# Patient Record
Sex: Female | Born: 1935 | Race: White | Hispanic: No | Smoking: Never smoker
Health system: Southern US, Community
[De-identification: ages and names within clinical notes are randomized; demographics above are authoritative.]

## PROBLEM LIST (undated history)

## (undated) DIAGNOSIS — R42 Dizziness and giddiness: Secondary | ICD-10-CM

## (undated) DIAGNOSIS — G629 Polyneuropathy, unspecified: Secondary | ICD-10-CM

## (undated) DIAGNOSIS — K2970 Gastritis, unspecified, without bleeding: Secondary | ICD-10-CM

## (undated) DIAGNOSIS — M810 Age-related osteoporosis without current pathological fracture: Secondary | ICD-10-CM

## (undated) DIAGNOSIS — R1310 Dysphagia, unspecified: Secondary | ICD-10-CM

## (undated) DIAGNOSIS — B9681 Helicobacter pylori [H. pylori] as the cause of diseases classified elsewhere: Secondary | ICD-10-CM

## (undated) DIAGNOSIS — B3781 Candidal esophagitis: Secondary | ICD-10-CM

## (undated) HISTORY — DX: Helicobacter pylori (H. pylori) as the cause of diseases classified elsewhere: B96.81

## (undated) HISTORY — PX: PARTIAL HYSTERECTOMY: SHX80

## (undated) HISTORY — DX: Candidal esophagitis: B37.81

## (undated) HISTORY — DX: Polyneuropathy, unspecified: G62.9

## (undated) HISTORY — DX: Dizziness and giddiness: R42

## (undated) HISTORY — DX: Age-related osteoporosis without current pathological fracture: M81.0

## (undated) HISTORY — DX: Gastritis, unspecified, without bleeding: K29.70

---

## 2000-01-02 ENCOUNTER — Ambulatory Visit (HOSPITAL_COMMUNITY): Admission: RE | Admit: 2000-01-02 | Discharge: 2000-01-02 | Payer: Self-pay | Admitting: Pulmonary Disease

## 2000-08-13 ENCOUNTER — Encounter: Admission: RE | Admit: 2000-08-13 | Discharge: 2000-08-13 | Payer: Self-pay | Admitting: Pulmonary Disease

## 2001-04-03 ENCOUNTER — Ambulatory Visit (HOSPITAL_COMMUNITY): Admission: RE | Admit: 2001-04-03 | Discharge: 2001-04-03 | Payer: Self-pay | Admitting: Pulmonary Disease

## 2001-08-05 ENCOUNTER — Ambulatory Visit (HOSPITAL_COMMUNITY): Admission: RE | Admit: 2001-08-05 | Discharge: 2001-08-05 | Payer: Self-pay | Admitting: Pulmonary Disease

## 2002-09-09 ENCOUNTER — Ambulatory Visit (HOSPITAL_COMMUNITY): Admission: RE | Admit: 2002-09-09 | Discharge: 2002-09-09 | Payer: Self-pay | Admitting: Pulmonary Disease

## 2004-08-31 ENCOUNTER — Ambulatory Visit (HOSPITAL_COMMUNITY): Admission: RE | Admit: 2004-08-31 | Discharge: 2004-08-31 | Payer: Self-pay | Admitting: Pulmonary Disease

## 2005-09-08 ENCOUNTER — Ambulatory Visit (HOSPITAL_COMMUNITY): Admission: RE | Admit: 2005-09-08 | Discharge: 2005-09-08 | Payer: Self-pay | Admitting: Pulmonary Disease

## 2006-09-26 ENCOUNTER — Ambulatory Visit (HOSPITAL_COMMUNITY): Admission: RE | Admit: 2006-09-26 | Discharge: 2006-09-26 | Payer: Self-pay | Admitting: Pulmonary Disease

## 2007-10-16 ENCOUNTER — Ambulatory Visit (HOSPITAL_COMMUNITY): Admission: RE | Admit: 2007-10-16 | Discharge: 2007-10-16 | Payer: Self-pay | Admitting: Pulmonary Disease

## 2008-07-15 ENCOUNTER — Ambulatory Visit (HOSPITAL_COMMUNITY): Admission: RE | Admit: 2008-07-15 | Discharge: 2008-07-15 | Payer: Self-pay | Admitting: Pulmonary Disease

## 2008-12-28 ENCOUNTER — Ambulatory Visit (HOSPITAL_COMMUNITY): Admission: RE | Admit: 2008-12-28 | Discharge: 2008-12-28 | Payer: Self-pay | Admitting: Pulmonary Disease

## 2010-06-02 ENCOUNTER — Ambulatory Visit (HOSPITAL_COMMUNITY): Admission: RE | Admit: 2010-06-02 | Discharge: 2010-06-02 | Payer: Self-pay | Admitting: Ophthalmology

## 2010-10-11 ENCOUNTER — Ambulatory Visit (HOSPITAL_COMMUNITY)
Admission: RE | Admit: 2010-10-11 | Discharge: 2010-10-11 | Payer: Self-pay | Source: Home / Self Care | Admitting: Pulmonary Disease

## 2010-12-03 ENCOUNTER — Encounter: Payer: Self-pay | Admitting: General Surgery

## 2011-01-29 LAB — HEMOGLOBIN AND HEMATOCRIT, BLOOD: Hemoglobin: 13.2 g/dL (ref 12.0–15.0)

## 2011-01-29 LAB — BASIC METABOLIC PANEL
BUN: 20 mg/dL (ref 6–23)
Creatinine, Ser: 0.92 mg/dL (ref 0.4–1.2)
GFR calc non Af Amer: 58 mL/min — ABNORMAL LOW (ref >60)

## 2011-10-23 ENCOUNTER — Telehealth: Payer: Self-pay

## 2011-10-23 NOTE — Telephone Encounter (Signed)
LMOM for pt to call. She called back and told Soledad Gerlach she did not want to have colonoscopy at this time, she will call when she is ready.

## 2014-10-09 ENCOUNTER — Other Ambulatory Visit (HOSPITAL_COMMUNITY): Payer: Self-pay | Admitting: Pulmonary Disease

## 2014-10-09 DIAGNOSIS — Z0000 Encounter for general adult medical examination without abnormal findings: Secondary | ICD-10-CM

## 2014-10-12 ENCOUNTER — Ambulatory Visit (HOSPITAL_COMMUNITY)
Admission: RE | Admit: 2014-10-12 | Discharge: 2014-10-12 | Disposition: A | Discharge status: Home / Self Care | Payer: Medicare Other | Source: Ambulatory Visit | Attending: Pulmonary Disease | Admitting: Pulmonary Disease

## 2014-10-12 DIAGNOSIS — Z0000 Encounter for general adult medical examination without abnormal findings: Secondary | ICD-10-CM | POA: Insufficient documentation

## 2015-01-11 ENCOUNTER — Encounter: Payer: Self-pay | Admitting: Gastroenterology

## 2015-01-27 ENCOUNTER — Encounter: Payer: Self-pay | Admitting: Gastroenterology

## 2015-01-27 ENCOUNTER — Other Ambulatory Visit: Payer: Self-pay

## 2015-01-27 ENCOUNTER — Ambulatory Visit (INDEPENDENT_AMBULATORY_CARE_PROVIDER_SITE_OTHER): Payer: Medicare Other | Admitting: Gastroenterology

## 2015-01-27 VITALS — BP 103/59 | HR 81 | Temp 97.0°F | Ht 65.0 in | Wt 124.0 lb

## 2015-01-27 DIAGNOSIS — R1314 Dysphagia, pharyngoesophageal phase: Secondary | ICD-10-CM

## 2015-01-27 NOTE — Patient Instructions (Signed)
We have scheduled you for an upper endoscopy and possible dilation with Dr. Darrick PennaFields in the near future!

## 2015-01-27 NOTE — Progress Notes (Signed)
Primary Care Physician:  Fredirick MaudlinHAWKINS,EDWARD L, MD Primary Gastroenterologist:  Dr. Darrick PennaFields   Chief Complaint  Patient presents with  . Dysphagia    x several months    HPI:   [name] is a 81 y.o. female presenting today at the request of Dr. Juanetta GoslingHawkins secondary to dysphagia.   Sympton onset for about a month. States 4 weeks ago noted worsening solid food dysphagia. Thinks she may have had some symptoms chronically but just ignored it. Feels like in the afternoon, she feels more hoarse if she keeps talking. Feels like her throat is growling in the afternoon. No reflux symptoms. Feels like if she rubs her neck it may go down better. Pill dysphagia noted as well. No odynophagia. Good appetite.    No prior colonoscopy. Declining any further lower GI evaluation. History of chronic constipation, taking prune juice as needed. No rectal bleeding.   States any time she has sedation, her vertigo acts up. Has severe nausea with sedation.    Past Medical History  Diagnosis Date  . Osteoporosis   . Vertigo   . Neuropathy     Past Surgical History  Procedure Laterality Date  . Partial hysterectomy      Current Outpatient Prescriptions  Medication Sig Dispense Refill  . alendronate (FOSAMAX) 70 MG tablet Take 70 mg by mouth once a week.  2  . gabapentin (NEURONTIN) 100 MG capsule 200 mg at bedtime.  11  . meclizine (ANTIVERT) 25 MG tablet Take 25 mg by mouth at bedtime.     No current facility-administered medications for this visit.    Allergies as of 01/27/2015  . (No Known Allergies)    Family History  Problem Relation Age of Onset  . Colon cancer Neg Hx     History   Social History  . Marital Status: Widowed    Spouse Name: N/A  . Number of Children: N/A  . Years of Education: N/A   Occupational History  . Not on file.   Social History Main Topics  . Smoking status: Never Smoker   . Smokeless tobacco: Not on file  . Alcohol Use: No  . Drug Use: Not  on file  . Sexual Activity: Not on file   Other Topics Concern  . Not on file   Social History Narrative  . No narrative on file    Review of Systems: Gen: see HPI CV: Denies chest pain, heart palpitations, peripheral edema, syncope.  Resp: Denies shortness of breath at rest or with exertion. Denies wheezing or cough.  GI: see HPI GU : Denies urinary burning, urinary frequency, urinary hesitancy MS: neuropathy Derm: Denies rash, itching, dry skin Psych: Denies depression, anxiety, memory loss, and confusion Heme: Denies bruising, bleeding, and enlarged lymph nodes.  Physical Exam: BP 103/59 mmHg  Pulse 81  Temp(Src) 97 F (36.1 C) (Oral)  Ht 5\' 5"  (1.651 m)  Wt 124 lb (56.246 kg)  BMI 20.63 kg/m2 General:   Alert and oriented. Pleasant and cooperative. Well-nourished and well-developed.  Head:  Normocephalic and atraumatic. Eyes:  Without icterus, sclera clear and conjunctiva pink.  Ears:  Normal auditory acuity. Nose:  No deformity, discharge,  or lesions. Mouth:  No deformity or lesions, oral mucosa pink.  Lungs:  Clear to auscultation bilaterally. No wheezes, rales, or rhonchi. No distress.  Heart:  S1, S2 present without murmurs appreciated.  Abdomen:  +BS, soft, non-tender and non-distended. No HSM noted. No guarding or rebound. No masses  appreciated.  Rectal:  Deferred  Msk:  Symmetrical without gross deformities. Normal posture. Extremities:  Without clubbing or edema. Neurologic:  Alert and  oriented x4;  grossly normal neurologically. Skin:  Intact without significant lesions or rashes. Psych:  Alert and cooperative. Normal mood and affect.

## 2015-01-29 ENCOUNTER — Telehealth: Payer: Self-pay

## 2015-01-29 NOTE — Telephone Encounter (Signed)
Pt called and had some questions about her procedure being with Dr. Darrick PennaFields.  States that when she made the appointment she was told that Dr. Jena Gaussourk would be doing procedures if she had to have anything done.  Pt is requesting for Dr. Jena Gaussourk to be primary GI. Pt is already scheduled with Dr. Darrick PennaFields on 02/27/15 for EGD. Please Advise

## 2015-01-29 NOTE — Telephone Encounter (Signed)
That is fine. She has not established with either one. If she desires Dr. Jena Gaussourk, put with him. Just let me know so I can change my documentation in her office note.

## 2015-01-29 NOTE — Telephone Encounter (Signed)
She is requesting Rourk so please fix the note.

## 2015-02-01 ENCOUNTER — Telehealth: Payer: Self-pay

## 2015-02-01 ENCOUNTER — Emergency Department (HOSPITAL_COMMUNITY)
Admission: EM | Admit: 2015-02-01 | Discharge: 2015-02-01 | Disposition: A | Discharge status: Home / Self Care | Payer: Medicare Other | Attending: Emergency Medicine | Admitting: Emergency Medicine

## 2015-02-01 ENCOUNTER — Encounter (HOSPITAL_COMMUNITY): Payer: Self-pay | Admitting: Emergency Medicine

## 2015-02-01 DIAGNOSIS — G629 Polyneuropathy, unspecified: Secondary | ICD-10-CM | POA: Diagnosis not present

## 2015-02-01 DIAGNOSIS — R1310 Dysphagia, unspecified: Secondary | ICD-10-CM

## 2015-02-01 DIAGNOSIS — Z8739 Personal history of other diseases of the musculoskeletal system and connective tissue: Secondary | ICD-10-CM | POA: Insufficient documentation

## 2015-02-01 DIAGNOSIS — Z79899 Other long term (current) drug therapy: Secondary | ICD-10-CM | POA: Insufficient documentation

## 2015-02-01 MED ORDER — MECLIZINE HCL 12.5 MG PO TABS
12.5000 mg | ORAL_TABLET | Freq: Once | ORAL | Status: AC
  Administered 2015-02-01: 12.5 mg via ORAL
  Filled 2015-02-01: qty 1

## 2015-02-01 MED ORDER — DIAZEPAM 5 MG PO TABS
5.0000 mg | ORAL_TABLET | Freq: Once | ORAL | Status: AC
  Administered 2015-02-01: 5 mg via ORAL
  Filled 2015-02-01: qty 1

## 2015-02-01 NOTE — ED Notes (Signed)
Upon rounding, pt remains anxious. bp has increased. Airway remains patent.

## 2015-02-01 NOTE — ED Notes (Signed)
Pt reports is scheduled to have esophagus stretched in April. Pt reports was trying to eat lunch today and pt reports "it wont go down." airway patent. Moderate anxiety noted in triage. Pt family also reports chronic inner ear.

## 2015-02-01 NOTE — ED Notes (Signed)
Pt very anxious at this time.  States that she was eating soup for lunch and feels like it is stuck in her throat.  Hyperverbal and hyperventilating.  Pt calmed down when coached through deep breathing.  Denies any trouble breathing and states that she is able to swallow saliva at this time.

## 2015-02-01 NOTE — Telephone Encounter (Signed)
Patients son called(Jerry). States that the pt wants to have procedure moved up.  States that pt is having labored breathing and is complaining about food not going down. Spoke with Gerrit HallsAnna Sams via phone and she states that pt needs to go to ER if problem is creating a breathing issue.  Son states that he also thought that she needed to go the ER before he called the office.   Procedure has been rescheduled to 02/19/2015.

## 2015-02-01 NOTE — Telephone Encounter (Signed)
Yes she is. Thanks and sorry for any confusion

## 2015-02-01 NOTE — Telephone Encounter (Signed)
Just verifying: she is staying with Dr. Darrick PennaFields, correct?

## 2015-02-01 NOTE — Discharge Instructions (Signed)

## 2015-02-02 NOTE — Progress Notes (Signed)
cc'ed to pcp °

## 2015-02-02 NOTE — Assessment & Plan Note (Signed)
81 year old female with worsening food and pill dysphagia without typical reflux symptoms. Query web, ring, stricture. May have underlying motility disorder as well. No prior EGD.   Proceed with upper endoscopy and dilation in the near future with Dr. Darrick PennaFields. The risks, benefits, and alternatives have been discussed in detail with patient. They have stated understanding and desire to proceed.  Patient notes significant nausea with history of sedation. Zofran 4 mg IV on call for procedure.

## 2015-02-03 ENCOUNTER — Telehealth: Payer: Self-pay | Admitting: Gastroenterology

## 2015-02-03 NOTE — Telephone Encounter (Signed)
PATIENT CALLED TO SEE IF SHE COULD MOVE HER ENDO EARLIER  PLEASE CALL [phone]

## 2015-02-03 NOTE — Telephone Encounter (Signed)
Called pt and offered and earlier date on 02/15/2015 @ 1:30pm but pt refused.

## 2015-02-06 NOTE — ED Provider Notes (Signed)
CSN: [id number]     Arrival date & time 02/01/15  1641 History   First MD Initiated Contact with Patient 02/01/15 1919     Chief Complaint  Patient presents with  . Dysphagia     (Consider location/radiation/quality/duration/timing/severity/associated sxs/prior Treatment) HPI   81 year old female with dysphasia. Ongoing symptoms for the past several weeks. Today she is trying eat lunch and reports that "nothing would go down." Persistent symptoms since then. Denies any pain. No shortness of breath. Reports upcoming GI appointment for further evaluation. NO coughing. No nausea.   Past Medical History  Diagnosis Date  . Osteoporosis   . Vertigo   . Neuropathy    Past Surgical History  Procedure Laterality Date  . Partial hysterectomy     Family History  Problem Relation Age of Onset  . Colon cancer Neg Hx    History  Substance Use Topics  . Smoking status: Never Smoker   . Smokeless tobacco: Not on file  . Alcohol Use: No   OB History    No data available     Review of Systems  All systems reviewed and negative, other than as noted in HPI.   Allergies  Review of patient's allergies indicates no known allergies.  Home Medications   Prior to Admission medications   Medication Sig Start Date End Date Taking? Authorizing Provider  alendronate (FOSAMAX) 70 MG tablet Take 70 mg by mouth once a week. 12/11/14   Historical Provider, MD  gabapentin (NEURONTIN) 100 MG capsule 200 mg at bedtime. 01/01/15   Historical Provider, MD  meclizine (ANTIVERT) 25 MG tablet Take 25 mg by mouth at bedtime.    Historical Provider, MD   BP 104/81 mmHg  Pulse 83  Temp(Src) 98.2 F (36.8 C) (Oral)  Resp 24  Ht 5\' 5"  (1.651 m)  Wt 124 lb (56.246 kg)  BMI 20.63 kg/m2  SpO2 95% Physical Exam  Constitutional: She appears well-developed and well-nourished. No distress.  HENT:  Head: Normocephalic and atraumatic.  Mouth/Throat: Oropharynx is clear and moist. No oropharyngeal exudate.   Eyes: Conjunctivae are normal. Pupils are equal, round, and reactive to light. Right eye exhibits no discharge. Left eye exhibits no discharge.  Neck: Neck supple.  Cardiovascular: Normal rate, regular rhythm and normal heart sounds.  Exam reveals no gallop and no friction rub.   No murmur heard. Pulmonary/Chest: Effort normal and breath sounds normal. No respiratory distress.  Abdominal: Soft. She exhibits no distension. There is no tenderness.  Musculoskeletal: She exhibits no edema or tenderness.  Lymphadenopathy:    She has no cervical adenopathy.  Neurological: She is alert.  Skin: Skin is warm and dry.  Psychiatric: She has a normal mood and affect. Her behavior is normal. Thought content normal.  Nursing note and vitals reviewed.   ED Course  Procedures (including critical care time) Labs Review Labs Reviewed - No data to display  Imaging Review No results found.   EKG Interpretation None      MDM   Final diagnoses:  Dysphagia   81 year old female with dysphasia. Suspect surrounding sciatic component. She is tolerating crackers and ginger ale emergency room. She denies any pain. No respiratory distress.    Raeford RazorStephen Shihab States, MD 02/06/15 2320

## 2015-02-12 DIAGNOSIS — B3781 Candidal esophagitis: Secondary | ICD-10-CM

## 2015-02-12 DIAGNOSIS — B9681 Helicobacter pylori [H. pylori] as the cause of diseases classified elsewhere: Secondary | ICD-10-CM

## 2015-02-12 HISTORY — DX: Candidal esophagitis: B37.81

## 2015-02-12 HISTORY — DX: Helicobacter pylori (H. pylori) as the cause of diseases classified elsewhere: B96.81

## 2015-02-19 ENCOUNTER — Ambulatory Visit (HOSPITAL_COMMUNITY)
Admission: RE | Admit: 2015-02-19 | Discharge: 2015-02-19 | Disposition: A | Discharge status: Home / Self Care | Payer: Medicare Other | Source: Ambulatory Visit | Attending: Gastroenterology | Admitting: Gastroenterology

## 2015-02-19 ENCOUNTER — Encounter (HOSPITAL_COMMUNITY)
Admission: RE | Disposition: A | Discharge status: Home / Self Care | Payer: Self-pay | Source: Ambulatory Visit | Attending: Gastroenterology

## 2015-02-19 ENCOUNTER — Encounter (HOSPITAL_COMMUNITY): Payer: Self-pay | Admitting: *Deleted

## 2015-02-19 DIAGNOSIS — M810 Age-related osteoporosis without current pathological fracture: Secondary | ICD-10-CM | POA: Insufficient documentation

## 2015-02-19 DIAGNOSIS — K228 Other specified diseases of esophagus: Secondary | ICD-10-CM | POA: Insufficient documentation

## 2015-02-19 DIAGNOSIS — B9681 Helicobacter pylori [H. pylori] as the cause of diseases classified elsewhere: Secondary | ICD-10-CM | POA: Insufficient documentation

## 2015-02-19 DIAGNOSIS — K209 Esophagitis, unspecified: Secondary | ICD-10-CM | POA: Diagnosis not present

## 2015-02-19 DIAGNOSIS — K2970 Gastritis, unspecified, without bleeding: Secondary | ICD-10-CM | POA: Diagnosis not present

## 2015-02-19 DIAGNOSIS — R1310 Dysphagia, unspecified: Secondary | ICD-10-CM | POA: Diagnosis present

## 2015-02-19 DIAGNOSIS — K2950 Unspecified chronic gastritis without bleeding: Secondary | ICD-10-CM | POA: Diagnosis not present

## 2015-02-19 DIAGNOSIS — R1314 Dysphagia, pharyngoesophageal phase: Secondary | ICD-10-CM | POA: Diagnosis not present

## 2015-02-19 DIAGNOSIS — R634 Abnormal weight loss: Secondary | ICD-10-CM | POA: Diagnosis not present

## 2015-02-19 HISTORY — PX: ESOPHAGOGASTRODUODENOSCOPY: SHX5428

## 2015-02-19 HISTORY — PX: ESOPHAGEAL DILATION: SHX303

## 2015-02-19 SURGERY — EGD (ESOPHAGOGASTRODUODENOSCOPY)
Anesthesia: Moderate Sedation

## 2015-02-19 MED ORDER — STERILE WATER FOR IRRIGATION IR SOLN
Status: DC | PRN
  Administered 2015-02-19: 11:00:00

## 2015-02-19 MED ORDER — MEPERIDINE HCL 100 MG/ML IJ SOLN
INTRAMUSCULAR | Status: DC | PRN
  Administered 2015-02-19 (×2): 25 mg via INTRAVENOUS

## 2015-02-19 MED ORDER — MIDAZOLAM HCL 5 MG/5ML IJ SOLN
INTRAMUSCULAR | Status: DC | PRN
  Administered 2015-02-19 (×3): 1 mg via INTRAVENOUS

## 2015-02-19 MED ORDER — MIDAZOLAM HCL 5 MG/5ML IJ SOLN
INTRAMUSCULAR | Status: AC
  Filled 2015-02-19: qty 10

## 2015-02-19 MED ORDER — LIDOCAINE VISCOUS 2 % MT SOLN
OROMUCOSAL | Status: DC | PRN
  Administered 2015-02-19: 4 mL via OROMUCOSAL

## 2015-02-19 MED ORDER — SODIUM CHLORIDE 0.9 % IV SOLN
INTRAVENOUS | Status: DC
  Administered 2015-02-19: 10:00:00 via INTRAVENOUS

## 2015-02-19 MED ORDER — LIDOCAINE VISCOUS 2 % MT SOLN
OROMUCOSAL | Status: AC
  Filled 2015-02-19: qty 15

## 2015-02-19 MED ORDER — MEPERIDINE HCL 100 MG/ML IJ SOLN
INTRAMUSCULAR | Status: AC
  Filled 2015-02-19: qty 2

## 2015-02-19 MED ORDER — ONDANSETRON HCL 4 MG/2ML IJ SOLN
4.0000 mg | Freq: Once | INTRAMUSCULAR | Status: AC
  Administered 2015-02-19: 4 mg via INTRAVENOUS
  Filled 2015-02-19: qty 2

## 2015-02-19 MED ORDER — MINERAL OIL PO OIL
TOPICAL_OIL | ORAL | Status: AC
  Filled 2015-02-19: qty 30

## 2015-02-19 NOTE — Discharge Instructions (Signed)
I dilated your esophagus. You have a stricture near the TOP of your esophagus.  You have mild gastritis. I biopsied your ESOPHAGUS AND stomach.   YOUR BIOPSY RESULTS WILL BE AVAILABLE IN MY CHART AFTER APR 12 AND MY OFFICE WILL CONTACT YOU IN 10-14 DAYS WITH YOUR RESULTS.   FOLLOW UP IN 3 MOS.  APPOINTMENT  May 21, 2015 AT 10:00 AM, WITH Gerrit HallsANNA SAMS, NP  UPPER ENDOSCOPY AFTER CARE Read the instructions outlined below and refer to this sheet in the next week. These discharge instructions provide you with general information on caring for yourself after you leave the hospital. While your treatment has been planned according to the most current medical practices available, unavoidable complications occasionally occur. If you have any problems or questions after discharge, call DR. FIELDS, [phone].  ACTIVITY  You may resume your regular activity, but move at a slower pace for the next 24 hours.   Take frequent rest periods for the next 24 hours.   Walking will help get rid of the air and reduce the bloated feeling in your belly (abdomen).   No driving for 24 hours (because of the medicine (anesthesia) used during the test).   You may shower.   Do not sign any important legal documents or operate any machinery for 24 hours (because of the anesthesia used during the test).    NUTRITION  Drink plenty of fluids.   You may resume your normal diet as instructed by your doctor.   Begin with a light meal and progress to your normal diet. Heavy or fried foods are harder to digest and may make you feel sick to your stomach (nauseated).   Avoid alcoholic beverages for 24 hours or as instructed.    MEDICATIONS  You may resume your normal medications.   WHAT YOU CAN EXPECT TODAY  Some feelings of bloating in the abdomen.   Passage of more gas than usual.    IF YOU HAD A BIOPSY TAKEN DURING THE UPPER ENDOSCOPY:  Eat a soft diet IF YOU HAVE NAUSEA, BLOATING, ABDOMINAL PAIN, OR  VOMITING.    FINDING OUT THE RESULTS OF YOUR TEST Not all test results are available during your visit. DR. Darrick PennaFIELDS WILL CALL YOU WITHIN 14 DAYS OF YOUR PROCEDUE WITH YOUR RESULTS. Do not assume everything is normal if you have not heard from DR. FIELDS, CALL HER OFFICE AT [phone].  SEEK IMMEDIATE MEDICAL ATTENTION AND CALL THE OFFICE: [phone] IF:  You have more than a spotting of blood in your stool.   Your belly is swollen (abdominal distention).   You are nauseated or vomiting.   You have a temperature over 101F.   You have abdominal pain or discomfort that is severe or gets worse throughout the day.  Gastritis  Gastritis is an inflammation (the body's way of reacting to injury and/or infection) of the stomach. It is often caused by viral or bacterial (germ) infections. It can also be caused BY ASPIRIN, BC/GOODY POWDER'S, (IBUPROFEN) MOTRIN, OR ALEVE (NAPROXEN), chemicals (including alcohol), SPICY FOODS, and medications. This illness may be associated with generalized malaise (feeling tired, not well), UPPER ABDOMINAL STOMACH cramps, and fever. One common bacterial cause of gastritis is an organism known as H. Pylori. This can be treated with antibiotics.   ESOPHAGEAL STRICTURE  Esophageal strictures can be caused by stomach acid backing up into the tube that carries food from the mouth down to the stomach (lower esophagus).  TREATMENT There are a number of non-prescription medicines  used to treat reflux/stricture, including: Antacids.  ZANTAC OR PEPCID  HOME CARE INSTRUCTIONS Eat 2-3 hours before going to bed.  Try to reach and maintain a healthy weight.  Do not eat just a few very large meals. Instead, eat 4 TO 6 smaller meals throughout the day.  Try to identify foods and beverages that make your symptoms worse, and avoid these.  Avoid tight clothing.  Do not exercise right after eating.

## 2015-02-19 NOTE — Op Note (Signed)
[facility] [address] [place], [id number]   ENDOSCOPY PROCEDURE REPORT  PATIENT: Nancy Horn, Nancy Horn  MR#: [id number] BIRTHDATE: 13-Jan-1925 , 89  yrs. old GENDER: female  ENDOSCOPIST: West BaliSandi L Dolorez Jeffrey, MD REFFERED [id number]:Edward Juanetta GoslingHawkins, M.D.  PROCEDURE DATE:  02/19/2015 PROCEDURE:   EGD with biopsy and EGD with dilatation over guidewire   INDICATIONS:1.  dysphagia.   2.  weight loss. MEDICATIONS: Demerol 50 mg IV and Versed 3 mg IV TOPICAL ANESTHETIC: Viscous Xylocaine  DESCRIPTION OF PROCEDURE:   After the risks benefits and alternatives of the procedure were thoroughly explained, informed consent was obtained.  The [id number] ([id number]  endoscope was introduced through the mouth and advanced to the second portion of the duodenum. The instrument was slowly withdrawn as the mucosa was carefully examined.  Prior to withdrawal of the scope, the guidwire was placed.  The esophagus was dilated successfully.  The patient was recovered in endoscopy and discharged home in satisfactory condition.   ESOPHAGUS: DILATED PROXIMAL ESOPHAGUS.  SEGMENT IS 5 CM LONG.  COLD BIOPSIES OBTAINED.   POSSIBLE MID-ESOPHAGEAL WEB.  NO MASS DETECTED.  NORMAL GE JUNCTION.   STOMACH: Mild non-erosive gastritis (inflammation) was found in the gastric antrum.  Multiple biopsies were performed.   DUODENUM: The duodenal mucosa showed no abnormalities in the bulb and second portion of the duodenum. Dilation was then performed at the gastroesphageal junction Dilator: Savary over guidewire Size(s): 12-16 MM Resistance: minimal Heme: yes  COMPLICATIONS: There were no immediate complications.  ENDOSCOPIC IMPRESSION: 1.   DILATED PROXIMAL ESOPHAGUS. 2.   POSSIBLE MID-ESOPHAGEAL WEB. 3.   MILD Non-erosive gastritis  RECOMMENDATIONS: AWAIT BIOPSY. FOLLOW UP IN 3 MOS.   eSigned:  West BaliSandi L Stefanos Haynesworth, MD 02/19/2015 3:15 PM   CPT CODES: ICD CODES:  The ICD and CPT codes recommended by this  software are interpretations from the data that the clinical staff has captured with the software.  The verification of the translation of this report to the ICD and CPT codes and modifiers is the sole responsibility of the health care institution and practicing physician where this report was generated.  [employer] will not be held responsible for the validity of the ICD and CPT codes included on this report.  AMA assumes no liability for data contained or not contained herein. CPT is a Publishing rights managerregistered trademark of the [employer].

## 2015-02-19 NOTE — H&P (Signed)
  Primary Care Physician:  Fredirick MaudlinHAWKINS,EDWARD L, MD Primary Gastroenterologist:  Dr. Darrick PennaFields  Pre-Procedure History & Physical: HPI:  Nancy RakeDorothy P Horn is a 81 y.o. female here for DYSPHAGIA.  Past Medical History  Diagnosis Date  . Osteoporosis   . Vertigo   . Neuropathy     Past Surgical History  Procedure Laterality Date  . Partial hysterectomy      Prior to Admission medications   Medication Sig Start Date End Date Taking? Authorizing Provider  alendronate (FOSAMAX) 70 MG tablet Take 70 mg by mouth once a week. 12/11/14  Yes Historical Provider, MD  gabapentin (NEURONTIN) 100 MG capsule 200 mg at bedtime. 01/01/15  Yes Historical Provider, MD  meclizine (ANTIVERT) 25 MG tablet Take 25 mg by mouth at bedtime.   Yes Historical Provider, MD    Allergies as of 01/27/2015  . (No Known Allergies)    Family History  Problem Relation Age of Onset  . Colon cancer Neg Hx     History   Social History  . Marital Status: Widowed    Spouse Name: N/A  . Number of Children: N/A  . Years of Education: N/A   Occupational History  . Not on file.   Social History Main Topics  . Smoking status: Never Smoker   . Smokeless tobacco: Not on file  . Alcohol Use: No  . Drug Use: No  . Sexual Activity: Not on file   Other Topics Concern  . Not on file   Social History Narrative    Review of Systems: See HPI, otherwise negative ROS   Physical Exam: BP 137/65 mmHg  Pulse 80  Temp(Src) 97.3 F (36.3 C) (Oral)  Resp 24  Ht 5\' 5"  (1.651 m)  Wt 124 lb (56.246 kg)  BMI 20.63 kg/m2  SpO2 98% General:   Alert,  pleasant and cooperative in NAD Head:  Normocephalic and atraumatic. Neck:  Supple; Lungs:  Clear throughout to auscultation.    Heart:  Regular rate and rhythm. Abdomen:  Soft, nontender and nondistended. Normal bowel sounds, without guarding, and without rebound.   Neurologic:  Alert and  oriented x4;  grossly normal neurologically.  Impression/Plan:      DYSPHAGIA  PLAN:  EGD/DIL TODAY

## 2015-02-19 NOTE — Progress Notes (Signed)
REVIEWED-NO ADDITIONAL RECOMMENDATIONS. 

## 2015-02-23 ENCOUNTER — Encounter (HOSPITAL_COMMUNITY): Payer: Self-pay | Admitting: Gastroenterology

## 2015-02-25 ENCOUNTER — Telehealth: Payer: Self-pay | Admitting: General Practice

## 2015-02-25 ENCOUNTER — Encounter: Payer: Self-pay | Admitting: General Practice

## 2015-02-25 MED ORDER — FLUCONAZOLE 100 MG PO TABS: ORAL_TABLET | ORAL | Status: DC

## 2015-02-25 MED ORDER — PANTOPRAZOLE SODIUM 40 MG PO TBEC
DELAYED_RELEASE_TABLET | ORAL | Status: DC
Start: 2015-02-25 — End: 2015-03-31

## 2015-02-25 NOTE — Telephone Encounter (Signed)
I spoke to pt. She had EGD on 02/19/2015 by Dr. Darrick PennaFields.  She is aware we call with results in 10-14 days, but said "I do not want to wait 10-14 days for results/recommendations.  She is having problems with saliva in her mouth when she talks a lot, her mouth gets full.( said this was a fairly new problem).  She was speaking to a relative who mentioned the flap and she wants to know if there is anything wrong with her flap.  She would like to know if she will ALWAYS need to eat small portions. Said she has no appetite and food just does not appeal to her. She is concerned that she will be losing weight if she cannot eat much.    She ask if she should take an antacid. Said she has a lot of gas. She is also constipated now and just does a little bit everyday. Tries drinking prune juice some, but feels she needs an enema.   She told Nancy Horn that she wanted to speak to Dr. Darrick PennaFields now, and she told her Dr. Darrick PennaFields is with patients.  Pt is aware I will call her when I get the message from Dr. Darrick PennaFields.

## 2015-02-25 NOTE — Telephone Encounter (Signed)
Routing to Nancy Horn, patient feels like she is having complications from her procedure

## 2015-02-25 NOTE — Telephone Encounter (Signed)
I LMOM to call. ( Rx for the [employer] printed and I faxed it to [facility]).

## 2015-02-25 NOTE — Telephone Encounter (Addendum)
PLEASE CALL PT. HER ESOPHAGUS BIOPSIES SHOWS CANDIDA ESOPHAGITIS. SHE HAS A YEATS INFECTION IN HER ESOPHAGUS THAT IS CAUSING HER TO HAVE TROUBLE SWALLOWING. SHE NEEDS DIFLUCAN 2 FOR THE FIRST DOSE THEN ONE DAILY FOR 21 DAYS. SHE NEEDS TO FOLLOW A SOFT MECHANICAL DIET UNTIL HER FOLLOW UP APPT. SHE NEEDS TO START PROTONIX BID.   SHE SHOULD TAKE MILK OF MAGNESIA LIQUID OR PILLS TWICE DAILY TO TREAT HER CONSTIPATION.  Her stomach Bx showed H. 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 AFTER SHE FINISHES THE DIFLUCAN, SHE NEEDS TO CALL US AND WE WILL SEND HER RX FOR ABX FOR THE BACTERIAL INFECTION. THE MEDICINES TO TREAT H PYLORI AND CANDIDA ESOPHAGITIS CAN'T BE TAKEN TOGETHER. HER APPETITE AND GAS WILL GET BETTER AFTER BOTH INFECTIONS ARE TREATED.

## 2015-02-25 NOTE — Addendum Note (Signed)
Addended by: West BaliFIELDS, Kaiyon Hynes L on: 02/25/2015 03:24 PM   Modules accepted: Orders

## 2015-02-26 NOTE — Telephone Encounter (Signed)
I called and informed the pt. She is aware she also has the H Pylori and will call back after she completes the Diflucan. I also have a reminder to call her about that time also.

## 2015-03-09 ENCOUNTER — Telehealth: Payer: Self-pay | Admitting: Gastroenterology

## 2015-03-09 NOTE — Telephone Encounter (Signed)
Patient called and asked if the nurse would call her back because she is still having problems since having her procedure on 02/19/15 and has questions she wouldn't to ask her. Please call [phone]

## 2015-03-09 NOTE — Telephone Encounter (Signed)
I called pt and she is still complaining with her throat filling up quickly and it sounds like she is hoarse.  I went over all of her instructions from 02/25/2015 with her .She is taking the Diflucan for the candida esophagitis. I asked her if she is following a soft mechanical diet and she said she is trying to although she did have a chicken tender yesterday with mashed potatoes.   She will come by the office for a soft mechanical diet which I am placing at front for her.   She will call when she completes Diflucan so I can give her the instructions for her H Pylori treatment. I also have a note for me to call her.   Fyi to Gerrit HallsAnna sams, NP in Dr. Darrick PennaFields absence.

## 2015-03-10 NOTE — Telephone Encounter (Signed)
Noted. Follow the strict mechanical diet. Continue Diflucan as you instructed. Agree.

## 2015-03-16 NOTE — Telephone Encounter (Signed)
I spoke to the pt today. She has 3 more Diflucan tablets to take 9 one for today, Wed and Thursday. She will be ready to start the H Pylori treatment on Friday. OK to send the med to her pharmacy.   She is still very discouraged. Said she is taking the Protonix bid and staying on a soft mechanical diet.  She feels like she is so full all of the time and has her mouth filling up a lot when she talks. She would like to know what Dr.Fields advise is for that.

## 2015-03-17 MED ORDER — AMOXICILLIN 500 MG PO TABS: ORAL_TABLET | ORAL | Status: DC

## 2015-03-17 MED ORDER — CLARITHROMYCIN 500 MG PO TABS: ORAL_TABLET | ORAL | Status: DC

## 2015-03-17 NOTE — Addendum Note (Signed)
Addended by: West BaliFIELDS, Joelyn Lover L on: 03/17/2015 12:17 PM   Modules accepted: Orders

## 2015-03-17 NOTE — Telephone Encounter (Addendum)
PLEASE CALL PT. SHE MAY FEEL FULL ALL THE TIME BECAUSE HER H PYLORI IS NOT TREATED SHE SHOULD COMPLETE HER ABX AND CALL 2 WEEKS AFTER THEY ARE COMPLETE.   I AM NOT SURE WHY SHE FEELS LIKE MOUTH FILLS UP. SHE SHOULD TALK TO HER DENTIST ABOUT THAT.

## 2015-03-17 NOTE — Telephone Encounter (Signed)
PLEASE CALL PT. IF SHE IS NOT HAVING TROUBLE SWALLOWING THEN IT IS OK TO TAKE FOSAMAX. SHE SHOULD FOLLOW ALL PRECAUTIONS WHEN TAKING FOSAMAX.

## 2015-03-17 NOTE — Telephone Encounter (Addendum)
PLEASE CALL PT. RX SENT. START MAY 6. She needs AMOXICILLIN 500 mg 2 po BID for 10 days and Biaxin 500 mg po bid for 10 days. She SHOULD TAKE PROTONIX BID UNTIL JUL 2016 THEN then 1 po DAILY. Med side effects include NVD, abd pain, and metallic taste.

## 2015-03-17 NOTE — Telephone Encounter (Signed)
Pt called and states that she received her fosamax in the mail and began reading the warnings and side effects.  States that the fosamax has a warning about not taking it if you have esophageal problems. Pt states she is confused and wants to make sure Tobi Bastosnna is aware.

## 2015-03-17 NOTE — Telephone Encounter (Signed)
Please send RX to the pharmacy for the H Pylori.

## 2015-03-17 NOTE — Telephone Encounter (Signed)
Pt is aware. She also said she had her Fosamax refilled and she noticed on the pamphlet that if you have problems with your esophagus, you should not take it. She said that Dr. Juanetta GoslingHawkins prescribed that for her and I told her she should discuss her concerns about the Fosamax with Dr. Juanetta GoslingHawkins.

## 2015-03-17 NOTE — Telephone Encounter (Signed)
Pt is aware.  

## 2015-03-25 ENCOUNTER — Telehealth: Payer: Self-pay

## 2015-03-25 NOTE — Telephone Encounter (Signed)
Pt called and said she is scheduled for follow up here in July with Gerrit HallsAnna Sams, NP. She would like to cancel that appt and make one to follow up with Dr. Darrick PennaFields. Please schedule and let pt know. Thanks!

## 2015-03-25 NOTE — Telephone Encounter (Signed)
PUT PATIENT ON RECALL FOR July WITH [facility] ONLY, PATIENT AWARE

## 2015-03-30 ENCOUNTER — Telehealth: Payer: Self-pay | Admitting: Gastroenterology

## 2015-03-30 NOTE — Telephone Encounter (Signed)
Patient called today to say that she has taken her protonix 40 mg and doesn't feel like it has helped her any. She said that she feels like her throat is "filling up" the more she talks. She would like for the nurse to call her back. [phone]

## 2015-03-30 NOTE — Telephone Encounter (Signed)
I called pt and she has taken the antibiotics. She said she does not feel the Protonix is helping her. She feels like the saliva is in excess in her throat. She also feels that the Protonix gets stuck sometimes. She is eating soft foods.  Please advise!

## 2015-03-31 MED ORDER — LANSOPRAZOLE 30 MG PO TBDP: ORAL_TABLET | ORAL | Status: DC

## 2015-03-31 NOTE — Telephone Encounter (Signed)
Pt is aware. OK to schedule the swallowing test.

## 2015-03-31 NOTE — Addendum Note (Signed)
Addended by: West BaliFIELDS, Nancie Bocanegra L on: 03/31/2015 01:48 PM   Modules accepted: Orders, Medications

## 2015-03-31 NOTE — Telephone Encounter (Signed)
PLEASE CALL PT. IF SHE FEELS LIKE HER THROAT IS FILLING UP WHEN SHE TALKS, SHE SHOULD HAVE A MODIFIED BARIUM SWALLOW TO SEE IF SHE HAS TROUBLE GETTING LIQUIDS INTO HER ESOPHAGUS. SHE CAN CHANGE FROM PROTONIX TO PREVACID SOLUTABS BID 30 MINS PRIOR TO MEALS.

## 2015-03-31 NOTE — Telephone Encounter (Signed)
LMOM to call.

## 2015-04-01 ENCOUNTER — Other Ambulatory Visit: Payer: Self-pay

## 2015-04-01 DIAGNOSIS — R1314 Dysphagia, pharyngoesophageal phase: Secondary | ICD-10-CM

## 2015-04-01 NOTE — Telephone Encounter (Signed)
Referral to speech for Modified Barium swallow has been made

## 2015-04-05 ENCOUNTER — Other Ambulatory Visit: Payer: Self-pay | Admitting: Gastroenterology

## 2015-04-05 ENCOUNTER — Telehealth: Payer: Self-pay

## 2015-04-05 DIAGNOSIS — R1310 Dysphagia, unspecified: Secondary | ICD-10-CM

## 2015-04-05 NOTE — Telephone Encounter (Signed)
Tried to do a PA for prevacid solutab. It was denied by her insurance. They will only pay for omeprazole qd and pantoprazole qd. They will not pay for it bid. Pt currently has pantoprazole at home and is going to take it qd unless she hears back from us. Do you want to change her to omeprazole?

## 2015-04-06 ENCOUNTER — Ambulatory Visit (HOSPITAL_COMMUNITY): Payer: Medicare Other | Attending: Gastroenterology | Admitting: Speech Pathology

## 2015-04-06 ENCOUNTER — Telehealth: Payer: Self-pay

## 2015-04-06 ENCOUNTER — Ambulatory Visit (HOSPITAL_COMMUNITY)
Admission: RE | Admit: 2015-04-06 | Discharge: 2015-04-06 | Disposition: A | Discharge status: Home / Self Care | Payer: Medicare Other | Source: Ambulatory Visit | Attending: Gastroenterology | Admitting: Gastroenterology

## 2015-04-06 DIAGNOSIS — R1314 Dysphagia, pharyngoesophageal phase: Secondary | ICD-10-CM | POA: Insufficient documentation

## 2015-04-06 DIAGNOSIS — K224 Dyskinesia of esophagus: Secondary | ICD-10-CM | POA: Diagnosis not present

## 2015-04-06 DIAGNOSIS — R1310 Dysphagia, unspecified: Secondary | ICD-10-CM

## 2015-04-06 MED ORDER — OMEPRAZOLE 20 MG PO CPDR: 20.0000 mg | DELAYED_RELEASE_CAPSULE | Freq: Every day | ORAL | Status: DC

## 2015-04-06 NOTE — Telephone Encounter (Signed)
Appears there is two issues.   She needed BID for the H.pylori treatment, has she even completed this? We can switch her to omeprazole 20mg  (she can empty contents of capsule into applesauce and swallow if she is having trouble with swallow pills). We can try BID but if not covered by insurance then she may have to supplement with over the counter omeprazole to get total of BID.

## 2015-04-06 NOTE — Telephone Encounter (Signed)
03/30/15 telephone note. [facility] changed pt to prevacid solutab.

## 2015-04-06 NOTE — Telephone Encounter (Signed)
Raynelle FanningJulie, this lady has a lot of correspondence in the note which I briefly reviewed. I have never seen her before. Help me figure out where the prevacid solutab came into play.

## 2015-04-06 NOTE — Telephone Encounter (Signed)
Pt has finished her hpylori medication. She is aware of new rx but doesn't want to start it yet until she gets her results from [facility] about the swallowing test.  Routing to Dr.Fields for test results.

## 2015-04-06 NOTE — Telephone Encounter (Signed)
Daphey with the speech department call wanting to talk with [facility] about this patients barium swallow test. I inform her that [facility] was out of town but I would pay it on to [facility]. She would like for someone to call her on her cell phone [phone])

## 2015-04-07 NOTE — Telephone Encounter (Signed)
Attempted to call but had to leave message.

## 2015-04-08 ENCOUNTER — Other Ambulatory Visit: Payer: Self-pay

## 2015-04-08 DIAGNOSIS — R1314 Dysphagia, pharyngoesophageal phase: Secondary | ICD-10-CM

## 2015-04-08 NOTE — Telephone Encounter (Signed)
Pt is set up for BPE on 04/15/15 @ 915. LMOM for her to call us back

## 2015-04-08 NOTE — Telephone Encounter (Signed)
Discussed with Nancy Horn regarding results. Patient with impressively dilated esophagus, distended and full of air prior even prior to beginning MBSS. I was not able to speak to Dr. Kearney Hardover, who was present for this study, but I did speak with Dr. Rosalene Billingsegister. The best way to further evaluate her anatomy would be with a BPE.   Please schedule BPE. Thanks!

## 2015-04-09 NOTE — Telephone Encounter (Signed)
Pt could no do the 04/15/15 so her new appointment is 04/13/15 @ 9:00. She is aware

## 2015-04-09 NOTE — Telephone Encounter (Signed)
Open in error

## 2015-04-13 ENCOUNTER — Ambulatory Visit (HOSPITAL_COMMUNITY)
Admission: RE | Admit: 2015-04-13 | Discharge: 2015-04-13 | Disposition: A | Discharge status: Home / Self Care | Payer: Medicare Other | Source: Ambulatory Visit | Attending: Gastroenterology | Admitting: Gastroenterology

## 2015-04-13 DIAGNOSIS — R05 Cough: Secondary | ICD-10-CM | POA: Diagnosis not present

## 2015-04-13 DIAGNOSIS — R1310 Dysphagia, unspecified: Secondary | ICD-10-CM | POA: Diagnosis not present

## 2015-04-13 DIAGNOSIS — R6881 Early satiety: Secondary | ICD-10-CM | POA: Insufficient documentation

## 2015-04-13 DIAGNOSIS — R1314 Dysphagia, pharyngoesophageal phase: Secondary | ICD-10-CM

## 2015-04-14 NOTE — Therapy (Signed)
[facility] [address] [place], [place], [address] Phone: [phone]   Fax:  [phone]  Modified Barium Swallow  Patient Details  Name: Nancy Horn MRN: [id number] Date of Birth: Feb 26, 1925 Referring Provider:  West Bali, MD  Encounter Date: 04/06/2015      End of Session - 04/14/15 1133    Visit Number 1   Number of Visits 1   Authorization Type BCBS Medicare   SLP Start Time 1300   SLP Stop Time  1345   SLP Time Calculation (min) 45 min   Activity Tolerance Patient tolerated treatment well      Past Medical History  Diagnosis Date  . Osteoporosis   . Vertigo   . Neuropathy   . Candida esophagitis APR 2016  . Helicobacter pylori gastritis APR 2016    Past Surgical History  Procedure Laterality Date  . Partial hysterectomy    . Esophagogastroduodenoscopy N/A 02/19/2015    Procedure: ESOPHAGOGASTRODUODENOSCOPY (EGD);  Surgeon: West Bali, MD;  [facility];  Service: Endoscopy;  Laterality: N/A;  830 - moved to 4/8 @ 10:30   . Esophageal dilation N/A 02/19/2015    Procedure: ESOPHAGEAL DILATION;  Surgeon: West Bali, MD;  Location: [facility];  Service: Endoscopy;  Laterality: N/A;    There were no vitals filed for this visit.  Visit Diagnosis: Dysphagia, pharyngoesophageal phase      Subjective Assessment - 04/06/15 1359    Subjective Pt c/o "throat filling up with liquid"   Special Tests MBSS   Currently in Pain? No/denies   Multiple Pain Sites No             General - 04/06/15 1411    General Information   Date of Onset 01/12/15   Other Pertinent Information Ms. Nancy Horn is an 81 yo female patient for Dr. Juanetta Gosling who was referred to Dr. Gladstone Lighter for increasing dysphagia. Pt states that she "occasionally felt like food didn't go down", but that in February 2016 she noticed great difficulty swallowing soup. Symptoms worsened and she complained of solid food  and pill dysphagia and hoarse vocal quality. She underwent EGD 02/19/2015 with dilation with Dr. Darrick Penna. (ENDOSCOPIC IMPRESSION: DILATED PROXIMAL ESOPHAGUS.POSSIBLE MID-ESOPHAGEAL WEB. MILD Non-erosive gastritis).Shortly after she complained of "filling up with liquids" and increased hoarseness. Her stomach Bx showed CANDIDA ESOPHAGITIS and H. Pylori infection which were treated with diflucan for 21 days followed by ABX. She was also prescribed a PPI. Pt was referred for MBSS due to continued complaints of dysphagia.    Type of Study --  MBSS   Reason for Referral Objectively evaluate swallowing function   Previous Swallow Assessment EGD 02/19/2015   Diet Prior to this Study Dysphagia 3 (soft);Thin liquids   Temperature Spikes Noted No   Respiratory Status Room air   History of Recent Intubation No   Behavior/Cognition Alert;Cooperative;Pleasant mood   Oral Cavity - Dentition Adequate natural dentition/normal for age   Oral Motor / Sensory Function Within functional limits   Self-Feeding Abilities Able to feed self   Patient Positioning Upright in chair/Tumbleform   Baseline Vocal Quality Normal   Volitional Cough Strong   Volitional Swallow Able to elicit   Anatomy Other (Comment)  pharyngeal area swelling?   Pharyngeal Secretions Not observed secondary MBS            Oral Preparation/Oral Phase - 04/06/15 1413    Oral Preparation/Oral Phase   Oral Phase Within  functional limits   Electrical stimulation - Oral Phase   Was Electrical Stimulation Used No          Pharyngeal Phase - 04/06/15 1413    Pharyngeal Phase   Pharyngeal Phase Impaired   Pharyngeal - Thin   Pharyngeal- Thin Cup Swallow initiation at vallecula;Swallow initiation at pyriform sinus;Lateral channel residue   Pharyngeal - Solids   Pharyngeal- Puree Within functional limits   Pharyngeal- Mechanical Soft Within functional limits   Pharyngeal- Pill Swallow initiation at vallecula;Delayed swallow  initiation;Reduced epiglottic inversion;Reduced tongue base retraction   Electrical Stimulation - Pharyngeal Phase   Was Electrical Stimulation Used No          Cricopharyngeal Phase - 04/06/15 1427    Cervical Esophageal Phase   Cervical Esophageal Phase Impaired   Cervical Esophageal Phase - Thin   Thin Cup Other (Comment)   Cervical Esophageal Phase - Solids   Pill Reduced cricopharyngeal relaxation   Cervical Esophageal Phase - Comment   Cervical Esophageal Comment Prior to po administration, imaging revealed air-filled/distended esophagus. Esophagus was dilated from cervical esophagus to distal esophagus                  Plan - 04/06/15 1434    Clinical Impression Statement Pt's oropharyngeal swallow was essentially [facility] for textures presented with some difficulty clearing barium tablet throught UES. Esophageal phase was markedly impaired characterized by air-filled, completely distended cervical esophagus. Radiologist confirms:The esophagus is dilated with gas into the cervical region. Imaging lower demonstrates areas of spasm and corkscrew appearance of the distal esophagus with dilated gas filled esophagus proximally. The pill sticks in the distal esophagus for several minutes.   No penetration or aspiration observed today, mild residuals in lateral channels with thin liquids which eventually clear. Pt's symptoms likely related to esophageal findings. Pt's pharyngeal area appears slightly swollen and moves visibly outward with inhalations. Pt was dilated in early April so it is possible that pt has underlying achalasia. Strongly recommend further assessment to assess for any anatomic abnormalities. In the meantime, pt educated on reflux precautions and advised to eat several small meals throughout the day, elevate HOB,etc.     Treatment/Interventions Aspiration precaution training;SLP instruction and feedback   Consulted and Agree with Plan of Care Patient             Recommendations/Treatment - 04/06/15 1431    Swallow Evaluation Recommendations   Recommended Consults Consider esophageal assessment;Consider GI evaluation;Consider ENT evaluation   SLP Diet Recommendations Dysphagia 3 (Mech soft);Thin   Liquid Administration via Cup   Medication Administration Whole meds with puree   Supervision Patient able to self feed          Prognosis - 04/06/15 1432    Prognosis   Prognosis for Safe Diet Advancement Good   Individuals Consulted   Consulted and Agree with Results and Recommendations Patient;PA   Report Sent to  Referring physician      Problem List Patient Active Problem List   Diagnosis Date Noted  . Dysphagia, pharyngoesophageal phase 01/27/2015   Thank you,  Havery MorosDabney Porter, CCC-SLP [phone]  [facility],DABNEY 04/06/2015, 15:38 PM  [facility] [address] [place], [place], [address] Phone: [phone]   Fax:  [phone]

## 2015-04-15 ENCOUNTER — Other Ambulatory Visit (HOSPITAL_COMMUNITY): Payer: Medicare Other

## 2015-04-16 NOTE — Progress Notes (Signed)
Quick Note:  BPE reviewed. There are significantly abnormal findings. may be dealing with achalasia. [facility] In the meantime, needs to eat very small meals, soft foods, follow speech recommendations with chewing well, sitting upright at all times during eating and avoid tough textures. ______

## 2015-04-19 ENCOUNTER — Other Ambulatory Visit: Payer: Self-pay

## 2015-04-19 DIAGNOSIS — K220 Achalasia of cardia: Secondary | ICD-10-CM

## 2015-04-20 NOTE — Telephone Encounter (Signed)
PLEASE CALL PT. HER SWALLOWING STUDY SHOWS A VERY WEAK ESOPHAGUS. SHE SHOULD AVOID FOSAMAX AND CRUSH PILLS IF POSSIBLE. IT IS USUALLY DUE TO AGING. CRUSHING HER PILLS AND CHANGING TO A SOFT DIET IS THE ONLY TREATMENT. SHE COULD GO TO [facility] FOR A SECOND OPINION AND ADDITIONAL TESTING. OPV JUL 2016 E30 DYSPHAGIA/H PYLORI GASTRITIS.

## 2015-04-20 NOTE — Telephone Encounter (Signed)
PT is aware. Referral has already been made and she has an appt on 05/19/2015.

## 2015-04-20 NOTE — Telephone Encounter (Signed)
Recall already made in Epic

## 2015-04-28 ENCOUNTER — Encounter: Payer: Self-pay | Admitting: Gastroenterology

## 2015-05-03 ENCOUNTER — Telehealth: Payer: Self-pay

## 2015-05-03 NOTE — Telephone Encounter (Signed)
Pt called to confirm who her doctor appointment is with.  Pt states he had only seen Dr. Darrick Penna briefly before her procedure. States she really wanted to see Dr. Jena Gauss.  I informed the patient that she has every right to choose whom she wanted to be her primary GI doctor. I told her that her upcoming appointment in July is with Dr. Darrick Penna to follow up on the procedure she had done with her. I told pt that she needs to keep this appointment with Dr. Darrick Penna since she did the procedure. Told her that if she wanted to switch she could discuss with Dr. Darrick Penna at and/or after her next OV.

## 2015-05-03 NOTE — Telephone Encounter (Signed)
IF PT WANTS TO SWITCH, SHE MAY SWITCH TO DR. Jena Gauss TODAY. CANCEL APPT WITH DR. Atonya Templer. PLEASE ASK HIM IF IT'S OK.

## 2015-05-03 NOTE — Telephone Encounter (Signed)
Routing to Dr. Jena Gauss for his approval.

## 2015-05-07 NOTE — Telephone Encounter (Signed)
I will take her on as a new patient.  However, she must follow through with the recommended appointment to see Dr. Gwinda Passe next month at [facility]. We can schedule her back in the office about a month to 6 weeks from now. We need to have feedback from [facility] available before she returns to the office.

## 2015-05-10 NOTE — Telephone Encounter (Signed)
Correction her appt is in August with [facility]

## 2015-05-10 NOTE — Telephone Encounter (Signed)
Noted and I spoke with pt this am.  She is aware of her appointment

## 2015-05-10 NOTE — Telephone Encounter (Signed)
Called pt and LMOM for her to call office back 

## 2015-05-10 NOTE — Telephone Encounter (Signed)
Patient is aware and knows about her appt at [facility] and her appt her with [facility] in July

## 2015-05-10 NOTE — Telephone Encounter (Signed)
I called the patient, no answer, lmom  

## 2015-05-19 ENCOUNTER — Ambulatory Visit: Payer: Medicare Other | Admitting: Gastroenterology

## 2015-05-21 ENCOUNTER — Ambulatory Visit: Payer: Medicare Other | Admitting: Gastroenterology

## 2015-06-01 ENCOUNTER — Other Ambulatory Visit (HOSPITAL_COMMUNITY): Payer: Self-pay | Admitting: Pulmonary Disease

## 2015-06-01 ENCOUNTER — Ambulatory Visit (HOSPITAL_COMMUNITY)
Admission: RE | Admit: 2015-06-01 | Discharge: 2015-06-01 | Disposition: A | Discharge status: Home / Self Care | Payer: Medicare Other | Source: Ambulatory Visit | Attending: Pulmonary Disease | Admitting: Pulmonary Disease

## 2015-06-01 ENCOUNTER — Ambulatory Visit: Payer: Medicare Other | Admitting: Internal Medicine

## 2015-06-01 DIAGNOSIS — W19XXXA Unspecified fall, initial encounter: Secondary | ICD-10-CM

## 2015-06-01 DIAGNOSIS — S299XXA Unspecified injury of thorax, initial encounter: Secondary | ICD-10-CM | POA: Insufficient documentation

## 2015-06-02 ENCOUNTER — Ambulatory Visit: Payer: Medicare Other | Admitting: Gastroenterology

## 2015-06-22 ENCOUNTER — Ambulatory Visit: Payer: Medicare Other | Admitting: Internal Medicine

## 2015-06-24 ENCOUNTER — Encounter (HOSPITAL_COMMUNITY): Payer: Self-pay | Admitting: Emergency Medicine

## 2015-06-24 ENCOUNTER — Emergency Department (HOSPITAL_COMMUNITY)
Admission: EM | Admit: 2015-06-24 | Discharge: 2015-06-24 | Disposition: A | Discharge status: Home / Self Care | Payer: Medicare Other | Attending: Emergency Medicine | Admitting: Emergency Medicine

## 2015-06-24 DIAGNOSIS — R630 Anorexia: Secondary | ICD-10-CM | POA: Insufficient documentation

## 2015-06-24 DIAGNOSIS — Z79899 Other long term (current) drug therapy: Secondary | ICD-10-CM | POA: Insufficient documentation

## 2015-06-24 DIAGNOSIS — M810 Age-related osteoporosis without current pathological fracture: Secondary | ICD-10-CM | POA: Diagnosis not present

## 2015-06-24 DIAGNOSIS — E860 Dehydration: Secondary | ICD-10-CM | POA: Insufficient documentation

## 2015-06-24 DIAGNOSIS — R197 Diarrhea, unspecified: Secondary | ICD-10-CM | POA: Diagnosis present

## 2015-06-24 DIAGNOSIS — Z8619 Personal history of other infectious and parasitic diseases: Secondary | ICD-10-CM | POA: Diagnosis not present

## 2015-06-24 DIAGNOSIS — R110 Nausea: Secondary | ICD-10-CM | POA: Diagnosis not present

## 2015-06-24 DIAGNOSIS — G629 Polyneuropathy, unspecified: Secondary | ICD-10-CM | POA: Insufficient documentation

## 2015-06-24 LAB — CBC WITH DIFFERENTIAL/PLATELET
Basophils Absolute: 0 10*3/uL (ref 0.0–0.1)
Basophils Relative: 0 % (ref 0–1)
Eosinophils Absolute: 0 10*3/uL (ref 0.0–0.7)
Eosinophils Relative: 0 % (ref 0–5)
HCT: 42.2 % (ref 36.0–46.0)
HEMOGLOBIN: 13.9 g/dL (ref 12.0–15.0)
Lymphocytes Relative: 7 % — ABNORMAL LOW (ref 12–46)
Lymphs Abs: 0.7 10*3/uL (ref 0.7–4.0)
MCH: 30.8 pg (ref 26.0–34.0)
MCHC: 32.9 g/dL (ref 30.0–36.0)
MCV: 93.6 fL (ref 78.0–100.0)
Monocytes Absolute: 0.2 10*3/uL (ref 0.1–1.0)
Monocytes Relative: 2 % — ABNORMAL LOW (ref 3–12)
NEUTROS PCT: 91 % — AB (ref 43–77)
Neutro Abs: 9.5 10*3/uL — ABNORMAL HIGH (ref 1.7–7.7)
Platelets: 195 10*3/uL (ref 150–400)
RBC: 4.51 MIL/uL (ref 3.87–5.11)
RDW: 14 % (ref 11.5–15.5)
WBC: 10.4 10*3/uL (ref 4.0–10.5)

## 2015-06-24 LAB — COMPREHENSIVE METABOLIC PANEL
ALBUMIN: 4.1 g/dL (ref 3.5–5.0)
ALK PHOS: 77 U/L (ref 38–126)
ALT: 22 U/L (ref 14–54)
ANION GAP: 10 (ref 5–15)
AST: 25 U/L (ref 15–41)
BILIRUBIN TOTAL: 0.9 mg/dL (ref 0.3–1.2)
BUN: 20 mg/dL (ref 6–20)
CO2: 26 mmol/L (ref 22–32)
Calcium: 9.6 mg/dL (ref 8.9–10.3)
Chloride: 101 mmol/L (ref 101–111)
Creatinine, Ser: 0.92 mg/dL (ref 0.44–1.00)
GFR, EST NON AFRICAN AMERICAN: 54 mL/min — AB (ref >60)
GLUCOSE: 198 mg/dL — AB (ref 65–99)
POTASSIUM: 4.2 mmol/L (ref 3.5–5.1)
SODIUM: 137 mmol/L (ref 135–145)
Total Protein: 7.4 g/dL (ref 6.5–8.1)

## 2015-06-24 MED ORDER — ONDANSETRON HCL 4 MG/2ML IJ SOLN
4.0000 mg | Freq: Once | INTRAMUSCULAR | Status: AC
  Administered 2015-06-24: 4 mg via INTRAVENOUS
  Filled 2015-06-24: qty 2

## 2015-06-24 MED ORDER — SODIUM CHLORIDE 0.9 % IV BOLUS (SEPSIS)
1000.0000 mL | Freq: Once | INTRAVENOUS | Status: AC
  Administered 2015-06-24: 1000 mL via INTRAVENOUS

## 2015-06-24 MED ORDER — ONDANSETRON 4 MG PO TBDP: ORAL_TABLET | ORAL | Status: DC

## 2015-06-24 NOTE — ED Notes (Signed)
Daughter-in-law reports patient has enlarged esophagus and is being seen by GI. She reports patient is having trouble eating or drinking and cannot keep anything down due to gagging. Reports patient is also having lots of diarrhea. Patient denies pain, complains of generalized weakness, fatigue, feeling lightheaded when she stands, and complains of nausea. Patient reports when she talks it makes her nauseous.

## 2015-06-24 NOTE — ED Provider Notes (Signed)
CSN: [id number]     Arrival date & time 06/24/15  2115 History  This chart was scribed for Nancy Berkshire, MD by Doreatha Martin, ED Scribe. This patient was seen in room APA07/[facility]07 and the patient's care was started at 9:47 PM.     Chief Complaint  Patient presents with  . Diarrhea  . Fatigue   Patient is a 81 y.o. female presenting with diarrhea. The history is provided by the patient and a relative (pt c/o diarrhea ). No language interpreter was used.  Diarrhea Quality:  Watery Severity:  Moderate Onset quality:  Gradual Duration:  3 days Timing:  Intermittent Relieved by:  None tried Worsened by:  Nothing tried Ineffective treatments:  None tried Associated symptoms: no abdominal pain, no headaches and no vomiting   Risk factors: no recent antibiotic use     HPI Comments: Nancy Horn is a 81 y.o. female who presents to the Emergency Department complaining of moderate, intermittent diarrhea onset 3 days ago. Daughter notes associated nausea, decreased appetite onset a few months ago. She states that they were seen at [facility] yesterday and were told that her esophagus is enlarged. Daughter states no recent antibiotic use. She denies emesis, abdominal pain.   Past Medical History  Diagnosis Date  . Osteoporosis   . Vertigo   . Neuropathy   . Candida esophagitis APR 2016  . Helicobacter pylori gastritis APR 2016    treated with amox and biaxin   Past Surgical History  Procedure Laterality Date  . Partial hysterectomy    . Esophagogastroduodenoscopy N/A 02/19/2015    Dr.Fields- dilated proximal esophagus, possible mid- esophageal web, mild non-erosive gastritis bx= chronic gastritis with focal activity and hpylori  . Esophageal dilation N/A 02/19/2015    Procedure: ESOPHAGEAL DILATION;  Surgeon: West Bali, MD;  [facility];  Service: Endoscopy;  Laterality: N/A;   Family History  Problem Relation Age of Onset  . Colon cancer Neg Hx    Social History   Substance Use Topics  . Smoking status: Never Smoker   . Smokeless tobacco: None  . Alcohol Use: No   OB History    No data available     Review of Systems  Constitutional: Positive for appetite change. Negative for fatigue.  HENT: Negative for congestion, ear discharge and sinus pressure.   Eyes: Negative for discharge.  Respiratory: Negative for cough.   Cardiovascular: Negative for chest pain.  Gastrointestinal: Positive for nausea and diarrhea. Negative for vomiting and abdominal pain.  Genitourinary: Negative for frequency and hematuria.  Musculoskeletal: Negative for back pain.  Skin: Negative for rash.  Neurological: Negative for seizures and headaches.  Psychiatric/Behavioral: Negative for hallucinations.   Allergies  Review of patient's allergies indicates no known allergies.  Home Medications   Prior to Admission medications   Medication Sig Start Date End Date Taking? Authorizing Provider  alendronate (FOSAMAX) 70 MG tablet Take 70 mg by mouth once a week. 12/11/14   Historical Provider, MD  amoxicillin (AMOXIL) 500 MG tablet 2 PO BID FOR 10 DAYS 03/19/15   West Bali, MD  clarithromycin (BIAXIN) 500 MG tablet 1 PO BID FOR 10 DAYS. 03/19/15   West Bali, MD  fluconazole (DIFLUCAN) 100 MG tablet 2 PO TODAY THEN 1 PO DAILY FOR 21 DAYS. 02/25/15   West Bali, MD  gabapentin (NEURONTIN) 100 MG capsule 200 mg at bedtime. 01/01/15   Historical Provider, MD  meclizine (ANTIVERT) 25 MG tablet Take 25 mg  by mouth at bedtime.    Historical Provider, MD  omeprazole (PRILOSEC) 20 MG capsule Take 1 capsule (20 mg total) by mouth daily. 04/06/15   Tiffany Kocher, PA-C   BP 128/73 mmHg  Pulse 90  Resp 18  Ht  (1.651 m)  Wt 109 lb (49.442 kg)  BMI 18.14 kg/m2  SpO2 99% Physical Exam  Constitutional: She is oriented to person, place, and time. She appears well-developed.  HENT:  Head: Normocephalic.  Dry mucous membranes  Eyes: Conjunctivae and EOM are normal. No  scleral icterus.  Neck: Neck supple. No thyromegaly present.  Cardiovascular: Normal rate and regular rhythm.  Exam reveals no gallop and no friction rub.   No murmur heard. Pulmonary/Chest: No stridor. She has no wheezes. She has no rales. She exhibits no tenderness.  Abdominal: She exhibits no distension. There is no tenderness. There is no rebound.  Musculoskeletal: Normal range of motion. She exhibits no edema.  Lymphadenopathy:    She has no cervical adenopathy.  Neurological: She is oriented to person, place, and time. She exhibits normal muscle tone. Coordination normal.  Skin: No rash noted. No erythema.  Psychiatric: She has a normal mood and affect. Her behavior is normal.  Nursing note and vitals reviewed.  ED Course  Procedures (including critical care time) DIAGNOSTIC STUDIES: Oxygen Saturation is 99% on RA, normal by my interpretation.    COORDINATION OF CARE: 9:50 PM Discussed treatment plan with pt at bedside and pt agreed to plan.   Labs Review Labs Reviewed - No data to display  Imaging Review No results found. I, Doreatha Martin, personally reviewed and evaluated these images and lab results as part of my medical decision-making.   EKG Interpretation None      MDM   Final diagnoses:  None   Pt with diarrhea and nausea,  [facility] with [facility].   pts labs show some dehydration.  She felt improved with iv fluids and wanted to try out pt hydration.  Pt given zofran and is to follow up in 1-2 days with her md.  She is instructed to return if she does not take fluids in.  The chart was scribed for me under my direct supervision.  I personally performed the history, physical, and medical decision making and all procedures in the evaluation of this patient.Nancy Berkshire, MD 06/27/15 [phone]

## 2015-06-24 NOTE — Discharge Instructions (Signed)
Drink plenty of fluids and follow up with your md next week °

## 2015-06-24 NOTE — ED Notes (Signed)
Was not able to get patient temp at this time

## 2015-06-25 ENCOUNTER — Encounter (HOSPITAL_COMMUNITY): Payer: Self-pay | Admitting: *Deleted

## 2015-06-25 ENCOUNTER — Observation Stay (HOSPITAL_COMMUNITY)
Admission: EM | Admit: 2015-06-25 | Discharge: 2015-06-29 | Disposition: A | Discharge status: Home / Self Care | Payer: Medicare Other | Attending: Pulmonary Disease | Admitting: Pulmonary Disease

## 2015-06-25 DIAGNOSIS — I679 Cerebrovascular disease, unspecified: Secondary | ICD-10-CM

## 2015-06-25 DIAGNOSIS — Z8619 Personal history of other infectious and parasitic diseases: Secondary | ICD-10-CM | POA: Insufficient documentation

## 2015-06-25 DIAGNOSIS — E440 Moderate protein-calorie malnutrition: Secondary | ICD-10-CM | POA: Diagnosis present

## 2015-06-25 DIAGNOSIS — E860 Dehydration: Secondary | ICD-10-CM | POA: Diagnosis present

## 2015-06-25 DIAGNOSIS — R110 Nausea: Secondary | ICD-10-CM

## 2015-06-25 DIAGNOSIS — M810 Age-related osteoporosis without current pathological fracture: Secondary | ICD-10-CM | POA: Diagnosis not present

## 2015-06-25 DIAGNOSIS — R531 Weakness: Secondary | ICD-10-CM

## 2015-06-25 DIAGNOSIS — G629 Polyneuropathy, unspecified: Secondary | ICD-10-CM | POA: Diagnosis not present

## 2015-06-25 DIAGNOSIS — K228 Other specified diseases of esophagus: Secondary | ICD-10-CM | POA: Insufficient documentation

## 2015-06-25 DIAGNOSIS — R197 Diarrhea, unspecified: Secondary | ICD-10-CM | POA: Diagnosis not present

## 2015-06-25 DIAGNOSIS — Z79899 Other long term (current) drug therapy: Secondary | ICD-10-CM | POA: Diagnosis not present

## 2015-06-25 DIAGNOSIS — K2289 Other specified disease of esophagus: Secondary | ICD-10-CM

## 2015-06-25 DIAGNOSIS — K310 Acute dilatation of stomach: Secondary | ICD-10-CM | POA: Diagnosis not present

## 2015-06-25 DIAGNOSIS — R42 Dizziness and giddiness: Secondary | ICD-10-CM

## 2015-06-25 DIAGNOSIS — K3189 Other diseases of stomach and duodenum: Secondary | ICD-10-CM

## 2015-06-25 LAB — CBC WITH DIFFERENTIAL/PLATELET
Basophils Absolute: 0 10*3/uL (ref 0.0–0.1)
Basophils Relative: 0 % (ref 0–1)
EOS PCT: 0 % (ref 0–5)
Eosinophils Absolute: 0 10*3/uL (ref 0.0–0.7)
HEMATOCRIT: 39.7 % (ref 36.0–46.0)
Hemoglobin: 12.7 g/dL (ref 12.0–15.0)
LYMPHS ABS: 0.6 10*3/uL — AB (ref 0.7–4.0)
LYMPHS PCT: 17 % (ref 12–46)
MCH: 30 pg (ref 26.0–34.0)
MCHC: 32 g/dL (ref 30.0–36.0)
MCV: 93.9 fL (ref 78.0–100.0)
MONOS PCT: 17 % — AB (ref 3–12)
Monocytes Absolute: 0.6 10*3/uL (ref 0.1–1.0)
Neutro Abs: 2.4 10*3/uL (ref 1.7–7.7)
Neutrophils Relative %: 66 % (ref 43–77)
Platelets: 178 10*3/uL (ref 150–400)
RBC: 4.23 MIL/uL (ref 3.87–5.11)
RDW: 14.2 % (ref 11.5–15.5)
WBC: 3.6 10*3/uL — AB (ref 4.0–10.5)

## 2015-06-25 LAB — BASIC METABOLIC PANEL
Anion gap: 11 (ref 5–15)
BUN: 23 mg/dL — AB (ref 6–20)
CALCIUM: 8.2 mg/dL — AB (ref 8.9–10.3)
CHLORIDE: 106 mmol/L (ref 101–111)
CO2: 23 mmol/L (ref 22–32)
CREATININE: 0.85 mg/dL (ref 0.44–1.00)
GFR calc Af Amer: >60 mL/min (ref >60)
GFR, EST NON AFRICAN AMERICAN: 60 mL/min — AB (ref >60)
GLUCOSE: 145 mg/dL — AB (ref 65–99)
POTASSIUM: 4.2 mmol/L (ref 3.5–5.1)
Sodium: 140 mmol/L (ref 135–145)

## 2015-06-25 LAB — URINALYSIS, ROUTINE W REFLEX MICROSCOPIC
GLUCOSE, UA: NEGATIVE mg/dL
Hgb urine dipstick: NEGATIVE
Leukocytes, UA: NEGATIVE
NITRITE: NEGATIVE
PROTEIN: NEGATIVE mg/dL
Specific Gravity, Urine: >1.03 — ABNORMAL HIGH (ref 1.005–1.030)
Urobilinogen, UA: 0.2 mg/dL (ref 0.0–1.0)
pH: 6 (ref 5.0–8.0)

## 2015-06-25 MED ORDER — SODIUM CHLORIDE 0.9 % IV SOLN
INTRAVENOUS | Status: DC
  Administered 2015-06-25: 22:00:00 via INTRAVENOUS

## 2015-06-25 MED ORDER — ONDANSETRON HCL 4 MG/2ML IJ SOLN
4.0000 mg | Freq: Once | INTRAMUSCULAR | Status: AC
  Administered 2015-06-25: 4 mg via INTRAVENOUS
  Filled 2015-06-25: qty 2

## 2015-06-25 MED ORDER — SODIUM CHLORIDE 0.9 % IV BOLUS (SEPSIS)
500.0000 mL | Freq: Once | INTRAVENOUS | Status: AC
  Administered 2015-06-25: 500 mL via INTRAVENOUS

## 2015-06-25 NOTE — ED Notes (Signed)
Pt brought in by [facility] for c/o diarrhea; pt states she has an enlarged esophagus and is having trouble eating with eating; pt states she is having diarrhea; pt was seen here last night for same complaint; pt states she received 2 bags of fluid last night and was better til she got home

## 2015-06-25 NOTE — ED Notes (Signed)
Unable to obtain orthostatic vitals due to patient feeling nauseated and refusing to sit or stand.

## 2015-06-25 NOTE — ED Notes (Addendum)
Pt reporting some improvement in nausea.  States that she does continue to have some diarrhea.  Scant amount noted on chucks when pt stood for BP. Pt denies need to urinate at present.

## 2015-06-25 NOTE — ED Notes (Addendum)
Pt continues to have small amounts of diarrhea.  Reporting continued mild nausea.   Urine obtained via in and out cath.

## 2015-06-25 NOTE — ED Provider Notes (Signed)
CSN: [id number]     Arrival date & time 06/25/15  2123 History  This chart was scribed for Mancel Bale, MD by Phillis Haggis, ED Scribe. This patient was seen in room APA15/APA15 and patient care was started at 9:44 PM.   Chief Complaint  Patient presents with  . Nausea   The history is provided by the patient. No language interpreter was used.  HPI Comments: Nancy Horn is a 81 y.o. female with a hx of Candida Esophagitis and Helicobacter pylori gastritis who presents to the Emergency Department brought in by EMS complaining of diarrhea and nausea onset 3 days ago. Pt states that she cannot eat or drink, and feels nauseous when talking. States that she was seen at [facility] for an enlarged esophagus, diagnosed in March, that she states the doctor attributed to her neuropathy. Pt states that she had diarrhea Tuesday and Wednesday that worsened last night. States that she was seen last night in the ED and given fluids to some relief but worsened today. Reports that the diarrhea is grainy, watery and light brown, but does not know how many total episodes she's had. She states that she has been trying to suck on ice but will pass it. She reports that her daughter talked to Dr. Juanetta Gosling, who believes the pt needs to be admitted. Son states that she has not been talking much today. Pt states that she is now 109 lbs from 120 lbs last December, losing 3 pounds in the past month. States that she has a neurologist for her neuropathy at [facility] but is looking to see someone in [place]. Denies chest pain, abdominal pain, back pain, vomiting, cough, or hematochezia.   Past Medical History  Diagnosis Date  . Osteoporosis   . Vertigo   . Neuropathy   . Candida esophagitis APR 2016  . Helicobacter pylori gastritis APR 2016    treated with amox and biaxin   Past Surgical History  Procedure Laterality Date  . Partial hysterectomy    . Esophagogastroduodenoscopy N/A 02/19/2015    Dr.Fields- dilated proximal  esophagus, possible mid- esophageal web, mild non-erosive gastritis bx= chronic gastritis with focal activity and hpylori  . Esophageal dilation N/A 02/19/2015    Procedure: ESOPHAGEAL DILATION;  Surgeon: West Bali, MD;  Location: [facility];  Service: Endoscopy;  Laterality: N/A;   Family History  Problem Relation Age of Onset  . Colon cancer Neg Hx    Social History  Substance Use Topics  . Smoking status: Never Smoker   . Smokeless tobacco: None  . Alcohol Use: No   OB History    No data available     Review of Systems  Constitutional: Positive for appetite change.  Respiratory: Negative for cough.   Cardiovascular: Negative for chest pain.  Gastrointestinal: Positive for nausea and diarrhea. Negative for vomiting, abdominal pain, blood in stool and anal bleeding.  Musculoskeletal: Negative for back pain.  All other systems reviewed and are negative.  Allergies  Review of patient's allergies indicates no known allergies.  Home Medications   Prior to Admission medications   Medication Sig Start Date End Date Taking? Authorizing Provider  gabapentin (NEURONTIN) 100 MG capsule Take 200 mg by mouth at bedtime.  01/01/15  Yes Historical Provider, MD  meclizine (ANTIVERT) 25 MG tablet Take 25 mg by mouth at bedtime.   Yes Historical Provider, MD  omeprazole (PRILOSEC) 20 MG capsule Take 1 capsule (20 mg total) by mouth daily. 04/06/15  Yes Leanna Battles  Lewis, PA-C  ondansetron (ZOFRAN ODT) 4 MG disintegrating tablet 4mg  ODT q4 hours prn nausea/vomit 06/24/15  Yes Bethann Berkshire, MD   BP 122/57 mmHg  Pulse 80  Temp(Src) 98.9 F (37.2 C) (Axillary)  Resp 26  SpO2 100%   Physical Exam  Constitutional: She is oriented to person, place, and time. She appears well-developed.  Elderly, frail  HENT:  Head: Normocephalic and atraumatic.  Eyes: Conjunctivae and EOM are normal. Pupils are equal, round, and reactive to light.  Neck: Normal range of motion and phonation normal. Neck  supple.  Cardiovascular: Normal rate and regular rhythm.   Pulmonary/Chest: Effort normal and breath sounds normal. No respiratory distress. She exhibits no tenderness.  Abdominal: Soft. She exhibits no distension. There is no tenderness. There is no guarding.  Hyperactive and high pitched bowel sounds  Musculoskeletal: Normal range of motion.  Neurological: She is alert and oriented to person, place, and time. She exhibits normal muscle tone.  Skin: Skin is warm and dry.  Psychiatric: Her behavior is normal. Thought content normal.  She is anxious  Nursing note and vitals reviewed.   ED Course  Procedures (including critical care time) DIAGNOSTIC STUDIES: Oxygen Saturation is 100% on RA, normal by my interpretation.    COORDINATION OF CARE: 9:53 PM-Discussed treatment plan which includes labs and Zofran with pt at bedside and pt agreed to plan.    Medications  0.9 %  sodium chloride infusion ( Intravenous New Bag/Given 06/25/15 2211)  sodium chloride 0.9 % bolus 500 mL (0 mLs Intravenous Stopped 06/25/15 2316)  ondansetron (ZOFRAN) injection 4 mg (4 mg Intravenous Given 06/25/15 2210)    Patient Vitals for the past 24 hrs:  BP Temp Temp src Pulse Resp SpO2  06/26/15 0000 143/72 mmHg - - 93 (!) 28 99 %  06/25/15 2300 133/61 mmHg - - - 19 -  06/25/15 2200 142/80 mmHg - - - - -  06/25/15 2134 122/57 mmHg 98.9 F (37.2 C) Axillary 80 26 100 %     Record review from recent GI consultation at [facility]. She had an extensive evaluation there. The physician does not believe that she has achalasia, and has referred her to neurology for workup of peripheral neuropathy. He believes that this is the source of her esophageal and upper GI tract dysmotility. He recommended symptomatic treatment, with blenderized food, and oral supplementation with liquids.  11:31 PM Reevaluation with update and discussion. After initial assessment and treatment, an updated evaluation  reveals patient states that she is continually having loose diarrhea. At this time she has about 4 ounces of water consistency, brown diarrhea in the back pan  which she is sitting on. Patient's daughter is here now, and the findings were discussed with her. Patient is very anxious and the daughter believes that she is "depressed". Jibreel Fedewa L   12:13 AM-Consult complete with Dr. Onalee Hua. Patient case explained and discussed. She agrees to admit patient for further evaluation and treatment. Call ended at 00:40  Labs Review Labs Reviewed  CBC WITH DIFFERENTIAL/PLATELET - Abnormal; Notable for the following:    WBC 3.6 (*)    Lymphs Abs 0.6 (*)    Monocytes Relative 17 (*)    All other components within normal limits  BASIC METABOLIC PANEL - Abnormal; Notable for the following:    Glucose, Bld 145 (*)    BUN 23 (*)    Calcium 8.2 (*)    GFR calc non Af Amer 60 (*)  All other components within normal limits  URINALYSIS, ROUTINE W REFLEX MICROSCOPIC (NOT AT [facility]) - Abnormal; Notable for the following:    Specific Gravity, Urine >1.030 (*)    Bilirubin Urine MODERATE (*)    Ketones, ur >80 (*)    All other components within normal limits    Imaging Review No results found.    EKG Interpretation None      MDM   Final diagnoses:  Diarrhea  Nausea  Esophageal dilatation  Gastric dilatation    Diarrhea for 3 days, with recent complicated GI history. He is being evaluated for peripheral neuropathy which has lead to esophageal, stomach, and upper intestinal dilatation. Source of the diarrhea, is not clear. There is no overt sign of infection, such as elevated white count or fever. Patient clearly has decreased oral intake, which is leading to mild ketosis, and likely overall volume depletion. She will likely worsen if discharged. I'm not confident that giving medications to slow GI transit, will be overall helpful for her at this time. He would make sense to observe with IV fluids,  and measure input, output, and then determine her overall volume needs. She does not appear to be a good candiPlan: Admitdate for gastric tube placement, secondary to the dilatation of her stomach.  Nursing Notes Reviewed/ Care Coordinated, and agree without changes. Applicable Imaging Reviewed.  Interpretation of Laboratory Data incorporated into ED treatment  Plan: Admit   I personally performed the services described in this documentation, which was scribed in my presence. The recorded information has been reviewed and is accurate.     Mancel Bale, MD 06/26/15 [phone]

## 2015-06-26 ENCOUNTER — Encounter (HOSPITAL_COMMUNITY): Payer: Self-pay

## 2015-06-26 DIAGNOSIS — R197 Diarrhea, unspecified: Secondary | ICD-10-CM | POA: Diagnosis not present

## 2015-06-26 DIAGNOSIS — R42 Dizziness and giddiness: Secondary | ICD-10-CM

## 2015-06-26 DIAGNOSIS — E440 Moderate protein-calorie malnutrition: Secondary | ICD-10-CM | POA: Diagnosis present

## 2015-06-26 DIAGNOSIS — E860 Dehydration: Secondary | ICD-10-CM | POA: Diagnosis present

## 2015-06-26 DIAGNOSIS — R531 Weakness: Secondary | ICD-10-CM

## 2015-06-26 LAB — CBC
HEMATOCRIT: 35 % — AB (ref 36.0–46.0)
Hemoglobin: 11.2 g/dL — ABNORMAL LOW (ref 12.0–15.0)
MCH: 30.2 pg (ref 26.0–34.0)
MCHC: 32 g/dL (ref 30.0–36.0)
MCV: 94.3 fL (ref 78.0–100.0)
Platelets: 165 10*3/uL (ref 150–400)
RBC: 3.71 MIL/uL — ABNORMAL LOW (ref 3.87–5.11)
RDW: 14.3 % (ref 11.5–15.5)
WBC: 3.2 10*3/uL — ABNORMAL LOW (ref 4.0–10.5)

## 2015-06-26 LAB — BASIC METABOLIC PANEL
Anion gap: 6 (ref 5–15)
BUN: 22 mg/dL — AB (ref 6–20)
CALCIUM: 7.2 mg/dL — AB (ref 8.9–10.3)
CO2: 23 mmol/L (ref 22–32)
Chloride: 115 mmol/L — ABNORMAL HIGH (ref 101–111)
Creatinine, Ser: 0.72 mg/dL (ref 0.44–1.00)
GFR calc Af Amer: >60 mL/min (ref >60)
GFR calc non Af Amer: >60 mL/min (ref >60)
GLUCOSE: 127 mg/dL — AB (ref 65–99)
POTASSIUM: 3.4 mmol/L — AB (ref 3.5–5.1)
SODIUM: 144 mmol/L (ref 135–145)

## 2015-06-26 LAB — C DIFFICILE QUICK SCREEN W PCR REFLEX
C DIFFICILE (CDIFF) INTERP: NEGATIVE
C DIFFICILE (CDIFF) TOXIN: NEGATIVE
C DIFFICLE (CDIFF) ANTIGEN: NEGATIVE

## 2015-06-26 LAB — GLUCOSE, CAPILLARY: GLUCOSE-CAPILLARY: 89 mg/dL (ref 65–99)

## 2015-06-26 MED ORDER — ENSURE ENLIVE PO LIQD
237.0000 mL | Freq: Two times a day (BID) | ORAL | Status: DC
  Administered 2015-06-29: 237 mL via ORAL

## 2015-06-26 MED ORDER — MECLIZINE HCL 12.5 MG PO TABS
25.0000 mg | ORAL_TABLET | Freq: Every day | ORAL | Status: DC
  Administered 2015-06-26 – 2015-06-28 (×4): 25 mg via ORAL
  Filled 2015-06-26 (×4): qty 2

## 2015-06-26 MED ORDER — SODIUM CHLORIDE 0.9 % IV SOLN: INTRAVENOUS | Status: DC

## 2015-06-26 MED ORDER — METRONIDAZOLE 500 MG PO TABS
500.0000 mg | ORAL_TABLET | Freq: Three times a day (TID) | ORAL | Status: DC
  Administered 2015-06-26 – 2015-06-27 (×4): 500 mg via ORAL
  Filled 2015-06-26 (×4): qty 1

## 2015-06-26 MED ORDER — BOOST / RESOURCE BREEZE PO LIQD: 1.0000 | Freq: Three times a day (TID) | ORAL | Status: DC

## 2015-06-26 MED ORDER — ONDANSETRON HCL 4 MG/2ML IJ SOLN
4.0000 mg | Freq: Four times a day (QID) | INTRAMUSCULAR | Status: DC | PRN
  Administered 2015-06-26: 4 mg via INTRAVENOUS
  Filled 2015-06-26: qty 2

## 2015-06-26 MED ORDER — ONDANSETRON HCL 4 MG PO TABS: 4.0000 mg | ORAL_TABLET | Freq: Four times a day (QID) | ORAL | Status: DC | PRN

## 2015-06-26 MED ORDER — SODIUM CHLORIDE 0.9 % IV SOLN
INTRAVENOUS | Status: DC
  Administered 2015-06-26 – 2015-06-28 (×4): via INTRAVENOUS

## 2015-06-26 MED ORDER — GABAPENTIN 100 MG PO CAPS
200.0000 mg | ORAL_CAPSULE | Freq: Every day | ORAL | Status: DC
  Administered 2015-06-26 – 2015-06-28 (×3): 200 mg via ORAL
  Filled 2015-06-26 (×3): qty 2

## 2015-06-26 NOTE — Progress Notes (Signed)
Subjective: She came to the hospital because of increasing problems with swallowing and inability to maintain hydration. She also had significant issues with diarrhea. She says this morning she thinks her diarrhea may have stopped but she did have a large diarrheal bowel movement last night. She has trouble swallowing. She not only has trouble initiating the swallowing process but she also feels like the ones she swallows she has liquid in the back of her throat. She has been being worked up for this with no obvious cause found yet.  Objective: Vital signs in last 24 hours: Temp:  [97.6 F (36.4 C)-98.9 F (37.2 C)] 97.6 F (36.4 C) (08/13 0645) Pulse Rate:  [76-93] 76 (08/13 0645) Resp:  [19-28] 20 (08/13 0645) BP: (102-143)/(42-80) 102/42 mmHg (08/13 0645) SpO2:  [93 %-100 %] 97 % (08/13 0645) Weight:  [50.894 kg (112 lb 3.2 oz)] 50.894 kg (112 lb 3.2 oz) (08/13 0152) Weight change:  Last BM Date: 06/26/15  Intake/Output from previous day: 08/12 0701 - 08/13 0700 In: -  Out: 800 [Urine:800]  PHYSICAL EXAM General appearance: alert, cooperative and mild distress Resp: clear to auscultation bilaterally Cardio: regular rate and rhythm, S1, S2 normal, no murmur, click, rub or gallop GI: soft, non-tender; bowel sounds normal; no masses,  no organomegaly Extremities: extremities normal, atraumatic, no cyanosis or edema  Lab Results:  Results for orders placed or performed during the hospital encounter of 06/25/15 (from the past 48 hour(s))  CBC with Differential     Status: Abnormal   Collection Time: 06/25/15 10:21 PM  Result Value Ref Range   WBC 3.6 (L) 4.0 - 10.5 K/uL   RBC 4.23 3.87 - 5.11 MIL/uL   Hemoglobin 12.7 12.0 - 15.0 g/dL   HCT 39.7 36.0 - 46.0 %   MCV 93.9 78.0 - 100.0 fL   MCH 30.0 26.0 - 34.0 pg   MCHC 32.0 30.0 - 36.0 g/dL   RDW 14.2 11.5 - 15.5 %   Platelets 178 150 - 400 K/uL   Neutrophils Relative % 66 43 - 77 %   Neutro Abs 2.4 1.7 - 7.7 K/uL   Lymphocytes Relative 17 12 - 46 %   Lymphs Abs 0.6 (L) 0.7 - 4.0 K/uL   Monocytes Relative 17 (H) 3 - 12 %   Monocytes Absolute 0.6 0.1 - 1.0 K/uL   Eosinophils Relative 0 0 - 5 %   Eosinophils Absolute 0.0 0.0 - 0.7 K/uL   Basophils Relative 0 0 - 1 %   Basophils Absolute 0.0 0.0 - 0.1 K/uL  Basic metabolic panel     Status: Abnormal   Collection Time: 06/25/15 10:21 PM  Result Value Ref Range   Sodium 140 135 - 145 mmol/L   Potassium 4.2 3.5 - 5.1 mmol/L   Chloride 106 101 - 111 mmol/L   CO2 23 22 - 32 mmol/L   Glucose, Bld 145 (H) 65 - 99 mg/dL   BUN 23 (H) 6 - 20 mg/dL   Creatinine, Ser 0.85 0.44 - 1.00 mg/dL   Calcium 8.2 (L) 8.9 - 10.3 mg/dL   GFR calc non Af Amer 60 (L) >60 mL/min   GFR calc Af Amer >60 >60 mL/min    Comment: (NOTE) The eGFR has been calculated using the CKD EPI equation. This calculation has not been validated in all clinical situations. eGFR's persistently <60 mL/min signify possible Chronic Kidney Disease.    Anion gap 11 5 - 15  Urinalysis, Routine w reflex microscopic (not at  [facility])     Status: Abnormal   Collection Time: 06/25/15 10:35 PM  Result Value Ref Range   Color, Urine YELLOW YELLOW   APPearance CLEAR CLEAR   Specific Gravity, Urine >1.030 (H) 1.005 - 1.030   pH 6.0 5.0 - 8.0   Glucose, UA NEGATIVE NEGATIVE mg/dL   Hgb urine dipstick NEGATIVE NEGATIVE   Bilirubin Urine MODERATE (A) NEGATIVE   Ketones, ur >80 (A) NEGATIVE mg/dL   Protein, ur NEGATIVE NEGATIVE mg/dL   Urobilinogen, UA 0.2 0.0 - 1.0 mg/dL   Nitrite NEGATIVE NEGATIVE   Leukocytes, UA NEGATIVE NEGATIVE    Comment: MICROSCOPIC NOT DONE ON URINES WITH NEGATIVE PROTEIN, BLOOD, LEUKOCYTES, NITRITE, OR GLUCOSE <1000 mg/dL.  C difficile quick scan w PCR reflex     Status: None   Collection Time: 06/26/15  3:23 AM  Result Value Ref Range   C Diff antigen NEGATIVE NEGATIVE   C Diff toxin NEGATIVE NEGATIVE   C Diff interpretation Negative for toxigenic C. difficile   Basic  metabolic panel     Status: Abnormal   Collection Time: 06/26/15  5:56 AM  Result Value Ref Range   Sodium 144 135 - 145 mmol/L   Potassium 3.4 (L) 3.5 - 5.1 mmol/L    Comment: DELTA CHECK NOTED   Chloride 115 (H) 101 - 111 mmol/L   CO2 23 22 - 32 mmol/L   Glucose, Bld 127 (H) 65 - 99 mg/dL   BUN 22 (H) 6 - 20 mg/dL   Creatinine, Ser 0.72 0.44 - 1.00 mg/dL   Calcium 7.2 (L) 8.9 - 10.3 mg/dL   GFR calc non Af Amer >60 >60 mL/min   GFR calc Af Amer >60 >60 mL/min    Comment: (NOTE) The eGFR has been calculated using the CKD EPI equation. This calculation has not been validated in all clinical situations. eGFR's persistently <60 mL/min signify possible Chronic Kidney Disease.    Anion gap 6 5 - 15  CBC     Status: Abnormal   Collection Time: 06/26/15  5:56 AM  Result Value Ref Range   WBC 3.2 (L) 4.0 - 10.5 K/uL   RBC 3.71 (L) 3.87 - 5.11 MIL/uL   Hemoglobin 11.2 (L) 12.0 - 15.0 g/dL   HCT 35.0 (L) 36.0 - 46.0 %   MCV 94.3 78.0 - 100.0 fL   MCH 30.2 26.0 - 34.0 pg   MCHC 32.0 30.0 - 36.0 g/dL   RDW 14.3 11.5 - 15.5 %   Platelets 165 150 - 400 K/uL    ABGS No results for input(s): PHART, PO2ART, TCO2, HCO3 in the last 72 hours.  Invalid input(s): PCO2 CULTURES Recent Results (from the past 240 hour(s))  C difficile quick scan w PCR reflex     Status: None   Collection Time: 06/26/15  3:23 AM  Result Value Ref Range Status   C Diff antigen NEGATIVE NEGATIVE Final   C Diff toxin NEGATIVE NEGATIVE Final   C Diff interpretation Negative for toxigenic C. difficile  Final   Studies/Results: No results found.  Medications:  Prior to Admission:  Prescriptions prior to admission  Medication Sig Dispense Refill Last Dose  . gabapentin (NEURONTIN) 100 MG capsule Take 200 mg by mouth at bedtime.   11 06/24/2015 at Unknown time  . meclizine (ANTIVERT) 25 MG tablet Take 25 mg by mouth at bedtime.   06/24/2015 at Unknown time  . omeprazole (PRILOSEC) 20 MG capsule Take 1 capsule  (20 mg total) by  mouth daily. 60 capsule 2 06/24/2015 at Unknown time  . ondansetron (ZOFRAN ODT) 4 MG disintegrating tablet 17m ODT q4 hours prn nausea/vomit 12 tablet 0 06/25/2015 at Unknown time   Scheduled: . feeding supplement (ENSURE ENLIVE)  237 mL Oral BID BM  . gabapentin  200 mg Oral QHS  . meclizine  25 mg Oral QHS  . metroNIDAZOLE  500 mg Oral 3 times per day   Continuous: . sodium chloride 75 mL/hr at 06/26/15 0230   POBS:[id number]**OR** ondansetron (ZOFRAN) IV  Assesment: She was admitted with diarrhea which has improved. She was on antibiotics for Helicobacter pylori infection but that's been about 3 months ago.  She has dehydration which I think is multi-factorial including her diarrhea and her inability to swallow. She has what appears to be an air-filled flaccid [facility]  She has been diagnosed with peripheral neuropathy but I'm concerned that her underlying problem is a neurological disease such as ALS, myasthenia gravis etc.  She has lost a significant amount of weight because of this problem and she has moderate malnutrition from that Principal Problem:   Diarrhea Active Problems:   Dehydration   Weakness generalized   Vertigo    Plan: Continue work for hydration. She is to stay on IV fluids. She can eat only very little. She had swallow evaluation with upper GI but I think she is at pretty high risk of aspiration at this time. We'll continue with her current diet. Request neurology consultation. I don't know if an MRI of the brain will add anything because this appears to be more of a lower motor neuron problem      Gualberto Wahlen L 06/26/2015, 10:12 AM

## 2015-06-26 NOTE — Progress Notes (Addendum)
Initial Nutrition Assessment  DOCUMENTATION CODES:  Non-severe (moderate) malnutrition in context of chronic illness   Pt meets criteria for Moderate MALNUTRITION in the context of Chronic Illness as evidenced by loss of >10% bw in 6 months and an estimated intake the met 75% or less of estimated needs for atleast 1 month.  INTERVENTION:  Ensure Enlive po BID, each supplement provides 350 kcal and 20 grams of protein  -For non-infectious diarrhea, Recommend Soluble fiber supplement- such as Benefiber and probiotic  -Recommend MVI with minerals  NUTRITION DIAGNOSIS:  Inadequate oral intake related to altered GI function as evidenced by loss of 12% bw in 4 months  GOAL:  Patient will meet greater than or equal to 90% of their needs  MONITOR:  PO intake, Supplement acceptance, Labs, I & O's, Weight trends, Skin (Work up)  REASON FOR ASSESSMENT:  Malnutrition Screening Tool    ASSESSMENT:  81 yo female PMHx: Osteoporosis, gastritis, vertigo and neuropathy presents with diarrhea nonbloody for 3 days progressively getting worse. Mildly dehydrated  RD operating remotely, per notes: pt cannot eat or drink, and feels nauseous when talking.  Will assume she is eating 75% or less of needs.   Pt states loss of 3 lbs last month. Lost 11 lbs since December.   Diet Order:  Diet Heart Room service appropriate?: Yes; Fluid consistency:: Thin  Skin:  Mild MSAD: Groin/sacrum  Last BM:  8/13-watery  Height:  Ht Readings from Last 1 Encounters:  06/26/15 5' 5"  (1.651 m)   Weight:  Wt Readings from Last 1 Encounters:  06/26/15 112 lb 3.2 oz (50.894 kg)   Wt Readings from Last 10 Encounters:  06/26/15 112 lb 3.2 oz (50.894 kg)  06/24/15 109 lb (49.442 kg)  02/19/15 124 lb (56.246 kg)  02/01/15 124 lb (56.246 kg)  01/27/15 124 lb (56.246 kg)  Admit weight: 109 lbs  Ideal Body Weight:  56.82 kg  BMI:  Body mass index is 18.67 kg/(m^2).  Estimated Nutritional Needs:  Kcal:   1400-1500 (28-30 kcal/kg) Protein:  62-74 g (1.1-1.3 g/kg IBW)  Fluid:  1.4-1.5 liters  EDUCATION NEEDS:  No education needs identified at this time  Burtis Junes RD, LDN Nutrition Pager: [phone] 06/26/2015 9:24 AM

## 2015-06-26 NOTE — H&P (Signed)
PCP:   Fredirick Maudlin, MD   Chief Complaint:  diarrhea  HPI: 81 yo female with diarrhea nonbloody for 3 days progressively getting worse.  Pt denies fevers.  No n/v or abdominal pain.  No sick contacts, no recent antibiotics or hospitalizations.  She feels very weak especially upon standing.  Referred for admission for her diarrhea and dehydration.  Review of Systems:  Positive and negative as per HPI otherwise all other systems are negative  Past Medical History: Past Medical History  Diagnosis Date  . Osteoporosis   . Vertigo   . Neuropathy   . Candida esophagitis APR 2016  . Helicobacter pylori gastritis APR 2016    treated with amox and biaxin   Past Surgical History  Procedure Laterality Date  . Partial hysterectomy    . Esophagogastroduodenoscopy N/A 02/19/2015    Dr.Fields- dilated proximal esophagus, possible mid- esophageal web, mild non-erosive gastritis bx= chronic gastritis with focal activity and hpylori  . Esophageal dilation N/A 02/19/2015    Procedure: ESOPHAGEAL DILATION;  Surgeon: West Bali, MD;  Location: [facility];  Service: Endoscopy;  Laterality: N/A;    Medications: Prior to Admission medications   Medication Sig Start Date End Date Taking? Authorizing Provider  gabapentin (NEURONTIN) 100 MG capsule Take 200 mg by mouth at bedtime.  01/01/15  Yes Historical Provider, MD  meclizine (ANTIVERT) 25 MG tablet Take 25 mg by mouth at bedtime.   Yes Historical Provider, MD  omeprazole (PRILOSEC) 20 MG capsule Take 1 capsule (20 mg total) by mouth daily. 04/06/15  Yes Tiffany Kocher, PA-C  ondansetron (ZOFRAN ODT) 4 MG disintegrating tablet 4mg  ODT q4 hours prn nausea/vomit 06/24/15  Yes Bethann Berkshire, MD    Allergies:  No Known Allergies  Social History:  reports that she has never smoked. She does not have any smokeless tobacco history on file. She reports that she does not drink alcohol or use illicit drugs.  Family History: Family History   Problem Relation Age of Onset  . Colon cancer Neg Hx     Physical Exam: Filed Vitals:   06/26/15 0000 06/26/15 0030 06/26/15 0100 06/26/15 0152  BP: 143/72 127/52 131/62 113/57  Pulse: 93 86 85 84  Temp:    97.7 F (36.5 C)  TempSrc:    Oral  Resp: 28 20 23    Height:    5\' 5"  (1.651 m)  Weight:    50.894 kg (112 lb 3.2 oz)  SpO2: 99% 95% 93% 97%   General appearance: alert, cooperative and no distress Head: Normocephalic, without obvious abnormality, atraumatic Eyes: negative Nose: Nares normal. Septum midline. Mucosa normal. No drainage or sinus tenderness. Neck: no JVD and supple, symmetrical, trachea midline Lungs: clear to auscultation bilaterally Heart: regular rate and rhythm, S1, S2 normal, no murmur, click, rub or gallop Abdomen: soft, non-tender; bowel sounds normal; no masses,  no organomegaly Extremities: extremities normal, atraumatic, no cyanosis or edema Pulses: 2+ and symmetric Skin: Skin color, texture, turgor normal. No rashes or lesions Neurologic: Grossly normal  Labs on Admission:   Recent Labs  06/24/15 2200 06/25/15 2221  NA 137 140  K 4.2 4.2  CL 101 106  CO2 26 23  GLUCOSE 198* 145*  BUN 20 23*  CREATININE 0.92 0.85  CALCIUM 9.6 8.2*    Recent Labs  06/24/15 2200  AST 25  ALT 22  ALKPHOS 77  BILITOT 0.9  PROT 7.4  ALBUMIN 4.1    Recent Labs  06/24/15 2200 06/25/15  2221  WBC 10.4 3.6*  NEUTROABS 9.5* 2.4  HGB 13.9 12.7  HCT 42.2 39.7  MCV 93.6 93.9  PLT 195 178    Radiological Exams on Admission: Dg Chest 2 View  06/01/2015   CLINICAL DATA:  Fall with non painful knot near the left breast. Initial encounter.  EXAM: CHEST  2 VIEW  COMPARISON:  None.  FINDINGS: Chronic marked esophageal distention with gas, especially the thoracic inlet, displacing the trachea. The stomach is also gas dilated with fluid level.  Elevation of the left diaphragm, likely from gastric distention.  There is no edema, consolidation, effusion, or  pneumothorax. Normal heart size. Negative aortic contours.  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  IMPRESSION: 1. No active cardiopulmonary disease. 2. Chronic marked distension of the esophagus, reference esophagram 04/13/2015.   Electronically Signed   By: Marnee Spring M.D.   On: 06/01/2015 10:44    Assessment/Plan  81 yo female with diarrhea and mild dehydration  Principal Problem:   Diarrhea-  Ck stool culture and cdiff.  Cover for presumptive cdiff (nurse reports smells like cdiff) with po flagyl.  Contact precautions.  abd exam is normal.  Active Problems:   Dehydration-  ivf   Weakness generalized- seoondary to above   Vertigo- chronic, cont meclizine  obs on medical bed.  pcp dr Juanetta Gosling.  Orion Vandervort A 06/26/2015, 2:27 AM

## 2015-06-27 MED ORDER — POTASSIUM CHLORIDE 20 MEQ/15ML (10%) PO SOLN
20.0000 meq | Freq: Every day | ORAL | Status: DC
  Administered 2015-06-27 – 2015-06-29 (×3): 20 meq via ORAL
  Filled 2015-06-27 (×3): qty 30

## 2015-06-27 MED ORDER — GABAPENTIN 100 MG PO CAPS
100.0000 mg | ORAL_CAPSULE | Freq: Once | ORAL | Status: DC | PRN
  Administered 2015-06-28: 100 mg via ORAL
  Filled 2015-06-27: qty 1

## 2015-06-27 NOTE — Progress Notes (Signed)
Subjective: She says she feels a little better. She still has significant trouble swallowing. She was able to walk a little bit so her weakness has improved,no diarrhea  Objective: Vital signs in last 24 hours: Temp:  [97.4 F (36.3 C)-98 F (36.7 C)] 97.4 F (36.3 C) (08/14 0558) Pulse Rate:  [60-68] 60 (08/14 0800) Resp:  [18-24] 24 (08/14 0800) BP: (90-93)/(38-47) 93/47 mmHg (08/14 0558) SpO2:  [97 %-100 %] 97 % (08/14 0558) Weight:  [50.894 kg (112 lb 3.2 oz)] 50.894 kg (112 lb 3.2 oz) (08/14 0558) Weight change: 0 kg (0 lb) Last BM Date: 06/26/15  Intake/Output from previous day: 08/13 0701 - 08/14 0700 In: 1177.5 [P.O.:240; I.V.:937.5] Out: 300 [Urine:300]  PHYSICAL EXAM General appearance: alert, cooperative and mild distress Resp: clear to auscultation bilaterally Cardio: regular rate and rhythm, S1, S2 normal, no murmur, click, rub or gallop GI: soft, non-tender; bowel sounds normal; no masses,  no organomegaly Extremities: extremities normal, atraumatic, no cyanosis or edema  Lab Results:  Results for orders placed or performed during the hospital encounter of 06/25/15 (from the past 48 hour(s))  CBC with Differential     Status: Abnormal   Collection Time: 06/25/15 10:21 PM  Result Value Ref Range   WBC 3.6 (Horn) 4.0 - 10.5 K/uL   RBC 4.23 3.87 - 5.11 MIL/uL   Hemoglobin 12.7 12.0 - 15.0 g/dL   HCT 39.7 36.0 - 46.0 %   MCV 93.9 78.0 - 100.0 fL   MCH 30.0 26.0 - 34.0 pg   MCHC 32.0 30.0 - 36.0 g/dL   RDW 14.2 11.5 - 15.5 %   Platelets 178 150 - 400 K/uL   Neutrophils Relative % 66 43 - 77 %   Neutro Abs 2.4 1.7 - 7.7 K/uL   Lymphocytes Relative 17 12 - 46 %   Lymphs Abs 0.6 (Horn) 0.7 - 4.0 K/uL   Monocytes Relative 17 (H) 3 - 12 %   Monocytes Absolute 0.6 0.1 - 1.0 K/uL   Eosinophils Relative 0 0 - 5 %   Eosinophils Absolute 0.0 0.0 - 0.7 K/uL   Basophils Relative 0 0 - 1 %   Basophils Absolute 0.0 0.0 - 0.1 K/uL  Basic metabolic panel     Status: Abnormal    Collection Time: 06/25/15 10:21 PM  Result Value Ref Range   Sodium 140 135 - 145 mmol/Horn   Potassium 4.2 3.5 - 5.1 mmol/Horn   Chloride 106 101 - 111 mmol/Horn   CO2 23 22 - 32 mmol/Horn   Glucose, Bld 145 (H) 65 - 99 mg/dL   BUN 23 (H) 6 - 20 mg/dL   Creatinine, Ser 0.85 0.44 - 1.00 mg/dL   Calcium 8.2 (Horn) 8.9 - 10.3 mg/dL   GFR calc non Af Amer 60 (Horn) >60 mL/min   GFR calc Af Amer >60 >60 mL/min    Comment: (NOTE) The eGFR has been calculated using the CKD EPI equation. This calculation has not been validated in all clinical situations. eGFR's persistently <60 mL/min signify possible Chronic Kidney Disease.    Anion gap 11 5 - 15  Urinalysis, Routine w reflex microscopic (not at [facility])     Status: Abnormal   Collection Time: 06/25/15 10:35 PM  Result Value Ref Range   Color, Urine YELLOW YELLOW   APPearance CLEAR CLEAR   Specific Gravity, Urine >1.030 (H) 1.005 - 1.030   pH 6.0 5.0 - 8.0   Glucose, UA NEGATIVE NEGATIVE mg/dL   Hgb urine  dipstick NEGATIVE NEGATIVE   Bilirubin Urine MODERATE (A) NEGATIVE   Ketones, ur >80 (A) NEGATIVE mg/dL   Protein, ur NEGATIVE NEGATIVE mg/dL   Urobilinogen, UA 0.2 0.0 - 1.0 mg/dL   Nitrite NEGATIVE NEGATIVE   Leukocytes, UA NEGATIVE NEGATIVE    Comment: MICROSCOPIC NOT DONE ON URINES WITH NEGATIVE PROTEIN, BLOOD, LEUKOCYTES, NITRITE, OR GLUCOSE <1000 mg/dL.  C difficile quick scan w PCR reflex     Status: None   Collection Time: 06/26/15  3:23 AM  Result Value Ref Range   C Diff antigen NEGATIVE NEGATIVE   C Diff toxin NEGATIVE NEGATIVE   C Diff interpretation Negative for toxigenic C. difficile   Basic metabolic panel     Status: Abnormal   Collection Time: 06/26/15  5:56 AM  Result Value Ref Range   Sodium 144 135 - 145 mmol/Horn   Potassium 3.4 (Horn) 3.5 - 5.1 mmol/Horn    Comment: DELTA CHECK NOTED   Chloride 115 (H) 101 - 111 mmol/Horn   CO2 23 22 - 32 mmol/Horn   Glucose, Bld 127 (H) 65 - 99 mg/dL   BUN 22 (H) 6 - 20 mg/dL   Creatinine, Ser  0.72 0.44 - 1.00 mg/dL   Calcium 7.2 (Horn) 8.9 - 10.3 mg/dL   GFR calc non Af Amer >60 >60 mL/min   GFR calc Af Amer >60 >60 mL/min    Comment: (NOTE) The eGFR has been calculated using the CKD EPI equation. This calculation has not been validated in all clinical situations. eGFR's persistently <60 mL/min signify possible Chronic Kidney Disease.    Anion gap 6 5 - 15  CBC     Status: Abnormal   Collection Time: 06/26/15  5:56 AM  Result Value Ref Range   WBC 3.2 (Horn) 4.0 - 10.5 K/uL   RBC 3.71 (Horn) 3.87 - 5.11 MIL/uL   Hemoglobin 11.2 (Horn) 12.0 - 15.0 g/dL   HCT 35.0 (Horn) 36.0 - 46.0 %   MCV 94.3 78.0 - 100.0 fL   MCH 30.2 26.0 - 34.0 pg   MCHC 32.0 30.0 - 36.0 g/dL   RDW 14.3 11.5 - 15.5 %   Platelets 165 150 - 400 K/uL  Glucose, capillary     Status: None   Collection Time: 06/26/15  4:43 PM  Result Value Ref Range   Glucose-Capillary 89 65 - 99 mg/dL   Comment 1 Notify RN    Comment 2 Document in Chart     ABGS No results for input(s): PHART, PO2ART, TCO2, HCO3 in the last 72 hours.  Invalid input(s): PCO2 CULTURES Recent Results (from the past 240 hour(s))  C difficile quick scan w PCR reflex     Status: None   Collection Time: 06/26/15  3:23 AM  Result Value Ref Range Status   C Diff antigen NEGATIVE NEGATIVE Final   C Diff toxin NEGATIVE NEGATIVE Final   C Diff interpretation Negative for toxigenic C. difficile  Final   Studies/Results: No results found.  Medications:  Prior to Admission:  Prescriptions prior to admission  Medication Sig Dispense Refill Last Dose  . gabapentin (NEURONTIN) 100 MG capsule Take 200 mg by mouth at bedtime.   11 06/24/2015 at Unknown time  . meclizine (ANTIVERT) 25 MG tablet Take 25 mg by mouth at bedtime.   06/24/2015 at Unknown time  . omeprazole (PRILOSEC) 20 MG capsule Take 1 capsule (20 mg total) by mouth daily. 60 capsule 2 06/24/2015 at Unknown time  . ondansetron (ZOFRAN ODT)  4 MG disintegrating tablet 46m ODT q4 hours prn  nausea/vomit 12 tablet 0 06/25/2015 at Unknown time   Scheduled: . feeding supplement (ENSURE ENLIVE)  237 mL Oral BID BM  . gabapentin  200 mg Oral QHS  . meclizine  25 mg Oral QHS  . potassium chloride  20 mEq Oral Daily   Continuous: . sodium chloride 75 mL/hr at 06/26/15 2138   PMBE:[id number]**OR** ondansetron (ZOFRAN) IV  Assesment: She was admitted with generalized weakness. I think she has a neuromuscular disease. She has severe trouble swallowing. She had generalized weakness. She is slightly better with the weakness. She had dehydration and I don't think she is really able to keep up with her fluid needs orally at this point. She has malnutrition from her difficulty swallowing and consequent poor oral intake. Principal Problem:   Diarrhea Active Problems:   Dehydration   Weakness generalized   Vertigo   Malnutrition of moderate degree    Plan: Continue current treatments continue IV fluids I have requested neurology consultation. She has pending lab work.      Nancy Horn 06/27/2015, 10:20 AM

## 2015-06-28 ENCOUNTER — Observation Stay (HOSPITAL_COMMUNITY): Payer: Medicare Other

## 2015-06-28 LAB — BASIC METABOLIC PANEL
ANION GAP: 4 — AB (ref 5–15)
BUN: 19 mg/dL (ref 6–20)
CALCIUM: 7.8 mg/dL — AB (ref 8.9–10.3)
CHLORIDE: 116 mmol/L — AB (ref 101–111)
CO2: 25 mmol/L (ref 22–32)
Creatinine, Ser: 0.64 mg/dL (ref 0.44–1.00)
GFR calc non Af Amer: >60 mL/min (ref >60)
GLUCOSE: 84 mg/dL (ref 65–99)
Potassium: 3.8 mmol/L (ref 3.5–5.1)
Sodium: 145 mmol/L (ref 135–145)

## 2015-06-28 LAB — STRIATED MUSCLE ANTIBODY: Anti-striation Abs: NEGATIVE

## 2015-06-28 MED ORDER — ALPRAZOLAM 0.5 MG PO TABS
0.5000 mg | ORAL_TABLET | Freq: Once | ORAL | Status: AC
  Administered 2015-06-28: 0.5 mg via ORAL
  Filled 2015-06-28: qty 1

## 2015-06-28 NOTE — Consult Note (Signed)
Rossiter A. Merlene Laughter, MD     [email or web address]          Nancy Horn is an 81 y.o. female.   ASSESSMENT/PLAN: 1. Unexplained dysphagia. Apparently, there is no clear etiology related to dysfunction or pathology of the esophagus or stomach. The relatively acute onset suggests the possibility of ischemic stroke. I do not get the sense that she has been neuromuscular problem although this should be worked up.  RECOMMENDATION: Brain MRI. There is no lab tests for the following: Acetylcholine receptor antibodies antibodies binding, blocking and modulating, CPK and vitamin B12 level.  The patient presents with about a 4 months history of difficulty swallowing. The patient reports that the onset was sudden over the course of the day. She is persistent symptoms with no improvement over the last few months since the onset March of this year. She may have had some worsening over the last 2 months however. She apparently has been worked up extensively here by Tax inspector and Also Seen at [facility]. Apparently No Clear Etiology Has Been Uncovered Although She Tells Me That There May Be Some Mobility Dysfunction As Etiology. There Is No Clear Obstructive or Anatomical Issues However. Patient Does Not Report Having Focal Neurological Deficit Especially the Onset of These Symptoms. She Denies Focal Numbness, Weakness (except for possibly left leg weakness she noticed today which was picked up on examination), Dysarthria, Blurred Vision or Diplopia. She Has Had Some Dizziness However. She denies chest pain or shows of breath. There is no history of headaches. The patient presented to the hospital with a 3 day history of diarrhea. This has improved since being admitted to the hospital. The review systems otherwise negative.  GENERAL: Pleasant thin female in no acute distress.  HEENT: Supple. Atraumatic normocephalic. No tongue fasciculations. No atrophy.  ABDOMEN:  soft  EXTREMITIES: No edema   BACK: Normal.  SKIN: Normal by inspection.    MENTAL STATUS: Alert and oriented. Speech, language and cognition are generally intact. Judgment and insight normal.   CRANIAL NERVES: Pupils are equal, round and reactive to light and accommodation; extra ocular movements are full, there is no significant nystagmus; visual fields are full; upper and lower facial muscles are normal in strength and symmetric - in particular there is no weakness of the obicularis oris muscles, there is no flattening of the nasolabial folds; tongue is midline; uvula is midline; shoulder elevation is normal.  MOTOR: Normal tone, bulk and strength arm; no pronator drift. Left leg 4+/5 with maybe some pain. Right leg 5/5. Bulk and tone are normal.  COORDINATION: Left finger to nose is normal, right finger to nose is normal, No rest tremor; no intention tremor; no postural tremor; no bradykinesia.  REFLEXES: Deep tendon reflexes are symmetrical and normal. Babinski reflexes are flexor bilaterally.   SENSATION: Normal to light touch.   Blood pressure 105/44, pulse 71, temperature 98.1 F (36.7 C), temperature source Oral, resp. rate 20, height 5' 5"  (1.651 m), weight 51.302 kg (113 lb 1.6 oz), SpO2 97 %.  Past Medical History  Diagnosis Date  . Osteoporosis   . Vertigo   . Neuropathy   . Candida esophagitis APR 2016  . Helicobacter pylori gastritis APR 2016    treated with amox and biaxin    Past Surgical History  Procedure Laterality Date  . Partial hysterectomy    . Esophagogastroduodenoscopy N/A 02/19/2015    Dr.Fields- dilated proximal esophagus, possible mid- esophageal web, mild non-erosive gastritis bx= chronic gastritis with  focal activity and hpylori  . Esophageal dilation N/A 02/19/2015    Procedure: ESOPHAGEAL DILATION;  Surgeon: Danie Binder, MD;  Location: [facility];  Service: Endoscopy;  Laterality: N/A;    Family History  Problem Relation Age of Onset  .  Colon cancer Neg Hx     Social History:  reports that she has never smoked. She does not have any smokeless tobacco history on file. She reports that she does not drink alcohol or use illicit drugs.  Allergies: No Known Allergies  Medications: Prior to Admission medications   Medication Sig Start Date End Date Taking? Authorizing Provider  gabapentin (NEURONTIN) 100 MG capsule Take 200 mg by mouth at bedtime.  01/01/15  Yes Historical Provider, MD  meclizine (ANTIVERT) 25 MG tablet Take 25 mg by mouth at bedtime.   Yes Historical Provider, MD  omeprazole (PRILOSEC) 20 MG capsule Take 1 capsule (20 mg total) by mouth daily. 04/06/15  Yes Mahala Menghini, PA-C  ondansetron (ZOFRAN ODT) 4 MG disintegrating tablet 80m ODT q4 hours prn nausea/vomit 06/24/15  Yes JMilton Ferguson MD    Scheduled Meds: . feeding supplement (ENSURE ENLIVE)  237 mL Oral BID BM  . gabapentin  200 mg Oral QHS  . meclizine  25 mg Oral QHS  . potassium chloride  20 mEq Oral Daily   Continuous Infusions: . sodium chloride 75 mL/hr at 06/28/15 0011   PRN Meds:.gabapentin, ondansetron **OR** ondansetron (ZOFRAN) IV     Results for orders placed or performed during the hospital encounter of 06/25/15 (from the past 48 hour(s))  Glucose, capillary     Status: None   Collection Time: 06/26/15  4:43 PM  Result Value Ref Range   Glucose-Capillary 89 65 - 99 mg/dL   Comment 1 Notify RN    Comment 2 Document in Chart   Basic metabolic panel     Status: Abnormal   Collection Time: 06/28/15  5:34 AM  Result Value Ref Range   Sodium 145 135 - 145 mmol/L   Potassium 3.8 3.5 - 5.1 mmol/L   Chloride 116 (H) 101 - 111 mmol/L   CO2 25 22 - 32 mmol/L   Glucose, Bld 84 65 - 99 mg/dL   BUN 19 6 - 20 mg/dL   Creatinine, Ser 0.64 0.44 - 1.00 mg/dL   Calcium 7.8 (L) 8.9 - 10.3 mg/dL   GFR calc non Af Amer >60 >60 mL/min   GFR calc Af Amer >60 >60 mL/min    Comment: (NOTE) The eGFR has been calculated using the CKD EPI  equation. This calculation has not been validated in all clinical situations. eGFR's persistently <60 mL/min signify possible Chronic Kidney Disease.    Anion gap 4 (L) 5 - 15    Studies/Results:  Barium swallow 03-2015 Markedly dilated air-filled and hypotonic thoracic esophagus without distal esophageal obstruction, question hypotonia versus sequela of achalasia with restoration of gastroesophageal junction patency following recent endoscopy/dilatation.  Laryngeal penetration and aspiration of contrast with note of a spontaneous cough reflex.      Kashden Deboy A. DMerlene Laughter M.D.  Diplomate, ATax adviserof Psychiatry and Neurology ( Neurology). 06/28/2015, 1:24 PM

## 2015-06-28 NOTE — Care Management Note (Signed)
Case Management Note  Patient Details  Name: Nancy Horn MRN: [id number] Date of Birth: 07-02-1925  Expected Discharge Date:  06/28/2015            Expected Discharge Plan:  Home w Home Health Services  In-House Referral:  NA  Discharge planning Services  CM Consult  Post Acute Care Choice:  Home Health, Durable Medical Equipment Choice offered to:  Patient  DME Arranged:  [place] rolling DME Agency:  [facility]  HH Arranged:  PT [facility] Agency:  [facility]  Status of Service:  Completed, signed off  Medicare Important Message Given:    Date Medicare IM Given:    Medicare IM give by:    Date Additional Medicare IM Given:    Additional Medicare Important Message give by:     If discussed at Long Length of Stay Meetings, dates discussed:    Additional Comments: Patient is from home and independent at baseline. Pt has no HH services or DME needs prior to admission. Pt uses a can as needed. Pt plans to return home with [facility] PT and needs a RW. Pt has chosen [facility] for [facility] and DME needs. Alroy Bailiff has been made aware of HH referral and Lorinda Creed has been made aware of DME referral, both will obtain pt info from chart. Dan Humphreys will be delivered to pt's room prior to DC. Pt aware [facility] has 48 hours to make there first visit. No further CM needs at this time. Pt plans to discharge home today and [facility] is aware.  Malcolm Metro, RN 06/28/2015, 1:23 PM

## 2015-06-28 NOTE — Progress Notes (Signed)
Subjective: She says she feels a little better. She has no new complaints. She can swallow a little more food. She is still very weak  Objective: Vital signs in last 24 hours: Temp:  [97.5 F (36.4 C)-98.5 F (36.9 C)] 98.1 F (36.7 C) (08/15 0607) Pulse Rate:  [57-71] 71 (08/15 0607) Resp:  [20] 20 (08/15 0607) BP: (103-115)/(44-79) 105/44 mmHg (08/15 0607) SpO2:  [97 %-100 %] 97 % (08/15 0607) Weight:  [51.302 kg (113 lb 1.6 oz)] 51.302 kg (113 lb 1.6 oz) (08/15 0607) Weight change: 0.408 kg (14.4 oz) Last BM Date: 06/26/15  Intake/Output from previous day: 08/14 0701 - 08/15 0700 In: 680 [P.O.:680] Out: 700 [Urine:700]  PHYSICAL EXAM General appearance: alert, cooperative and mild distress Resp: clear to auscultation bilaterally Cardio: regular rate and rhythm, S1, S2 normal, no murmur, click, rub or gallop GI: soft, non-tender; bowel sounds normal; no masses,  no organomegaly Extremities: extremities normal, atraumatic, no cyanosis or edema  Lab Results:  Results for orders placed or performed during the hospital encounter of 06/25/15 (from the past 48 hour(s))  Glucose, capillary     Status: None   Collection Time: 06/26/15  4:43 PM  Result Value Ref Range   Glucose-Capillary 89 65 - 99 mg/dL   Comment 1 Notify RN    Comment 2 Document in Chart   Basic metabolic panel     Status: Abnormal   Collection Time: 06/28/15  5:34 AM  Result Value Ref Range   Sodium 145 135 - 145 mmol/L   Potassium 3.8 3.5 - 5.1 mmol/L   Chloride 116 (H) 101 - 111 mmol/L   CO2 25 22 - 32 mmol/L   Glucose, Bld 84 65 - 99 mg/dL   BUN 19 6 - 20 mg/dL   Creatinine, Ser 0.64 0.44 - 1.00 mg/dL   Calcium 7.8 (L) 8.9 - 10.3 mg/dL   GFR calc non Af Amer >60 >60 mL/min   GFR calc Af Amer >60 >60 mL/min    Comment: (NOTE) The eGFR has been calculated using the CKD EPI equation. This calculation has not been validated in all clinical situations. eGFR's persistently <60 mL/min signify possible  Chronic Kidney Disease.    Anion gap 4 (L) 5 - 15    ABGS No results for input(s): PHART, PO2ART, TCO2, HCO3 in the last 72 hours.  Invalid input(s): PCO2 CULTURES Recent Results (from the past 240 hour(s))  C difficile quick scan w PCR reflex     Status: None   Collection Time: 06/26/15  3:23 AM  Result Value Ref Range Status   C Diff antigen NEGATIVE NEGATIVE Final   C Diff toxin NEGATIVE NEGATIVE Final   C Diff interpretation Negative for toxigenic C. difficile  Final   Studies/Results: No results found.  Medications:  Prior to Admission:  Prescriptions prior to admission  Medication Sig Dispense Refill Last Dose  . gabapentin (NEURONTIN) 100 MG capsule Take 200 mg by mouth at bedtime.   11 06/24/2015 at Unknown time  . meclizine (ANTIVERT) 25 MG tablet Take 25 mg by mouth at bedtime.   06/24/2015 at Unknown time  . omeprazole (PRILOSEC) 20 MG capsule Take 1 capsule (20 mg total) by mouth daily. 60 capsule 2 06/24/2015 at Unknown time  . ondansetron (ZOFRAN ODT) 4 MG disintegrating tablet 28m ODT q4 hours prn nausea/vomit 12 tablet 0 06/25/2015 at Unknown time   Scheduled: . feeding supplement (ENSURE ENLIVE)  237 mL Oral BID BM  . gabapentin  200 mg Oral QHS  . meclizine  25 mg Oral QHS  . potassium chloride  20 mEq Oral Daily   Continuous: . sodium chloride 75 mL/hr at 06/28/15 0011   JPV:[id number], ondansetron **OR** ondansetron (ZOFRAN) IV  Assesment: She was admitted with diarrhea which has resolved. She was negative for Clostridium difficile. She was dehydrated and has been receiving IV fluids and that is better. She has had a several month history of difficulty swallowing and this appears to be related to a neurological problem and not a GI problem. Because of her difficulty swallowing she's not been able to eat and she has malnutrition Principal Problem:   Diarrhea Active Problems:   Dehydration   Weakness generalized   Vertigo   Malnutrition of moderate  degree    Plan: I have requested neurology consultation. Discussed with Dr. Merlene Laughter. She may be able to be discharged later today if she doesn't need any inpatient procedures      Ariyel Jeangilles L 06/28/2015, 8:20 AM

## 2015-06-28 NOTE — Evaluation (Signed)
Physical Therapy Evaluation Patient Details Name: Nancy Horn MRN: [id number] DOB: 1925-04-13 Today's Date: 06/28/2015   History of Present Illness  81 yo female with diarrhea nonbloody for 3 days progressively getting worse. Pt denies fevers. No n/v or abdominal pain. No sick contacts, no recent antibiotics or hospitalizations. She feels very weak especially upon standing. Referred for admission for her diarrhea and dehydration.  Pt lives alone and is normally very active.  She has been using a cane for gait recently due to decreased balance.  Clinical Impression  Pt was seen for evaluation.  She was very pleasant and cooperative.  She was found to be generally weak and deconditioned due to recent illness.  She also has decreased dynamic standing balance.  She now needs a walker for gait stability.  I am recommending HHPT for general strengthening and balance focus as well as gait training with a walker.    Follow Up Recommendations Home health PT    Equipment Recommendations  Rolling walker with 5" wheels    Recommendations for Other Services   none    Precautions / Restrictions Precautions Precautions: Fall Restrictions Weight Bearing Restrictions: No      Mobility  Bed Mobility Overal bed mobility: Modified Independent                Transfers Overall transfer level: Modified independent                  Ambulation/Gait Ambulation/Gait assistance: Modified independent (Device/Increase time) Ambulation Distance (Feet): 150 Feet Assistive device: Rolling walker (2 wheeled) Gait Pattern/deviations: Decreased dorsiflexion - right;Decreased dorsiflexion - left;Decreased stride length   Gait velocity interpretation: <1.8 ft/sec, indicative of risk for recurrent falls    Stairs                     Balance Overall balance assessment: Needs assistance Sitting-balance support: No upper extremity supported;Feet supported Sitting  balance-Leahy Scale: Good     Standing balance support: No upper extremity supported Standing balance-Leahy Scale: Fair Standing balance comment: pt is unable to tolerate any challenge to standing without falling backward                             Pertinent Vitals/Pain Pain Assessment: No/denies pain    Home Living Family/patient expects to be discharged to:: Private residence Living Arrangements: Alone Available Help at Discharge: Family;Available PRN/intermittently Type of Home: House Home Access: Level entry     Home Layout: One level Home Equipment: Cane - single point      Prior Function Level of Independence: Independent with assistive device(s)         Comments: 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...just recently has noticed decreased standing and walking balance and is using a cane for gait at times     Hand Dominance        Extremity/Trunk Assessment               Lower Extremity Assessment: Generalized weakness (significant weakness in gastrocnemii)      Cervical / Trunk Assessment: Kyphotic  Communication   Communication: No difficulties (has difficulty swallowing)  Cognition Arousal/Alertness: Awake/alert Behavior During Therapy: WFL for tasks assessed/performed Overall Cognitive Status: Within Functional Limits for tasks assessed  Assessment/Plan    PT Assessment All further PT needs can be met in the next venue of care  PT Diagnosis Abnormality of gait;Generalized weakness   PT Problem List Decreased balance;Decreased strength;Decreased knowledge of use of DME  PT Treatment Interventions     PT Goals (Current goals can be found in the Care Plan section) Acute Rehab PT Goals PT Goal Formulation: All assessment and education complete, DC therapy    Frequency     Barriers to discharge  none                     End of Session  Equipment Utilized During Treatment: Gait belt Activity Tolerance: Patient tolerated treatment well Patient left: in chair;with call bell/phone within reach;with chair alarm set      Functional Assessment Tool Used: clinical judgement Functional Limitation: Mobility: Walking and moving around Mobility: Walking and Moving Around Current Status (H5456): At least 1 percent but less than 20 percent impaired, limited or restricted Mobility: Walking and Moving Around Goal Status [phone]): At least 1 percent but less than 20 percent impaired, limited or restricted Mobility: Walking and Moving Around Discharge Status [phone]): At least 1 percent but less than 20 percent impaired, limited or restricted    Time: 2876-8115 PT Time Calculation (min) (ACUTE ONLY): 34 min   Charges:   PT Evaluation $Initial PT Evaluation Tier I: 1 Procedure     PT G Codes:   PT G-Codes **NOT FOR INPATIENT CLASS** Functional Assessment Tool Used: clinical judgement Functional Limitation: Mobility: Walking and moving around Mobility: Walking and Moving Around Current Status (B2620): At least 1 percent but less than 20 percent impaired, limited or restricted Mobility: Walking and Moving Around Goal Status [phone]): At least 1 percent but less than 20 percent impaired, limited or restricted Mobility: Walking and Moving Around Discharge Status [phone]): At least 1 percent but less than 20 percent impaired, limited or restricted    Sable Feil  PT 06/28/2015, 11:37 AM [phone]

## 2015-06-29 LAB — CK TOTAL AND CKMB (NOT AT ARMC)
CK TOTAL: 83 U/L (ref 38–234)
CK, MB: 2.7 ng/mL (ref 0.5–5.0)
RELATIVE INDEX: INVALID (ref 0.0–2.5)

## 2015-06-29 LAB — VITAMIN B12: VITAMIN B 12: 865 pg/mL (ref 180–914)

## 2015-06-29 LAB — ACETYLCHOLINE RECEPTOR, BINDING: ACETYLCHOLINE BINDING AB: 0.05 nmol/L (ref 0.00–0.24)

## 2015-06-29 MED ORDER — ESOMEPRAZOLE MAGNESIUM 40 MG PO PACK: 40.0000 mg | PACK | Freq: Every day | ORAL | Status: DC

## 2015-06-29 MED ORDER — ENSURE ENLIVE PO LIQD
237.0000 mL | Freq: Three times a day (TID) | ORAL | Status: DC
Start: 2015-06-29 — End: 2015-07-21

## 2015-06-29 NOTE — Progress Notes (Signed)
Subjective: She is awake and alert. She says she feels better but is still having significant troubles with swallowing. She had MRI done yesterday and her MRI does not show evidence of an acute event. Dr. Freddie Apley node is seen and appreciated  Objective: Vital signs in last 24 hours: Temp:  [98 F (36.7 C)-98.5 F (36.9 C)] 98.2 F (36.8 C) (08/16 0511) Pulse Rate:  [64-75] 75 (08/16 0511) Resp:  [20] 20 (08/16 0511) BP: (91-119)/(42-79) 119/79 mmHg (08/16 0511) SpO2:  [97 %-100 %] 98 % (08/16 0511) Weight:  [52.481 kg (115 lb 11.2 oz)] 52.481 kg (115 lb 11.2 oz) (08/16 0511) Weight change: 1.179 kg (2 lb 9.6 oz) Last BM Date: 06/26/15  Intake/Output from previous day: 08/15 0701 - 08/16 0700 In: 1356.3 [P.O.:600; I.V.:756.3] Out: 600 [Urine:600]  PHYSICAL EXAM General appearance: alert, cooperative, no distress and Very thin Resp: clear to auscultation bilaterally Cardio: regular rate and rhythm, S1, S2 normal, no murmur, click, rub or gallop GI: soft, non-tender; bowel sounds normal; no masses,  no organomegaly Extremities: extremities normal, atraumatic, no cyanosis or edema  Lab Results:  Results for orders placed or performed during the hospital encounter of 06/25/15 (from the past 48 hour(s))  Basic metabolic panel     Status: Abnormal   Collection Time: 06/28/15  5:34 AM  Result Value Ref Range   Sodium 145 135 - 145 mmol/L   Potassium 3.8 3.5 - 5.1 mmol/L   Chloride 116 (H) 101 - 111 mmol/L   CO2 25 22 - 32 mmol/L   Glucose, Bld 84 65 - 99 mg/dL   BUN 19 6 - 20 mg/dL   Creatinine, Ser 0.64 0.44 - 1.00 mg/dL   Calcium 7.8 (L) 8.9 - 10.3 mg/dL   GFR calc non Af Amer >60 >60 mL/min   GFR calc Af Amer >60 >60 mL/min    Comment: (NOTE) The eGFR has been calculated using the CKD EPI equation. This calculation has not been validated in all clinical situations. eGFR's persistently <60 mL/min signify possible Chronic Kidney Disease.    Anion gap 4 (L) 5 - 15     ABGS No results for input(s): PHART, PO2ART, TCO2, HCO3 in the last 72 hours.  Invalid input(s): PCO2 CULTURES Recent Results (from the past 240 hour(s))  Stool culture     Status: None (Preliminary result)   Collection Time: 06/26/15  1:25 AM  Result Value Ref Range Status   Specimen Description STOOL  Final   Special Requests Normal  Final   Culture   Final    Culture reincubated for better growth Performed at [facility]    Report Status PENDING  Incomplete  C difficile quick scan w PCR reflex     Status: None   Collection Time: 06/26/15  3:23 AM  Result Value Ref Range Status   C Diff antigen NEGATIVE NEGATIVE Final   C Diff toxin NEGATIVE NEGATIVE Final   C Diff interpretation Negative for toxigenic C. difficile  Final   Studies/Results: Mr Brain Wo Contrast  06/28/2015   CLINICAL DATA:  Vertigo for 3 days with weakness.  Kyphosis.  EXAM: MRI HEAD WITHOUT CONTRAST  TECHNIQUE: Multiplanar, multiecho pulse sequences of the brain and surrounding structures were obtained without intravenous contrast.  COMPARISON:  CT head 07/15/2008.  FINDINGS: No evidence for acute infarction, hemorrhage, mass lesion, hydrocephalus, or extra-axial fluid. Generalized cerebral and cerebellar atrophy. Moderately advanced T2 and FLAIR hyperintensities throughout the periventricular and subcortical white matter suggesting small  vessel disease. Prominent perivascular spaces likely secondary to hypertension. No foci of chronic hemorrhage. Flow voids are preserved. Pituitary, pineal, and cerebellar tonsils unremarkable. No upper cervical lesions. BILATERAL cataract extraction. No sinus air-fluid level. Negative mastoids. Extracranial soft tissues unremarkable.  IMPRESSION: Chronic changes as described. No acute intracranial findings are observed.   Electronically Signed   By: Staci Righter M.D.   On: 06/28/2015 18:35    Medications:  Prior to Admission:  Prescriptions prior to admission   Medication Sig Dispense Refill Last Dose  . gabapentin (NEURONTIN) 100 MG capsule Take 200 mg by mouth at bedtime.   11 06/24/2015 at Unknown time  . meclizine (ANTIVERT) 25 MG tablet Take 25 mg by mouth at bedtime.   06/24/2015 at Unknown time  . omeprazole (PRILOSEC) 20 MG capsule Take 1 capsule (20 mg total) by mouth daily. 60 capsule 2 06/24/2015 at Unknown time  . ondansetron (ZOFRAN ODT) 4 MG disintegrating tablet 87m ODT q4 hours prn nausea/vomit 12 tablet 0 06/25/2015 at Unknown time   Scheduled: . feeding supplement (ENSURE ENLIVE)  237 mL Oral BID BM  . gabapentin  200 mg Oral QHS  . meclizine  25 mg Oral QHS  . potassium chloride  20 mEq Oral Daily   Continuous: . sodium chloride Stopped (06/28/15 1622)   PKOE:[id number] ondansetron **OR** ondansetron (ZOFRAN) IV  Assesment: She has swallowing difficulty thought to be secondary to some sort of neuromuscular disease. Her MRI did not show a stroke. Her myasthenia panel that I ordered over the weekend is still pending. She has malnutrition because of her difficulty with swallowing. She has significant weakness. She was admitted with diarrhea but that has resolved Principal Problem:   Diarrhea Active Problems:   Dehydration   Weakness generalized   Vertigo   Malnutrition of moderate degree    Plan: Potential discharge. I want to discuss her situation with Dr. DMerlene Laughterand be sure that we don't need to order any further tests that need to be done in the hospital.      Doneta Bayman L 06/29/2015, 8:22 AM

## 2015-06-29 NOTE — Progress Notes (Signed)
Pt discharged home today per Dr. Juanetta Gosling.  Pt's IV site D/C'd and WDL.  Pt's VSS.  Pt provided with home medication list, discharge instructions and prescriptions.  Verbalized understanding.  Pt awaiting son's arrival for pickup at this time.

## 2015-06-29 NOTE — Care Management Note (Signed)
Case Management Note  Patient Details  Name: Nancy Horn MRN: [id number] Date of Birth: 1925-09-13  Expected Discharge Date:  06/29/15               Expected Discharge Plan:  Home w Home Health Services  In-House Referral:  NA  Discharge planning Services  CM Consult  Post Acute Care Choice:  Home Health, Durable Medical Equipment Choice offered to:  Patient  DME Arranged:  Walker rolling DME Agency:  [facility]  HH Arranged:  PT [facility] Agency:  [facility]  Status of Service:  Completed, signed off  Medicare Important Message Given:    Date Medicare IM Given:    Medicare IM give by:    Date Additional Medicare IM Given:    Additional Medicare Important Message give by:     If discussed at Long Length of Stay Meetings, dates discussed:    Additional Comments: Pt discharging home today with HH. [facility] made aware of DC date.  Malcolm Metro, RN 06/29/2015, 11:16 AM

## 2015-06-29 NOTE — Discharge Summary (Signed)
Physician Discharge Summary  Patient ID: Nancy Horn MRN: [id number] DOB/AGE: [birth year] 79 y.o. Primary Care Physician:Rayne Loiseau L, MD Admit date: 06/25/2015 Discharge date: 06/29/2015    Discharge Diagnoses:  Oral and esophageal phase dysphagia Principal Problem:   Diarrhea Active Problems:   Dehydration   Weakness generalized   Vertigo   Malnutrition of moderate degree     Medication List    STOP taking these medications        omeprazole 20 MG capsule  Commonly known as:  PRILOSEC      TAKE these medications        feeding supplement (ENSURE ENLIVE) Liqd  Take 237 mLs by mouth 3 (three) times daily between meals.     gabapentin 100 MG capsule  Commonly known as:  NEURONTIN  Take 200 mg by mouth at bedtime.     meclizine 25 MG tablet  Commonly known as:  ANTIVERT  Take 25 mg by mouth at bedtime.     ondansetron 4 MG disintegrating tablet  Commonly known as:  ZOFRAN ODT   ODT q4 hours prn nausea/vomit        Discharged Condition: Improved    Consults: Neurology  Significant Diagnostic Studies: Dg Chest 2 View  06/01/2015   CLINICAL DATA:  Fall with non painful knot near the left breast. Initial encounter.  EXAM: CHEST  2 VIEW  COMPARISON:  None.  FINDINGS: Chronic marked esophageal distention with gas, especially the thoracic inlet, displacing the trachea. The stomach is also gas dilated with fluid level.  Elevation of the left diaphragm, likely from gastric distention.  There is no edema, consolidation, effusion, or pneumothorax. Normal heart size. Negative aortic contours.  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  IMPRESSION: 1. No active cardiopulmonary disease. 2. Chronic marked distension of the esophagus, reference esophagram 04/13/2015.   Electronically Signed   By: Marnee Spring M.D.   On: 06/01/2015 10:44   Mr Brain Wo Contrast  06/28/2015    CLINICAL DATA:  Vertigo for 3 days with weakness.  Kyphosis.  EXAM: MRI HEAD WITHOUT CONTRAST  TECHNIQUE: Multiplanar, multiecho pulse sequences of the brain and surrounding structures were obtained without intravenous contrast.  COMPARISON:  CT head 07/15/2008.  FINDINGS: No evidence for acute infarction, hemorrhage, mass lesion, hydrocephalus, or extra-axial fluid. Generalized cerebral and cerebellar atrophy. Moderately advanced T2 and FLAIR hyperintensities throughout the periventricular and subcortical white matter suggesting small vessel disease. Prominent perivascular spaces likely secondary to hypertension. No foci of chronic hemorrhage. Flow voids are preserved. Pituitary, pineal, and cerebellar tonsils unremarkable. No upper cervical lesions. BILATERAL cataract extraction. No sinus air-fluid level. Negative mastoids. Extracranial soft tissues unremarkable.  IMPRESSION: Chronic changes as described. No acute intracranial findings are observed.   Electronically Signed   By: Elsie Stain M.D.   On: 06/28/2015 18:35    Lab Results: Basic Metabolic Panel:  Recent Labs  52/84/13 0534  NA 145  K 3.8  CL 116*  CO2 25  GLUCOSE 84  BUN 19  CREATININE 0.64  CALCIUM 7.8*   Liver Function Tests: No results for input(s): AST, ALT, ALKPHOS, BILITOT, PROT, ALBUMIN in the last 72 hours.   CBC: No results for input(s): WBC, NEUTROABS, HGB, HCT, MCV, PLT in the last 72 hours.  Recent Results (from the past 240 hour(s))  Stool culture     Status: None (Preliminary result)   Collection Time: 06/26/15  1:25 AM  Result Value  Ref Range Status   Specimen Description STOOL  Final   Special Requests Normal  Final   Culture   Final    Culture reincubated for better growth Performed at [facility]    Report Status PENDING  Incomplete  C difficile quick scan w PCR reflex     Status: None   Collection Time: 06/26/15  3:23 AM  Result Value Ref Range Status   C Diff antigen NEGATIVE NEGATIVE  Final   C Diff toxin NEGATIVE NEGATIVE Final   C Diff interpretation Negative for toxigenic C. difficile  Final     Hospital Course: This is an 81 year old who's had about a 5 month history of difficulty swallowing. She says she can swallow a little bit and then it feels like the food pools in the back of her throat. 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 Workup for neurological problems is underway. She came to the hospital because she had diarrhea for several days and was dehydrated. She was treated with IV fluids and improved. She had a number of laboratory tests that are still pending trying to see if she has something like myasthenia gravis. She had an MRI of the brain which did not show an acute abnormality. Neurology consult was done MRI was ordered and further testing will follow. She still has trouble swallowing but it is no worse than her baseline. She is no longer having any diarrhea.  Discharge Exam: Blood pressure 119/79, pulse 75, temperature 98.2 F (36.8 C), temperature source Oral, resp. rate 20, height 5\' 5"  (1.651 m), weight 52.481 kg (115 lb 11.2 oz), SpO2 98 %. She is awake and alert. She is very thin. Her chest is clear. She has a somewhat nasal tone to her speech.  Disposition: Home with home health services and follow-up with neurology      Signed: Zamauri Nez L   06/29/2015, 8:28 AM

## 2015-06-30 LAB — STOOL CULTURE

## 2015-06-30 LAB — HOMOCYSTEINE: Homocysteine: 6.3 umol/L (ref 0.0–15.0)

## 2015-07-13 ENCOUNTER — Emergency Department (HOSPITAL_COMMUNITY): Payer: Medicare Other

## 2015-07-13 ENCOUNTER — Inpatient Hospital Stay (HOSPITAL_COMMUNITY)
Admission: EM | Admit: 2015-07-13 | Discharge: 2015-07-21 | DRG: 391 | Disposition: A | Discharge status: Skilled Nursing Facility | Payer: Medicare Other | Attending: Pulmonary Disease | Admitting: Pulmonary Disease

## 2015-07-13 ENCOUNTER — Ambulatory Visit: Payer: Medicare Other | Admitting: Internal Medicine

## 2015-07-13 ENCOUNTER — Encounter (HOSPITAL_COMMUNITY): Payer: Self-pay | Admitting: *Deleted

## 2015-07-13 DIAGNOSIS — E876 Hypokalemia: Secondary | ICD-10-CM | POA: Diagnosis present

## 2015-07-13 DIAGNOSIS — R531 Weakness: Secondary | ICD-10-CM

## 2015-07-13 DIAGNOSIS — Z681 Body mass index (BMI) 19 or less, adult: Secondary | ICD-10-CM | POA: Diagnosis not present

## 2015-07-13 DIAGNOSIS — N390 Urinary tract infection, site not specified: Secondary | ICD-10-CM | POA: Diagnosis present

## 2015-07-13 DIAGNOSIS — E860 Dehydration: Secondary | ICD-10-CM | POA: Diagnosis present

## 2015-07-13 DIAGNOSIS — M810 Age-related osteoporosis without current pathological fracture: Secondary | ICD-10-CM | POA: Diagnosis present

## 2015-07-13 DIAGNOSIS — R197 Diarrhea, unspecified: Secondary | ICD-10-CM | POA: Diagnosis present

## 2015-07-13 DIAGNOSIS — E43 Unspecified severe protein-calorie malnutrition: Secondary | ICD-10-CM | POA: Diagnosis present

## 2015-07-13 DIAGNOSIS — R1314 Dysphagia, pharyngoesophageal phase: Principal | ICD-10-CM | POA: Diagnosis present

## 2015-07-13 DIAGNOSIS — E870 Hyperosmolality and hypernatremia: Secondary | ICD-10-CM | POA: Diagnosis present

## 2015-07-13 DIAGNOSIS — K2960 Other gastritis without bleeding: Secondary | ICD-10-CM | POA: Diagnosis present

## 2015-07-13 DIAGNOSIS — R1319 Other dysphagia: Secondary | ICD-10-CM | POA: Insufficient documentation

## 2015-07-13 DIAGNOSIS — G629 Polyneuropathy, unspecified: Secondary | ICD-10-CM | POA: Diagnosis present

## 2015-07-13 DIAGNOSIS — E46 Unspecified protein-calorie malnutrition: Secondary | ICD-10-CM | POA: Diagnosis not present

## 2015-07-13 DIAGNOSIS — I10 Essential (primary) hypertension: Secondary | ICD-10-CM | POA: Diagnosis present

## 2015-07-13 DIAGNOSIS — K8070 Calculus of gallbladder and bile duct without cholecystitis without obstruction: Secondary | ICD-10-CM | POA: Diagnosis present

## 2015-07-13 DIAGNOSIS — R1310 Dysphagia, unspecified: Secondary | ICD-10-CM | POA: Insufficient documentation

## 2015-07-13 DIAGNOSIS — T85598A Other mechanical complication of other gastrointestinal prosthetic devices, implants and grafts, initial encounter: Secondary | ICD-10-CM

## 2015-07-13 DIAGNOSIS — B9620 Unspecified Escherichia coli [E. coli] as the cause of diseases classified elsewhere: Secondary | ICD-10-CM | POA: Diagnosis present

## 2015-07-13 HISTORY — DX: Dysphagia, unspecified: R13.10

## 2015-07-13 LAB — URINALYSIS, ROUTINE W REFLEX MICROSCOPIC
Bilirubin Urine: NEGATIVE
GLUCOSE, UA: NEGATIVE mg/dL
HGB URINE DIPSTICK: NEGATIVE
Leukocytes, UA: NEGATIVE
Nitrite: POSITIVE — AB
PH: 5.5 (ref 5.0–8.0)
Protein, ur: NEGATIVE mg/dL
Urobilinogen, UA: 0.2 mg/dL (ref 0.0–1.0)

## 2015-07-13 LAB — CBC WITH DIFFERENTIAL/PLATELET
Basophils Absolute: 0 10*3/uL (ref 0.0–0.1)
Basophils Relative: 0 % (ref 0–1)
EOS ABS: 0 10*3/uL (ref 0.0–0.7)
Eosinophils Relative: 0 % (ref 0–5)
HEMATOCRIT: 40.4 % (ref 36.0–46.0)
HEMOGLOBIN: 13.3 g/dL (ref 12.0–15.0)
LYMPHS ABS: 0.6 10*3/uL — AB (ref 0.7–4.0)
Lymphocytes Relative: 23 % (ref 12–46)
MCH: 30.8 pg (ref 26.0–34.0)
MCHC: 32.9 g/dL (ref 30.0–36.0)
MCV: 93.5 fL (ref 78.0–100.0)
MONOS PCT: 18 % — AB (ref 3–12)
Monocytes Absolute: 0.4 10*3/uL (ref 0.1–1.0)
NEUTROS PCT: 59 % (ref 43–77)
Neutro Abs: 1.4 10*3/uL — ABNORMAL LOW (ref 1.7–7.7)
Platelets: 277 10*3/uL (ref 150–400)
RBC: 4.32 MIL/uL (ref 3.87–5.11)
RDW: 14.1 % (ref 11.5–15.5)
WBC: 2.4 10*3/uL — ABNORMAL LOW (ref 4.0–10.5)

## 2015-07-13 LAB — COMPREHENSIVE METABOLIC PANEL
ALT: 22 U/L (ref 14–54)
AST: 22 U/L (ref 15–41)
Albumin: 3.6 g/dL (ref 3.5–5.0)
Alkaline Phosphatase: 78 U/L (ref 38–126)
Anion gap: 8 (ref 5–15)
BILIRUBIN TOTAL: 0.7 mg/dL (ref 0.3–1.2)
BUN: 31 mg/dL — AB (ref 6–20)
CHLORIDE: 103 mmol/L (ref 101–111)
CO2: 27 mmol/L (ref 22–32)
CREATININE: 0.81 mg/dL (ref 0.44–1.00)
Calcium: 8.4 mg/dL — ABNORMAL LOW (ref 8.9–10.3)
GFR calc Af Amer: >60 mL/min (ref >60)
Glucose, Bld: 142 mg/dL — ABNORMAL HIGH (ref 65–99)
Potassium: 4.3 mmol/L (ref 3.5–5.1)
Sodium: 138 mmol/L (ref 135–145)
Total Protein: 6.8 g/dL (ref 6.5–8.1)

## 2015-07-13 LAB — LIPASE, BLOOD: LIPASE: 18 U/L — AB (ref 22–51)

## 2015-07-13 LAB — URINE MICROSCOPIC-ADD ON

## 2015-07-13 LAB — LACTIC ACID, PLASMA
Lactic Acid, Venous: 1.5 mmol/L (ref 0.5–2.0)
Lactic Acid, Venous: 1.7 mmol/L (ref 0.5–2.0)

## 2015-07-13 MED ORDER — IOHEXOL 300 MG/ML  SOLN
100.0000 mL | Freq: Once | INTRAMUSCULAR | Status: AC | PRN
  Administered 2015-07-13: 100 mL via INTRAVENOUS

## 2015-07-13 MED ORDER — MECLIZINE HCL 12.5 MG PO TABS
25.0000 mg | ORAL_TABLET | Freq: Every day | ORAL | Status: DC
  Administered 2015-07-13 – 2015-07-20 (×8): 25 mg via ORAL
  Filled 2015-07-13 (×8): qty 2

## 2015-07-13 MED ORDER — ACETAMINOPHEN 325 MG PO TABS
650.0000 mg | ORAL_TABLET | Freq: Four times a day (QID) | ORAL | Status: DC | PRN
  Administered 2015-07-20 – 2015-07-21 (×3): 650 mg via ORAL
  Filled 2015-07-13 (×3): qty 2

## 2015-07-13 MED ORDER — ACETAMINOPHEN 650 MG RE SUPP: 650.0000 mg | Freq: Four times a day (QID) | RECTAL | Status: DC | PRN

## 2015-07-13 MED ORDER — GABAPENTIN 100 MG PO CAPS
200.0000 mg | ORAL_CAPSULE | Freq: Every day | ORAL | Status: DC
  Administered 2015-07-13 – 2015-07-20 (×8): 200 mg via ORAL
  Filled 2015-07-13 (×8): qty 2

## 2015-07-13 MED ORDER — ENOXAPARIN SODIUM 40 MG/0.4ML ~~LOC~~ SOLN
40.0000 mg | SUBCUTANEOUS | Status: DC
Start: 2015-07-13 — End: 2015-07-17
  Administered 2015-07-13 – 2015-07-16 (×4): 40 mg via SUBCUTANEOUS
  Filled 2015-07-13 (×4): qty 0.4

## 2015-07-13 MED ORDER — ONDANSETRON HCL 4 MG PO TABS: 4.0000 mg | ORAL_TABLET | Freq: Four times a day (QID) | ORAL | Status: DC | PRN

## 2015-07-13 MED ORDER — ONDANSETRON HCL 4 MG/2ML IJ SOLN
4.0000 mg | INTRAMUSCULAR | Status: DC | PRN
  Administered 2015-07-13: 4 mg via INTRAVENOUS
  Filled 2015-07-13: qty 2

## 2015-07-13 MED ORDER — GABAPENTIN 300 MG PO CAPS
300.0000 mg | ORAL_CAPSULE | Freq: Every day | ORAL | Status: DC
  Filled 2015-07-13: qty 1

## 2015-07-13 MED ORDER — SODIUM CHLORIDE 0.9 % IV SOLN
INTRAVENOUS | Status: DC
  Administered 2015-07-13 – 2015-07-19 (×9): via INTRAVENOUS

## 2015-07-13 MED ORDER — IOHEXOL 300 MG/ML  SOLN
50.0000 mL | Freq: Once | INTRAMUSCULAR | Status: AC | PRN
  Administered 2015-07-13: 50 mL via ORAL

## 2015-07-13 MED ORDER — ONDANSETRON HCL 4 MG/2ML IJ SOLN
4.0000 mg | Freq: Four times a day (QID) | INTRAMUSCULAR | Status: DC | PRN
  Administered 2015-07-13: 4 mg via INTRAVENOUS
  Filled 2015-07-13 (×2): qty 2

## 2015-07-13 NOTE — ED Provider Notes (Signed)
CSN: [id number]     Arrival date & time 07/13/15  1520 History   First MD Initiated Contact with Patient 07/13/15 [phone]     Chief Complaint  Patient presents with  . Diarrhea      HPI  Pt was seen at 1610. Per pt's family, pt, and EMS report: c/o gradual onset and persistence of multiple intermittent episodes of diarrhea that began 3 days ago. Describes the stools as "watery." Has been associated with nausea and generalized weakness/fatigue. Pt has been unable to stand and ambulate for the past several days due to her weakness. Pt was discharged from the hospital on 06/29/15 for same, had negative cdiff test on 06/26/15. Pt states she was "feeling OK" and having "one stool/day" until 3 days ago when her symptoms began again. EMS states pt had an episode of diarrhea en route. Denies vomiting, no abd pain, no CP/SOB, no back pain, no fevers, no black or blood in stools.    Past Medical History  Diagnosis Date  . Osteoporosis   . Vertigo   . Neuropathy   . Candida esophagitis APR 2016  . Helicobacter pylori gastritis APR 2016    treated with amox and biaxin  . Dysphagia    Past Surgical History  Procedure Laterality Date  . Partial hysterectomy    . Esophagogastroduodenoscopy N/A 02/19/2015    Dr.Fields- dilated proximal esophagus, possible mid- esophageal web, mild non-erosive gastritis bx= chronic gastritis with focal activity and hpylori  . Esophageal dilation N/A 02/19/2015    Procedure: ESOPHAGEAL DILATION;  Surgeon: West Bali, MD;  Location: [facility];  Service: Endoscopy;  Laterality: N/A;   Family History  Problem Relation Age of Onset  . Colon cancer Neg Hx    Social History  Substance Use Topics  . Smoking status: Never Smoker   . Smokeless tobacco: None  . Alcohol Use: No    Review of Systems ROS: Statement: All systems negative except as marked or noted in the HPI; Constitutional: Negative for fever and chills. +generalized weakness/fatigue; ; Eyes: Negative for  eye pain, redness and discharge. ; ; ENMT: Negative for ear pain, hoarseness, nasal congestion, sinus pressure and sore throat. ; ; Cardiovascular: Negative for chest pain, palpitations, diaphoresis, dyspnea and peripheral edema. ; ; Respiratory: Negative for cough, wheezing and stridor. ; ; Gastrointestinal: +nausea, diarrhea. Negative for vomiting, abdominal pain, blood in stool, hematemesis, jaundice and rectal bleeding. . ; ; Genitourinary: Negative for dysuria, flank pain and hematuria. ; ; Musculoskeletal: Negative for back pain and neck pain. Negative for swelling and trauma.; ; Skin: Negative for pruritus, rash, abrasions, blisters, bruising and skin lesion.; ; Neuro: Negative for headache, lightheadedness and neck stiffness. Negative for altered level of consciousness , altered mental status, extremity weakness, paresthesias, involuntary movement, seizure and syncope.      Allergies  Review of patient's allergies indicates no known allergies.  Home Medications   Prior to Admission medications   Medication Sig Start Date End Date Taking? Authorizing Provider  esomeprazole (NEXIUM) 40 MG packet Take 40 mg by mouth daily before breakfast. 06/29/15   Kari Baars, MD  feeding supplement, ENSURE ENLIVE, (ENSURE ENLIVE) LIQD Take 237 mLs by mouth 3 (three) times daily between meals. 06/29/15   Kari Baars, MD  gabapentin (NEURONTIN) 100 MG capsule Take 200 mg by mouth at bedtime.  01/01/15   Historical Provider, MD  meclizine (ANTIVERT) 25 MG tablet Take 25 mg by mouth at bedtime.    Historical Provider, MD  ondansetron (ZOFRAN ODT) 4 MG disintegrating tablet 4mg  ODT q4 hours prn nausea/vomit 06/24/15   Bethann Berkshire, MD   BP 131/63 mmHg  Pulse 87  Temp(Src) 97.9 F (36.6 C) (Oral)  Resp 16  Wt 110 lb (49.896 kg)  SpO2 99%  16:29:08 Orthostatic Vital Signs MT  Orthostatic Lying  - BP- Lying: 122/55 mmHg ; Pulse- Lying: 91  Orthostatic Sitting - BP- Sitting: 126/64 mmHg ; Pulse- Sitting:  93  Orthostatic Standing at 0 minutes - BP- Standing at 0 minutes:  (pt unable to stand )     Physical Exam  1615: Physical examination:  Nursing notes reviewed; Vital signs and O2 SAT reviewed;  Constitutional: Well developed, Well nourished, In no acute distress; Head:  Normocephalic, atraumatic; Eyes: EOMI, PERRL, No scleral icterus; ENMT: Mouth and pharynx normal, Mucous membranes dry; Neck: Supple, Full range of motion, No lymphadenopathy; Cardiovascular: Regular rate and rhythm, No gallop; Respiratory: Breath sounds clear & equal bilaterally, No wheezes.  Speaking full sentences with ease, Normal respiratory effort/excursion; Chest: Nontender, Movement normal; Abdomen: Soft, +mild diffuse tenderness to palp, +distended, +tympanitic, +increased bowel sounds; Genitourinary: No CVA tenderness; Extremities: Pulses normal, No tenderness, No edema, No calf edema or asymmetry.; Neuro: AA&Ox3, Major CN grossly intact.  Speech clear. No gross focal motor or sensory deficits in extremities.; Skin: Color normal, Warm, Dry.   ED Course  Procedures (including critical care time) Labs Review   Imaging Review  I have personally reviewed and evaluated these images and lab results as part of my medical decision-making.   EKG Interpretation None      MDM  MDM Reviewed: previous chart, nursing note and vitals Reviewed previous: labs Interpretation: labs, x-ray and CT scan      Results for orders placed or performed during the hospital encounter of 07/13/15  Comprehensive metabolic panel  Result Value Ref Range   Sodium 138 135 - 145 mmol/L   Potassium 4.3 3.5 - 5.1 mmol/L   Chloride 103 101 - 111 mmol/L   CO2 27 22 - 32 mmol/L   Glucose, Bld 142 (H) 65 - 99 mg/dL   BUN 31 (H) 6 - 20 mg/dL   Creatinine, Ser 4.09 0.44 - 1.00 mg/dL   Calcium 8.4 (L) 8.9 - 10.3 mg/dL   Total Protein 6.8 6.5 - 8.1 g/dL   Albumin 3.6 3.5 - 5.0 g/dL   AST 22 15 - 41 U/L   ALT 22 14 - 54 U/L   Alkaline  Phosphatase 78 38 - 126 U/L   Total Bilirubin 0.7 0.3 - 1.2 mg/dL   GFR calc non Af Amer >60 >60 mL/min   GFR calc Af Amer >60 >60 mL/min   Anion gap 8 5 - 15  Lipase, blood  Result Value Ref Range   Lipase 18 (L) 22 - 51 U/L  Lactic acid, plasma  Result Value Ref Range   Lactic Acid, Venous 1.5 0.5 - 2.0 mmol/L  CBC with Differential  Result Value Ref Range   WBC 2.4 (L) 4.0 - 10.5 K/uL   RBC 4.32 3.87 - 5.11 MIL/uL   Hemoglobin 13.3 12.0 - 15.0 g/dL   HCT 81.1 91.4 - 78.2 %   MCV 93.5 78.0 - 100.0 fL   MCH 30.8 26.0 - 34.0 pg   MCHC 32.9 30.0 - 36.0 g/dL   RDW 95.6 21.3 - 08.6 %   Platelets 277 150 - 400 K/uL   Neutrophils Relative % 59 43 - 77 %   Neutro  Abs 1.4 (L) 1.7 - 7.7 K/uL   Lymphocytes Relative 23 12 - 46 %   Lymphs Abs 0.6 (L) 0.7 - 4.0 K/uL   Monocytes Relative 18 (H) 3 - 12 %   Monocytes Absolute 0.4 0.1 - 1.0 K/uL   Eosinophils Relative 0 0 - 5 %   Eosinophils Absolute 0.0 0.0 - 0.7 K/uL   Basophils Relative 0 0 - 1 %   Basophils Absolute 0.0 0.0 - 0.1 K/uL  Urinalysis, Routine w reflex microscopic (not at [facility])  Result Value Ref Range   Color, Urine YELLOW YELLOW   APPearance CLOUDY (A) CLEAR   Specific Gravity, Urine >1.030 (H) 1.005 - 1.030   pH 5.5 5.0 - 8.0   Glucose, UA NEGATIVE NEGATIVE mg/dL   Hgb urine dipstick NEGATIVE NEGATIVE   Bilirubin Urine NEGATIVE NEGATIVE   Ketones, ur TRACE (A) NEGATIVE mg/dL   Protein, ur NEGATIVE NEGATIVE mg/dL   Urobilinogen, UA 0.2 0.0 - 1.0 mg/dL   Nitrite POSITIVE (A) NEGATIVE   Leukocytes, UA NEGATIVE NEGATIVE  Urine microscopic-add on  Result Value Ref Range   Squamous Epithelial / LPF RARE RARE   WBC, UA 3-6 <3 WBC/hpf   Bacteria, UA MANY (A) RARE    Ct Abdomen Pelvis W Contrast 07/13/2015   CLINICAL DATA:  Diarrhea.  EXAM: CT ABDOMEN AND PELVIS WITH CONTRAST  TECHNIQUE: Multidetector CT imaging of the abdomen and pelvis was performed using the standard protocol following bolus administration of  intravenous contrast.  CONTRAST:  50mL OMNIPAQUE IOHEXOL 300 MG/ML SOLN, OMNIPAQUE IOHEXOL 300 MG/ML SOLN  COMPARISON:  None.  FINDINGS: Mild cardiomegaly. Scarring noted in the lung bases. No pleural effusions.  Gallbladder is distended. Small layering stones noted. There is mild intrahepatic and extrahepatic biliary ductal dilatation. Concern for multiple stones within the common bile duct. This is best seen on coronal image 36. No focal hepatic abnormality. Spleen, pancreas, kidneys are unremarkable except for small benign appearing renal cysts bilaterally. No hydronephrosis.  Nonobstructive bowel gas pattern. Scattered air-fluid levels throughout the large and small bowel. This could reflect gastroenteritis. No free fluid or free air. No adenopathy. Aorta is normal caliber.  Prior hysterectomy. No adnexal masses. Urinary bladder is unremarkable.  No acute bony abnormality or focal bone lesion.  IMPRESSION: Fluid throughout the large and small bowel with scattered air-fluid levels. No evidence of bowel obstruction. Findings could reflect gastroenteritis.  Gallbladder distention with small layering stones. Similar density structures are noted in the dilated common bile duct concerning for common bile duct stones. Intrahepatic and extrahepatic biliary ductal dilatation. If further evaluation is felt warranted, MRCP or ERCP may be beneficial.  Bilateral renal cysts.  Bibasilar scarring.   Electronically Signed   By: Charlett Nose M.D.   On: 07/13/2015 18:50    1910:  Pt unable to stand for orthostatic VS due to generalized weakness. Pt has not stooled while in the ED: cdiff and GI pathogen panel pending. UC is pending. Dx and testing d/w pt and family.  Questions answered.  Verb understanding, agreeable to admit.   T/C to [facility] Dr. Sharl Ma, case discussed, including:  HPI, pertinent PM/SHx, VS/PE, dx testing, ED course and treatment:  Agreeable to admit, requests to write temporary orders, obtain medical bed to  Dr. Juanetta Gosling' service.   Samuel Jester, DO 07/18/15 [phone]

## 2015-07-13 NOTE — H&P (Signed)
PCP:   Fredirick Maudlin, MD   Chief Complaint:  Diarrhea  HPI:  81 year old female who  has a past medical history of Osteoporosis; Vertigo; Neuropathy; Candida esophagitis (APR 2016); Helicobacter pylori gastritis (APR 2016); and Dysphagia. Today presents to the hospital with chief complaint of diarrhea. Patient has a history of oral and esophageal phase dysphagia, was recently discharged from the hospital on 06/29/2015. As per patient she started having diarrhea for past 3 days. She describes the stool as watery, also patient has been having difficulty with eating. As soon as she eats something she has a loose bowel movement. She denies having fever, no blood in the stool. No vomiting No abdominal pain, but she has abdominal distention No chest pain or shortness of breath. No dysuria urgency frequency of urination C. Difficile PCR has been obtained in the ED  Allergies:  No Known Allergies    Past Medical History  Diagnosis Date  . Osteoporosis   . Vertigo   . Neuropathy   . Candida esophagitis APR 2016  . Helicobacter pylori gastritis APR 2016    treated with amox and biaxin  . Dysphagia     Past Surgical History  Procedure Laterality Date  . Partial hysterectomy    . Esophagogastroduodenoscopy N/A 02/19/2015    Dr.Fields- dilated proximal esophagus, possible mid- esophageal web, mild non-erosive gastritis bx= chronic gastritis with focal activity and hpylori  . Esophageal dilation N/A 02/19/2015    Procedure: ESOPHAGEAL DILATION;  Surgeon: West Bali, MD;  Location: [facility];  Service: Endoscopy;  Laterality: N/A;    Prior to Admission medications   Medication Sig Start Date End Date Taking? Authorizing Provider  gabapentin (NEURONTIN) 100 MG capsule Take 300 mg by mouth at bedtime.  01/01/15  Yes Historical Provider, MD  loperamide (IMODIUM) 1 MG/5ML solution Take 2 mg by mouth as needed for diarrhea or loose stools.   Yes Historical Provider, MD  meclizine  (ANTIVERT) 25 MG tablet Take 25 mg by mouth at bedtime.   Yes Historical Provider, MD  ondansetron (ZOFRAN ODT) 4 MG disintegrating tablet  ODT q4 hours prn nausea/vomit Patient taking differently: Take 4 mg by mouth every 4 (four) hours as needed for nausea or vomiting.  ODT q4 hours prn nausea/vomit 06/24/15  Yes Bethann Berkshire, MD  esomeprazole (NEXIUM) 40 MG packet Take 40 mg by mouth daily before breakfast. 06/29/15   Kari Baars, MD  feeding supplement, ENSURE ENLIVE, (ENSURE ENLIVE) LIQD Take 237 mLs by mouth 3 (three) times daily between meals. 06/29/15   Kari Baars, MD    Social History:  reports that she has never smoked. She does not have any smokeless tobacco history on file. She reports that she does not drink alcohol or use illicit drugs.  Family History  Problem Relation Age of Onset  . Colon cancer Neg Hx     Filed Weights   07/13/15 1521  Weight: 49.896 kg (110 lb)    All the positives are listed in BOLD  Review of Systems:  HEENT: Headache, blurred vision, runny nose, sore throat Neck: Hypothyroidism, hyperthyroidism,,lymphadenopathy Chest : Shortness of breath, history of COPD, Asthma Heart : Chest pain, history of coronary arterey disease GI:  Nausea, vomiting, diarrhea, constipation, GERD GU: Dysuria, urgency, frequency of urination, hematuria Neuro: Stroke, seizures, syncope Psych: Depression, anxiety, hallucinations   Physical Exam: Blood pressure 120/58, pulse 95, temperature 97.9 F (36.6 C), temperature source Oral, resp. rate 16, weight 49.896 kg (110 lb), SpO2 94 %.  Constitutional:   Patient is a well-developed and well-nourished female* in no acute distress and cooperative with exam. Head: Normocephalic and atraumatic Mouth: Mucus membranes moist Eyes: PERRL, EOMI, conjunctivae normal Neck: Supple, No Thyromegaly Cardiovascular: RRR, S1 normal, S2 normal Pulmonary/Chest: CTAB, no wheezes, rales, or rhonchi Abdominal: Soft. Non-tender,  distended, bowel sounds are normal, no masses, organomegaly, or guarding present.  Neurological: A&O x3, Strength is normal and symmetric bilaterally, cranial nerve II-XII are grossly intact, no focal motor deficit, sensory intact to light touch bilaterally.  Extremities : No Cyanosis, Clubbing or Edema  Labs on Admission:  Basic Metabolic Panel:  Recent Labs Lab 07/13/15 1618  NA 138  K 4.3  CL 103  CO2 27  GLUCOSE 142*  BUN 31*  CREATININE 0.81  CALCIUM 8.4*   Liver Function Tests:  Recent Labs Lab 07/13/15 1618  AST 22  ALT 22  ALKPHOS 78  BILITOT 0.7  PROT 6.8  ALBUMIN 3.6    Recent Labs Lab 07/13/15 1618  LIPASE 18*   CBC:  Recent Labs Lab 07/13/15 1618  WBC 2.4*  NEUTROABS 1.4*  HGB 13.3  HCT 40.4  MCV 93.5  PLT 277    Radiological Exams on Admission: Dg Chest 2 View  07/13/2015   CLINICAL DATA:  Constant diarrhea and nausea for 2 days. History of gastritis, esophagitis, dysphagia and esophageal dilatation. Initial encounter.  EXAM: CHEST  2 VIEW  COMPARISON:  06/01/2015.  FINDINGS: The heart size and mediastinal contours are stable. There is marked gaseous distention of the esophagus, especially at the thoracic inlet. Appearance is unchanged. Left hemidiaphragm elevation and chronic changes at the left base are stable. The lungs are otherwise clear. There is no pleural effusion. Thoracic spine degenerative changes and segmentation anomaly are unchanged.  IMPRESSION: No active cardiopulmonary process. Stable marked esophageal dilatation, likely achalasia.   Electronically Signed   By: Carey Bullocks M.D.   On: 07/13/2015 17:00   Ct Abdomen Pelvis W Contrast  07/13/2015   CLINICAL DATA:  Diarrhea.  EXAM: CT ABDOMEN AND PELVIS WITH CONTRAST  TECHNIQUE: Multidetector CT imaging of the abdomen and pelvis was performed using the standard protocol following bolus administration of intravenous contrast.  CONTRAST:  50mL OMNIPAQUE IOHEXOL 300 MG/ML SOLN,  OMNIPAQUE IOHEXOL 300 MG/ML SOLN  COMPARISON:  None.  FINDINGS: Mild cardiomegaly. Scarring noted in the lung bases. No pleural effusions.  Gallbladder is distended. Small layering stones noted. There is mild intrahepatic and extrahepatic biliary ductal dilatation. Concern for multiple stones within the common bile duct. This is best seen on coronal image 36. No focal hepatic abnormality. Spleen, pancreas, kidneys are unremarkable except for small benign appearing renal cysts bilaterally. No hydronephrosis.  Nonobstructive bowel gas pattern. Scattered air-fluid levels throughout the large and small bowel. This could reflect gastroenteritis. No free fluid or free air. No adenopathy. Aorta is normal caliber.  Prior hysterectomy. No adnexal masses. Urinary bladder is unremarkable.  No acute bony abnormality or focal bone lesion.  IMPRESSION: Fluid throughout the large and small bowel with scattered air-fluid levels. No evidence of bowel obstruction. Findings could reflect gastroenteritis.  Gallbladder distention with small layering stones. Similar density structures are noted in the dilated common bile duct concerning for common bile duct stones. Intrahepatic and extrahepatic biliary ductal dilatation. If further evaluation is felt warranted, MRCP or ERCP may be beneficial.  Bilateral renal cysts.  Bibasilar scarring.   Electronically Signed   By: Charlett Nose M.D.   On: 07/13/2015 18:50  Assessment/Plan Active Problems:   Dysphagia, pharyngoesophageal phase   Diarrhea  Diarrhea CT abdomen pelvis shows possible gastroenteritis also shows gallbladder distention with small layering stones. We'll keep the patient nothing by mouth, check stool for C. Difficile PCR. Will consult GI in a.m. IV fluids, antiemetics  Dysphagia Patient has  Pharyngoesophageal phase dysphagia, previous workup did not reveal the cause of it. Keep patient nothing by mouth Further recommendations as per GI  Peripheral  neuropathy Continue gabapentin    Code status: Full code  Family discussion: Admission, patients condition and plan of care including tests being ordered have been discussed with the patient and *Her son at bedside* who indicate understanding and agree with the plan and Code Status.   Time Spent on Admission: 60 min  Bernard Donahoo S [facility] Pager: [phone] 07/13/2015, 7:38 PM  If 7PM-7AM, please contact night-coverage  [email or web address]  Password [email or web address]

## 2015-07-13 NOTE — ED Notes (Signed)
Pt comes in from home via EMS for diarrhea. Pt was recently hospitalized for the same. Pt was sent home and diarrhea started on Saturday. Pt states her last episode of diarrhea was at 0100. Pt had episode on EMS ride to hospital. Pt states she has increasing nausea, denies pain at this time. Pt feels generally weak.

## 2015-07-13 NOTE — ED Notes (Signed)
In and out catheter performed with Sidney Ace, NT.   Pt also cleaned up from having bowel movement in bed. Linens changed.

## 2015-07-13 NOTE — ED Notes (Signed)
Pt taken to XR.  

## 2015-07-13 NOTE — ED Notes (Signed)
Pt unable to drink more than a few ounces of contrast due to esophageal issues, dr. Clarene Duke notified and CT called.  Dr. Clarene Duke reports that it is okay that pt goes without anymore contrast.  Ct notified nurse that pt was not due to go until 1830

## 2015-07-14 ENCOUNTER — Encounter (HOSPITAL_COMMUNITY): Payer: Self-pay | Admitting: *Deleted

## 2015-07-14 DIAGNOSIS — R197 Diarrhea, unspecified: Secondary | ICD-10-CM

## 2015-07-14 LAB — COMPREHENSIVE METABOLIC PANEL
ALK PHOS: 63 U/L (ref 38–126)
ALT: 17 U/L (ref 14–54)
ANION GAP: 6 (ref 5–15)
AST: 14 U/L — ABNORMAL LOW (ref 15–41)
Albumin: 3 g/dL — ABNORMAL LOW (ref 3.5–5.0)
BILIRUBIN TOTAL: 0.7 mg/dL (ref 0.3–1.2)
BUN: 24 mg/dL — ABNORMAL HIGH (ref 6–20)
CALCIUM: 7.4 mg/dL — AB (ref 8.9–10.3)
CO2: 25 mmol/L (ref 22–32)
CREATININE: 0.74 mg/dL (ref 0.44–1.00)
Chloride: 109 mmol/L (ref 101–111)
GFR calc non Af Amer: >60 mL/min (ref >60)
Glucose, Bld: 113 mg/dL — ABNORMAL HIGH (ref 65–99)
Potassium: 3.8 mmol/L (ref 3.5–5.1)
Sodium: 140 mmol/L (ref 135–145)
TOTAL PROTEIN: 5.8 g/dL — AB (ref 6.5–8.1)

## 2015-07-14 LAB — C DIFFICILE QUICK SCREEN W PCR REFLEX
C DIFFICILE (CDIFF) INTERP: NEGATIVE
C Diff antigen: NEGATIVE
C Diff toxin: NEGATIVE

## 2015-07-14 LAB — CBC
HEMATOCRIT: 37.2 % (ref 36.0–46.0)
HEMOGLOBIN: 12.1 g/dL (ref 12.0–15.0)
MCH: 30.6 pg (ref 26.0–34.0)
MCHC: 32.5 g/dL (ref 30.0–36.0)
MCV: 94.2 fL (ref 78.0–100.0)
Platelets: 243 10*3/uL (ref 150–400)
RBC: 3.95 MIL/uL (ref 3.87–5.11)
RDW: 14.4 % (ref 11.5–15.5)
WBC: 2.4 10*3/uL — ABNORMAL LOW (ref 4.0–10.5)

## 2015-07-14 MED ORDER — LOPERAMIDE HCL 2 MG PO CAPS
2.0000 mg | ORAL_CAPSULE | Freq: Four times a day (QID) | ORAL | Status: AC
  Administered 2015-07-14 – 2015-07-15 (×3): 2 mg via ORAL
  Filled 2015-07-14 (×2): qty 1

## 2015-07-14 MED ORDER — CETYLPYRIDINIUM CHLORIDE 0.05 % MT LIQD
7.0000 mL | Freq: Two times a day (BID) | OROMUCOSAL | Status: DC
  Administered 2015-07-14 – 2015-07-21 (×13): 7 mL via OROMUCOSAL

## 2015-07-14 MED ORDER — PROMETHAZINE HCL 12.5 MG PO TABS
12.5000 mg | ORAL_TABLET | Freq: Four times a day (QID) | ORAL | Status: DC | PRN
  Administered 2015-07-14 – 2015-07-17 (×6): 12.5 mg via ORAL
  Filled 2015-07-14 (×6): qty 1

## 2015-07-14 NOTE — Consult Note (Signed)
Reason for [place] \Referring Physician: Hospitalist  Nancy Horn is an 81 y.o. female.  HPI: Admitted thru the ED Tuesday with c/o diarrhea. Diarrhea started Sunday night. She says she was having multiple stools. She says she did have a fever. States this is the second epioside of diarrhea in the past two weeks.She was admitted 06/26/2015 for diarrhea. No chills. She has nausea. There has been no vomiting. She denies any abdominal pain.  No recent antibiotics.  Stool studies are pending.  She has never undergone a colonoscopy in the past.  Hx of dysphagia and has been evaluated at [facility] in the past. She is on a pureed diet at home.   Past Medical History  Diagnosis Date  . Osteoporosis   . Vertigo   . Neuropathy   . Candida esophagitis APR 2016  . Helicobacter pylori gastritis APR 2016    treated with amox and biaxin  . Dysphagia     Past Surgical History  Procedure Laterality Date  . Partial hysterectomy    . Esophagogastroduodenoscopy N/A 02/19/2015    Dr.Fields- dilated proximal esophagus, possible mid- esophageal web, mild non-erosive gastritis bx= chronic gastritis with focal activity and hpylori  . Esophageal dilation N/A 02/19/2015    Procedure: ESOPHAGEAL DILATION;  Surgeon: Danie Binder, MD;  Location: [facility];  Service: Endoscopy;  Laterality: N/A;    Family History  Problem Relation Age of Onset  . Colon cancer Neg Hx     Social History:  reports that she has never smoked. She does not have any smokeless tobacco history on file. She reports that she does not drink alcohol or use illicit drugs.  Allergies: No Known Allergies  Medications: I have reviewed the patient's current medications.  Results for orders placed or performed during the hospital encounter of 07/13/15 (from the past 48 hour(s))  Comprehensive metabolic panel     Status: Abnormal   Collection Time: 07/13/15  4:18 PM  Result Value Ref Range   Sodium 138 135 - 145 mmol/L   Potassium 4.3 3.5 - 5.1 mmol/L   Chloride 103 101 - 111 mmol/L   CO2 27 22 - 32 mmol/L   Glucose, Bld 142 (H) 65 - 99 mg/dL   BUN 31 (H) 6 - 20 mg/dL   Creatinine, Ser 0.81 0.44 - 1.00 mg/dL   Calcium 8.4 (L) 8.9 - 10.3 mg/dL   Total Protein 6.8 6.5 - 8.1 g/dL   Albumin 3.6 3.5 - 5.0 g/dL   AST 22 15 - 41 U/L   ALT 22 14 - 54 U/L   Alkaline Phosphatase 78 38 - 126 U/L   Total Bilirubin 0.7 0.3 - 1.2 mg/dL   GFR calc non Af Amer >60 >60 mL/min   GFR calc Af Amer >60 >60 mL/min    Comment: (NOTE) The eGFR has been calculated using the CKD EPI equation. This calculation has not been validated in all clinical situations. eGFR's persistently <60 mL/min signify possible Chronic Kidney Disease.    Anion gap 8 5 - 15  Lipase, blood     Status: Abnormal   Collection Time: 07/13/15  4:18 PM  Result Value Ref Range   Lipase 18 (L) 22 - 51 U/L  Lactic acid, plasma     Status: None   Collection Time: 07/13/15  4:18 PM  Result Value Ref Range   Lactic Acid, Venous 1.5 0.5 - 2.0 mmol/L  CBC with Differential     Status: Abnormal   Collection Time:  07/13/15  4:18 PM  Result Value Ref Range   WBC 2.4 (L) 4.0 - 10.5 K/uL   RBC 4.32 3.87 - 5.11 MIL/uL   Hemoglobin 13.3 12.0 - 15.0 g/dL   HCT 40.4 36.0 - 46.0 %   MCV 93.5 78.0 - 100.0 fL   MCH 30.8 26.0 - 34.0 pg   MCHC 32.9 30.0 - 36.0 g/dL   RDW 14.1 11.5 - 15.5 %   Platelets 277 150 - 400 K/uL   Neutrophils Relative % 59 43 - 77 %   Neutro Abs 1.4 (L) 1.7 - 7.7 K/uL   Lymphocytes Relative 23 12 - 46 %   Lymphs Abs 0.6 (L) 0.7 - 4.0 K/uL   Monocytes Relative 18 (H) 3 - 12 %   Monocytes Absolute 0.4 0.1 - 1.0 K/uL   Eosinophils Relative 0 0 - 5 %   Eosinophils Absolute 0.0 0.0 - 0.7 K/uL   Basophils Relative 0 0 - 1 %   Basophils Absolute 0.0 0.0 - 0.1 K/uL  Urinalysis, Routine w reflex microscopic (not at [facility])     Status: Abnormal   Collection Time: 07/13/15  6:50 PM  Result Value Ref Range   Color, Urine YELLOW YELLOW    APPearance CLOUDY (A) CLEAR   Specific Gravity, Urine >1.030 (H) 1.005 - 1.030   pH 5.5 5.0 - 8.0   Glucose, UA NEGATIVE NEGATIVE mg/dL   Hgb urine dipstick NEGATIVE NEGATIVE   Bilirubin Urine NEGATIVE NEGATIVE   Ketones, ur TRACE (A) NEGATIVE mg/dL   Protein, ur NEGATIVE NEGATIVE mg/dL   Urobilinogen, UA 0.2 0.0 - 1.0 mg/dL   Nitrite POSITIVE (A) NEGATIVE   Leukocytes, UA NEGATIVE NEGATIVE  Urine microscopic-add on     Status: Abnormal   Collection Time: 07/13/15  6:50 PM  Result Value Ref Range   Squamous Epithelial / LPF RARE RARE   WBC, UA 3-6 <3 WBC/hpf   Bacteria, UA MANY (A) RARE  Lactic acid, plasma     Status: None   Collection Time: 07/13/15  7:09 PM  Result Value Ref Range   Lactic Acid, Venous 1.7 0.5 - 2.0 mmol/L  CBC     Status: Abnormal   Collection Time: 07/14/15  6:08 AM  Result Value Ref Range   WBC 2.4 (L) 4.0 - 10.5 K/uL   RBC 3.95 3.87 - 5.11 MIL/uL   Hemoglobin 12.1 12.0 - 15.0 g/dL   HCT 37.2 36.0 - 46.0 %   MCV 94.2 78.0 - 100.0 fL   MCH 30.6 26.0 - 34.0 pg   MCHC 32.5 30.0 - 36.0 g/dL   RDW 14.4 11.5 - 15.5 %   Platelets 243 150 - 400 K/uL  Comprehensive metabolic panel     Status: Abnormal   Collection Time: 07/14/15  6:08 AM  Result Value Ref Range   Sodium 140 135 - 145 mmol/L   Potassium 3.8 3.5 - 5.1 mmol/L   Chloride 109 101 - 111 mmol/L   CO2 25 22 - 32 mmol/L   Glucose, Bld 113 (H) 65 - 99 mg/dL   BUN 24 (H) 6 - 20 mg/dL   Creatinine, Ser 0.74 0.44 - 1.00 mg/dL   Calcium 7.4 (L) 8.9 - 10.3 mg/dL   Total Protein 5.8 (L) 6.5 - 8.1 g/dL   Albumin 3.0 (L) 3.5 - 5.0 g/dL   AST 14 (L) 15 - 41 U/L   ALT 17 14 - 54 U/L   Alkaline Phosphatase 63 38 - 126 U/L  Total Bilirubin 0.7 0.3 - 1.2 mg/dL   GFR calc non Af Amer >60 >60 mL/min   GFR calc Af Amer >60 >60 mL/min    Comment: (NOTE) The eGFR has been calculated using the CKD EPI equation. This calculation has not been validated in all clinical situations. eGFR's persistently <60 mL/min  signify possible Chronic Kidney Disease.    Anion gap 6 5 - 15      ROS Blood pressure 111/48, pulse 79, temperature 97.8 F (36.6 C), temperature source Oral, resp. rate 20, weight 107 lb 5.8 oz (48.7 kg), SpO2 96 %. Physical Exam Alert and oriented. Frail. Skin warm and dry. Oral mucosa is moist.   . Sclera anicteric, conjunctivae is pink. Thyroid not enlarged. No cervical lymphadenopathy. Lungs clear. Heart regular rate and rhythm.  Abdomen is soft. Bowel sounds are positive. No hepatomegaly. No abdominal masses felt. No tenderness.  No edema to lower extremities.     Assessment/Plan: Possible gastroenteritis. Stool studies are pending.  Possible CBD stones on CT. Will discuss with Dr Laural Golden. Further recommendations to follow.   SETZER,TERRI W 07/14/2015, 11:56 AM   GI attending note; Patient interviewed and examined; records reviewed. Patient has falling GI issues:  #1: Nonbloody diarrhea of recent onset. No prior history of anti-biotic use. CT does not reveal wall thickening to small or large bowel. Air-fluid levels noted in colon. Stool studies have not been obtained yet. Foley's catheter was passed into rectum and stool sample obtained for studies. Stool was greenish liquid stool. While waiting for stool studies will treat her symptomatically. She could have small intestinal bacterial overgrowth given she suspected to have GI motility disorder. #2. Dysphagia. Patient has been thoroughly evaluated and no recommendations at the present time other than continuing pureed diet when diet advanced. #3. Patient has cholelithiasis as well as choledocholithiasis but she does not have biliary symptoms and transaminases are normal. She is at risk for cholangitis as well as biliary pancreatitis and therefore ERCP should be considered when diarrhea evaluation complete.  Recommendations: Stool sample obtained by placing Foley's catheter in the rectum for stool studies. Imodium 2 mg by mouth now  and two more doses.

## 2015-07-14 NOTE — Progress Notes (Signed)
Patient had several loose stools during the night.  C-diff specimen was unable to be obtained because patient too weak to go to bedside commode.  Next shift notified.  Will monitor patient.

## 2015-07-14 NOTE — Progress Notes (Signed)
Subjective: She was admitted with dehydration again. She started having diarrhea. She was in the hospital previously with a similar problem and that stopped but now she's having difficulty again. She still has significant issues with swallowing  Objective: Vital signs in last 24 hours: Temp:  [97.9 F (36.6 C)-98.8 F (37.1 C)] 98 F (36.7 C) (08/31 0500) Pulse Rate:  [87-101] 97 (08/31 0500) Resp:  [16-20] 20 (08/31 0500) BP: (111-140)/(55-87) 111/62 mmHg (08/31 0500) SpO2:  [93 %-99 %] 96 % (08/31 0500) Weight:  [48.7 kg (107 lb 5.8 oz)-49.896 kg (110 lb)] 48.7 kg (107 lb 5.8 oz) (08/30 2039) Weight change:  Last BM Date: 07/13/15  Intake/Output from previous day:    PHYSICAL EXAM General appearance: alert, cooperative and mild distress Resp: clear to auscultation bilaterally Cardio: regular rate and rhythm, S1, S2 normal, no murmur, click, rub or gallop GI: soft, non-tender; bowel sounds normal; no masses,  no organomegaly Extremities: extremities normal, atraumatic, no cyanosis or edema  Lab Results:  Results for orders placed or performed during the hospital encounter of 07/13/15 (from the past 48 hour(s))  Comprehensive metabolic panel     Status: Abnormal   Collection Time: 07/13/15  4:18 PM  Result Value Ref Range   Sodium 138 135 - 145 mmol/L   Potassium 4.3 3.5 - 5.1 mmol/L   Chloride 103 101 - 111 mmol/L   CO2 27 22 - 32 mmol/L   Glucose, Bld 142 (H) 65 - 99 mg/dL   BUN 31 (H) 6 - 20 mg/dL   Creatinine, Ser 0.81 0.44 - 1.00 mg/dL   Calcium 8.4 (L) 8.9 - 10.3 mg/dL   Total Protein 6.8 6.5 - 8.1 g/dL   Albumin 3.6 3.5 - 5.0 g/dL   AST 22 15 - 41 U/L   ALT 22 14 - 54 U/L   Alkaline Phosphatase 78 38 - 126 U/L   Total Bilirubin 0.7 0.3 - 1.2 mg/dL   GFR calc non Af Amer >60 >60 mL/min   GFR calc Af Amer >60 >60 mL/min    Comment: (NOTE) The eGFR has been calculated using the CKD EPI equation. This calculation has not been validated in all clinical  situations. eGFR's persistently <60 mL/min signify possible Chronic Kidney Disease.    Anion gap 8 5 - 15  Lipase, blood     Status: Abnormal   Collection Time: 07/13/15  4:18 PM  Result Value Ref Range   Lipase 18 (L) 22 - 51 U/L  Lactic acid, plasma     Status: None   Collection Time: 07/13/15  4:18 PM  Result Value Ref Range   Lactic Acid, Venous 1.5 0.5 - 2.0 mmol/L  CBC with Differential     Status: Abnormal   Collection Time: 07/13/15  4:18 PM  Result Value Ref Range   WBC 2.4 (L) 4.0 - 10.5 K/uL   RBC 4.32 3.87 - 5.11 MIL/uL   Hemoglobin 13.3 12.0 - 15.0 g/dL   HCT 40.4 36.0 - 46.0 %   MCV 93.5 78.0 - 100.0 fL   MCH 30.8 26.0 - 34.0 pg   MCHC 32.9 30.0 - 36.0 g/dL   RDW 14.1 11.5 - 15.5 %   Platelets 277 150 - 400 K/uL   Neutrophils Relative % 59 43 - 77 %   Neutro Abs 1.4 (L) 1.7 - 7.7 K/uL   Lymphocytes Relative 23 12 - 46 %   Lymphs Abs 0.6 (L) 0.7 - 4.0 K/uL   Monocytes Relative 18 (  H) 3 - 12 %   Monocytes Absolute 0.4 0.1 - 1.0 K/uL   Eosinophils Relative 0 0 - 5 %   Eosinophils Absolute 0.0 0.0 - 0.7 K/uL   Basophils Relative 0 0 - 1 %   Basophils Absolute 0.0 0.0 - 0.1 K/uL  Urinalysis, Routine w reflex microscopic (not at [facility])     Status: Abnormal   Collection Time: 07/13/15  6:50 PM  Result Value Ref Range   Color, Urine YELLOW YELLOW   APPearance CLOUDY (A) CLEAR   Specific Gravity, Urine >1.030 (H) 1.005 - 1.030   pH 5.5 5.0 - 8.0   Glucose, UA NEGATIVE NEGATIVE mg/dL   Hgb urine dipstick NEGATIVE NEGATIVE   Bilirubin Urine NEGATIVE NEGATIVE   Ketones, ur TRACE (A) NEGATIVE mg/dL   Protein, ur NEGATIVE NEGATIVE mg/dL   Urobilinogen, UA 0.2 0.0 - 1.0 mg/dL   Nitrite POSITIVE (A) NEGATIVE   Leukocytes, UA NEGATIVE NEGATIVE  Urine microscopic-add on     Status: Abnormal   Collection Time: 07/13/15  6:50 PM  Result Value Ref Range   Squamous Epithelial / LPF RARE RARE   WBC, UA 3-6 <3 WBC/hpf   Bacteria, UA MANY (A) RARE  Lactic acid, plasma      Status: None   Collection Time: 07/13/15  7:09 PM  Result Value Ref Range   Lactic Acid, Venous 1.7 0.5 - 2.0 mmol/L  CBC     Status: Abnormal   Collection Time: 07/14/15  6:08 AM  Result Value Ref Range   WBC 2.4 (L) 4.0 - 10.5 K/uL   RBC 3.95 3.87 - 5.11 MIL/uL   Hemoglobin 12.1 12.0 - 15.0 g/dL   HCT 37.2 36.0 - 46.0 %   MCV 94.2 78.0 - 100.0 fL   MCH 30.6 26.0 - 34.0 pg   MCHC 32.5 30.0 - 36.0 g/dL   RDW 14.4 11.5 - 15.5 %   Platelets 243 150 - 400 K/uL  Comprehensive metabolic panel     Status: Abnormal   Collection Time: 07/14/15  6:08 AM  Result Value Ref Range   Sodium 140 135 - 145 mmol/L   Potassium 3.8 3.5 - 5.1 mmol/L   Chloride 109 101 - 111 mmol/L   CO2 25 22 - 32 mmol/L   Glucose, Bld 113 (H) 65 - 99 mg/dL   BUN 24 (H) 6 - 20 mg/dL   Creatinine, Ser 0.74 0.44 - 1.00 mg/dL   Calcium 7.4 (L) 8.9 - 10.3 mg/dL   Total Protein 5.8 (L) 6.5 - 8.1 g/dL   Albumin 3.0 (L) 3.5 - 5.0 g/dL   AST 14 (L) 15 - 41 U/L   ALT 17 14 - 54 U/L   Alkaline Phosphatase 63 38 - 126 U/L   Total Bilirubin 0.7 0.3 - 1.2 mg/dL   GFR calc non Af Amer >60 >60 mL/min   GFR calc Af Amer >60 >60 mL/min    Comment: (NOTE) The eGFR has been calculated using the CKD EPI equation. This calculation has not been validated in all clinical situations. eGFR's persistently <60 mL/min signify possible Chronic Kidney Disease.    Anion gap 6 5 - 15    ABGS No results for input(s): PHART, PO2ART, TCO2, HCO3 in the last 72 hours.  Invalid input(s): PCO2 CULTURES No results found for this or any previous visit (from the past 240 hour(s)). Studies/Results: Dg Chest 2 View  07/13/2015   CLINICAL DATA:  Constant diarrhea and nausea for 2 days. History  of gastritis, esophagitis, dysphagia and esophageal dilatation. Initial encounter.  EXAM: CHEST  2 VIEW  COMPARISON:  06/01/2015.  FINDINGS: The heart size and mediastinal contours are stable. There is marked gaseous distention of the esophagus, especially  at the thoracic inlet. Appearance is unchanged. Left hemidiaphragm elevation and chronic changes at the left base are stable. The lungs are otherwise clear. There is no pleural effusion. Thoracic spine degenerative changes and segmentation anomaly are unchanged.  IMPRESSION: No active cardiopulmonary process. Stable marked esophageal dilatation, likely achalasia.   Electronically Signed   By: Richardean Sale M.D.   On: 07/13/2015 17:00   Ct Abdomen Pelvis W Contrast  07/13/2015   CLINICAL DATA:  Diarrhea.  EXAM: CT ABDOMEN AND PELVIS WITH CONTRAST  TECHNIQUE: Multidetector CT imaging of the abdomen and pelvis was performed using the standard protocol following bolus administration of intravenous contrast.  CONTRAST:  62m OMNIPAQUE IOHEXOL 300 MG/ML SOLN, 1073mOMNIPAQUE IOHEXOL 300 MG/ML SOLN  COMPARISON:  None.  FINDINGS: Mild cardiomegaly. Scarring noted in the lung bases. No pleural effusions.  Gallbladder is distended. Small layering stones noted. There is mild intrahepatic and extrahepatic biliary ductal dilatation. Concern for multiple stones within the common bile duct. This is best seen on coronal image 36. No focal hepatic abnormality. Spleen, pancreas, kidneys are unremarkable except for small benign appearing renal cysts bilaterally. No hydronephrosis.  Nonobstructive bowel gas pattern. Scattered air-fluid levels throughout the large and small bowel. This could reflect gastroenteritis. No free fluid or free air. No adenopathy. Aorta is normal caliber.  Prior hysterectomy. No adnexal masses. Urinary bladder is unremarkable.  No acute bony abnormality or focal bone lesion.  IMPRESSION: Fluid throughout the large and small bowel with scattered air-fluid levels. No evidence of bowel obstruction. Findings could reflect gastroenteritis.  Gallbladder distention with small layering stones. Similar density structures are noted in the dilated common bile duct concerning for common bile duct stones. Intrahepatic  and extrahepatic biliary ductal dilatation. If further evaluation is felt warranted, MRCP or ERCP may be beneficial.  Bilateral renal cysts.  Bibasilar scarring.   Electronically Signed   By: KeRolm Baptise.D.   On: 07/13/2015 18:50    Medications:  Prior to Admission:  Prescriptions prior to admission  Medication Sig Dispense Refill Last Dose  . gabapentin (NEURONTIN) 100 MG capsule Take 300 mg by mouth at bedtime.   11 07/12/2015 at Unknown time  . loperamide (IMODIUM) 1 MG/5ML solution Take 2 mg by mouth as needed for diarrhea or loose stools.   07/12/2015 at Unknown time  . meclizine (ANTIVERT) 25 MG tablet Take 25 mg by mouth at bedtime.   07/12/2015 at Unknown time  . ondansetron (ZOFRAN ODT) 4 MG disintegrating tablet 79m70mDT q4 hours prn nausea/vomit (Patient taking differently: Take 4 mg by mouth every 4 (four) hours as needed for nausea or vomiting. 79mg70mT q4 hours prn nausea/vomit) 12 tablet 0 07/13/2015 at Unknown time  . esomeprazole (NEXIUM) 40 MG packet Take 40 mg by mouth daily before breakfast. 30 each 12   . feeding supplement, ENSURE ENLIVE, (ENSURE ENLIVE) LIQD Take 237 mLs by mouth 3 (three) times daily between meals. 237 mL 12    Scheduled: . antiseptic oral rinse  7 mL Mouth Rinse BID  . enoxaparin (LOVENOX) injection  40 mg Subcutaneous Q24H  . gabapentin  200 mg Oral QHS  . meclizine  25 mg Oral QHS   Continuous: . sodium chloride 75 mL/hr at 07/14/15 0016   PRN:OIZ:[id number]  OR** acetaminophen, promethazine  Assesment: She was admitted with diarrhea and dehydration. It's not totally clear what this is from. She has dysphagia and that has not been fully diagnosed either. She feels a little better. I discussed CODE STATUS with her and she says that her son answer the question about CODE STATUS that she did want to be placed on life support but that she does not want that. I told her we would follow her wishes but that she does need to discuss this with her  family Active Problems:   Dysphagia, pharyngoesophageal phase   Diarrhea    Plan: I will request GI consult, neurology consult because of the concerns that she has some neurological cause of her dysphagia and speech evaluation    LOS: 1 day   Kamela Blansett L 07/14/2015, 8:49 AM

## 2015-07-14 NOTE — Progress Notes (Addendum)
Initial Nutrition Assessment  DOCUMENTATION CODES:  Severe malnutrition in context of acute illness/injury, Underweight  INTERVENTION:  Recommend Multivitamin when able in light of no supplementation at home and a poor oral intake.   NUTRITION DIAGNOSIS:  Inadequate oral intake related to nausea and diarrhea as evidenced by energy intake < or equal to 50% for > or equal to 5 days and a weight loss of roughly 7% since she was discharged 2 weeks ago  GOAL:  Patient will meet greater than or equal to 90% of their needs  MONITOR:  PO intake, Diet advancement, Weight trends, I & O's, Labs, Work up  REASON FOR ASSESSMENT:  Malnutrition Screening Tool    ASSESSMENT:  81 year old female PMHx Osteoporosis, Vertigo, Neuropathy, Gastritis, Dysphagia. presents to the hospital with diarrhea x 3 days. Also with poor PO intake; has BM every time eats. CT shows possible gastroenteritis. Was recently discharged from the hospital on 06/29/2015 for diarrhea/dysphagia/dehydration   Pt was feeling very poorly, only briefly spoke with her. She states that she has not had anything to eat since last Sunday. She had  2 boost supplements that day and then had severe diarrhea. Since that time she has been extremely nauseated and states that even if her diet was advanced she wouldn't eat anything.   She is on a pureed diet at home. She did not take any vitamins or minerals.   She did not know what her usual weight was, but thought that 107 lbs was low. She has lost 8 lbs from her d/c weight 2 weeks ago.   She said she would be okay with a supplement once her diet is advanced if she is feeling better.   Diet Order:  Diet NPO time specified  Skin:  Reviewed, no issues  Last BM:  8/30- watery diarrhea  Height:  Ht Readings from Last 1 Encounters:  06/26/15  (1.651 m)   Weight:  Wt Readings from Last 1 Encounters:  07/13/15 107 lb 5.8 oz (48.7 kg)   Wt Readings from Last 10 Encounters:  07/13/15  107 lb 5.8 oz (48.7 kg)  06/29/15 115 lb 11.2 oz (52.481 kg)  06/24/15 109 lb (49.442 kg)  02/19/15 124 lb (56.246 kg)  02/01/15 124 lb (56.246 kg)  01/27/15 124 lb (56.246 kg)   Ideal Body Weight:  56.82 kg  BMI:  Body mass index is 17.87 kg/(m^2).  Estimated Nutritional Needs:  Kcal:  1400-1500 (29-31 kcal/kg) Protein:  57-68 (1-1.2 g/kg IBW) Fluid:  1.4-1.5 liters + enough to replace stool losses  EDUCATION NEEDS:  No education needs identified at this time  Christophe Louis RD, LDN Nutrition Pager: [phone] 07/14/2015 1:38 PM

## 2015-07-14 NOTE — Care Management Note (Signed)
Case Management Note  Patient Details  Name: Nancy Horn Nancy Horn MRN: [id number] Date of Birth: [birth year]  Subjective/Objective:                  Pt admitted from home with diarrhea and dysphagia. Pt lives alone and will return home at discharge. Pt is fairly independent with ADL's. Pt stated that she has just started using a cane. Pt also active with [facility] RN and PT.  Action/Plan: Will continue to follow for discharge planning needs. Will arrange resumption of [facility] at discharge (per pts choice).  Expected Discharge Date:                  Expected Discharge Plan:  Home w Home Health Services  In-House Referral:  NA  Discharge planning Services  CM Consult  Post Acute Care Choice:  Resumption of Svcs/PTA Provider Choice offered to:  Patient  DME Arranged:    DME Agency:     HH Arranged:  RN HH Agency:  [facility]  Status of Service:  In process, will continue to follow  Medicare Important Message Given:    Date Medicare IM Given:    Medicare IM give by:    Date Additional Medicare IM Given:    Additional Medicare Important Message give by:     If discussed at Long Length of Stay Meetings, dates discussed:    Additional Comments:  Cheryl Flash, RN 07/14/2015, 1:26 PM

## 2015-07-14 NOTE — Evaluation (Signed)
Clinical/Bedside Swallow Evaluation Patient Details  Name: Nancy Horn MRN: [id number] Date of Birth: 09/21/1925  Today's Date: 07/14/2015 Time: SLP Start Time (ACUTE ONLY): 1429 SLP Stop Time (ACUTE ONLY): 1501 SLP Time Calculation (min) (ACUTE ONLY): 32 min  Past Medical History:  Past Medical History  Diagnosis Date  . Osteoporosis   . Vertigo   . Neuropathy   . Candida esophagitis APR 2016  . Helicobacter pylori gastritis APR 2016    treated with amox and biaxin  . Dysphagia    Past Surgical History:  Past Surgical History  Procedure Laterality Date  . Partial hysterectomy    . Esophagogastroduodenoscopy N/A 02/19/2015    Dr.Fields- dilated proximal esophagus, possible mid- esophageal web, mild non-erosive gastritis bx= chronic gastritis with focal activity and hpylori  . Esophageal dilation N/A 02/19/2015    Procedure: ESOPHAGEAL DILATION;  Surgeon: West Bali, MD;  [facility]: [facility];  Service: Endoscopy;  Laterality: N/A;   HPI:  Nancy Horn is an 81 yo woman who was admitted with c/o diarrhea; this is the second episode of diarrhea in 2 weeks. She is known to this SLP from outpatient MBSS May 2016. She was seen at [facility] in July (Dr. Achilles Dunk) for esophageal dysphagia who opined "progressive gut and peripheral neuropathy" and not achalasia. SLP asked to evaluate swallow and recommend diet.   Assessment / Plan / Recommendation Clinical Impression  Known esophageal dysphagia; oropharyngeal appears WNL- initiate full liquid diet    Aspiration Risk  Mild    Diet Recommendation Thin (full liquids)   Medication Administration: Whole meds with liquid    Other  Recommendations Recommended Consults:  (already followed by GI) Oral Care Recommendations: Oral care BID Other Recommendations: Clarify dietary restrictions   Follow Up Recommendations       Frequency and Duration min 2x/week  1 week   Pertinent Vitals/Pain VSS    SLP Swallow Goals   Pt  will demonstrate safe and efficient consumption of least restrictive diet with use of strategies as needed.   Swallow Study Prior Functional Status       General Date of Onset: 07/13/15 Other Pertinent Information: Nancy Horn is an 81 yo woman who was admitted with c/o diarrhea; this is the second episode of diarrhea in 2 weeks. She is known to this SLP from outpatient MBSS May 2016. She was seen at [facility] in July (Dr. Achilles Dunk) for esophageal dysphagia who opined "progressive gut and peripheral neuropathy" and not achalasia. SLP asked to evaluate swallow and recommend diet. Type of Study: Bedside swallow evaluation Previous Swallow Assessment: MBSS May 2016 Diet Prior to this Study: NPO History of Recent Intubation: No Behavior/Cognition: Alert;Cooperative;Pleasant mood Self-Feeding Abilities: Able to feed self Patient Positioning: Upright in bed Baseline Vocal Quality: Normal Volitional Cough: Weak Volitional Swallow: Able to elicit    Oral/Motor/Sensory Function Overall Oral Motor/Sensory Function: Appears within functional limits for tasks assessed   Ice Chips Ice chips: Within functional limits Presentation: Spoon   Thin Liquid Thin Liquid: Within functional limits Presentation: Straw;Cup;Self Fed    Nectar Thick Nectar Thick Liquid: Not tested   Honey Thick Honey Thick Liquid: Not tested   Puree Puree: Impaired (pt gagging after bites due to nausea) Presentation: Spoon   Solid  Thank you,  Havery Moros, CCC-SLP [phone]     Solid: Not tested       PORTER,DABNEY 07/14/2015,3:26 PM

## 2015-07-14 NOTE — Consult Note (Signed)
Azure A. Merlene Laughter, MD     [email or web address]          Nancy Horn is an 81 y.o. female.   ASSESSMENT/PLAN: Chronic dysphagia thought to be due to esophageal dysmotility syndrome. There may be underlying neuropathy causing this although she does not appears to have significant neuropathy. This may be a specific neuropathy to the nerves to the esophagus. She does not have evidence of myopathy or myasthenic syndromes. Feeding to apprise been discussed with the patient and Dr. Luan Pulling. She is a one to get this done. I think we should try patient on pyridostigmine. We'll start does slow increase it gradually. This may help increase motility of the esophagus.  The patient has history of dysphasia last several months. She is being worked up extensively. She has been seen at [facility] and the thought is that she has esophageal dysmotility syndrome. We were consulted for the possibility of neuromuscular problems causing her issues. My impression at that time was that the patient did not have evidence of clear-cut neuromuscular junction disorders or other types of neuromuscular disorders. If she does have a esophageal dysmotility problem, she could have underlying neuropathy. Examination however is fails to show clear evidence of marked global neuropathy. The patient thinks that her dysphagia is about the same. There have not been significant improvements and likelysignificant worsening. She does not appears to have significant dysarthria or focal neurological symptoms. She has been admitted with recurrent diarrhea. She indicates that she has had some difficulties walking since her last hospitalization but at baseline was able to ambulate without assistive devices without evidence of falling. The review of systems otherwise negative.  GENERAL: Thin pleasant female in no acute distress.  HEENT: Supple. Atraumatic normocephalic.   ABDOMEN: soft  EXTREMITIES: No edema   BACK:  Normal.  SKIN: Normal by inspection.    MENTAL STATUS: Alert and oriented. Speech, language and cognition are generally intact. Judgment and insight normal.   CRANIAL NERVES: Pupils are equal, round and reactive to light and accommodation; extra ocular movements are full, there is no significant nystagmus; visual fields are full; upper and lower facial muscles are normal in strength and symmetric, there is no flattening of the nasolabial folds; tongue is midline; uvula is midline; shoulder elevation is normal.  MOTOR: Normal tone, bulk and strength in the upper extremities; no pronator drift. Right lower extremity shows normal tone, bulk and strength. She does have mild left leg weakness on hip flexion 4+/5. Dorsiflexion 5. Bulk and tone normal.  COORDINATION: Left finger to nose is normal, right finger to nose is normal, No rest tremor; no intention tremor; no postural tremor; no bradykinesia.  REFLEXES: Deep tendon reflexes are symmetrical and normal. Babinski reflexes are equivocal bilaterally.   SENSATION: Normal to light touch.   Previous workup including acetylcholine receptor antibody but only binding which was negative. Homocysteine 6.3, vitamin B12 865, CPK 83, TSH 1.9, ANA negative, SPEP negative and anti-striatable antibodies negative.  Brain MRI done a couple weeks ago is reviewed in person. 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 There is moderate global atrophy. There are no lacunar infarcts are cortical infarcts. There is increased perivascular dilated spaces in the basal ganglia bilaterally. No microhemorrhages are appreciated.   [[[[[[[[[[[[[[[[[[[[[[[[MY PRIOR NOTE 06-26-15 1. Unexplained dysphagia. Apparently, there is no clear etiology related to dysfunction or pathology of the esophagus or stomach. The relatively acute onset suggests the possibility of  ischemic stroke. I do not get  the sense that she has been neuromuscular problem although this should be worked up.  RECOMMENDATION: Brain MRI. There is no lab tests for the following: Acetylcholine receptor antibodies antibodies binding, blocking and modulating, CPK and vitamin B12 level.  The patient presents with about a 4 months history of difficulty swallowing. The patient reports that the onset was sudden over the course of the day. She is persistent symptoms with no improvement over the last few months since the onset March of this year. She may have had some worsening over the last 2 months however. She apparently has been worked up extensively here by the Neurologist and Also Seen at [facility]. Apparently No Clear Etiology Has Been Uncovered Although She Tells Me That There May Be Some Mobility Dysfunction As Etiology. There Is No Clear Obstructive or Anatomical Issues However. Patient Does Not Report Having Focal Neurological Deficit Especially the Onset of These Symptoms. She Denies Focal Numbness, Weakness (except for possibly left leg weakness she noticed today which was picked up on examination), Dysarthria, Blurred Vision or Diplopia. She Has Had Some Dizziness However. She denies chest pain or shows of breath. There is no history of headaches. The patient presented to the hospital with a 3 day history of diarrhea. This has improved since being admitted to the hospital. The review systems otherwise negative.]]]]]]]]]]]]]]]]]]]]]]]]]]]]]]]]]]] Blood pressure 114/52, pulse 75, temperature 97.7 F (36.5 C), temperature source Oral, resp. rate 20, weight 48.7 kg (107 lb 5.8 oz), SpO2 97 %.  Past Medical History  Diagnosis Date  . Osteoporosis   . Vertigo   . Neuropathy   . Candida esophagitis APR 2016  . Helicobacter pylori gastritis APR 2016    treated with amox and biaxin  . Dysphagia     Past Surgical History  Procedure Laterality Date  . Partial hysterectomy    . Esophagogastroduodenoscopy N/A 02/19/2015     Dr.Fields- dilated proximal esophagus, possible mid- esophageal web, mild non-erosive gastritis bx= chronic gastritis with focal activity and hpylori  . Esophageal dilation N/A 02/19/2015    Procedure: ESOPHAGEAL DILATION;  Surgeon: Sandi L Fields, MD;  Location: [facility];  Service: Endoscopy;  Laterality: N/A;    Family History  Problem Relation Age of Onset  . Colon cancer Neg Hx     Social History:  reports that she has never smoked. She does not have any smokeless tobacco history on file. She reports that she does not drink alcohol or use illicit drugs.  Allergies: No Known Allergies  Medications: Prior to Admission medications   Medication Sig Start Date End Date Taking? Authorizing Provider  gabapentin (NEURONTIN) 100 MG capsule Take 300 mg by mouth at bedtime.  01/01/15  Yes Historical Provider, MD  loperamide (IMODIUM) 1 MG/5ML solution Take 2 mg by mouth as needed for diarrhea or loose stools.   Yes Historical Provider, MD  meclizine (ANTIVERT) 25 MG tablet Take 25 mg by mouth at bedtime.   Yes Historical Provider, MD  ondansetron (ZOFRAN ODT) 4 MG disintegrating tablet 4mg ODT q4 hours prn nausea/vomit Patient taking differently: Take 4 mg by mouth every 4 (four) hours as needed for nausea or vomiting. 4mg ODT q4 hours prn nausea/vomit 06/24/15  Yes Joseph Zammit, MD  esomeprazole (NEXIUM) 40 MG packet Take 40 mg by mouth daily before breakfast. 06/29/15   Edward Hawkins, MD  feeding supplement, ENSURE ENLIVE, (ENSURE ENLIVE) LIQD Take 237 mLs by mouth 3 (three) times daily between meals. 06/29/15     Edward Hawkins, MD    Scheduled Meds: . antiseptic oral rinse  7 mL Mouth Rinse BID  . enoxaparin (LOVENOX) injection  40 mg Subcutaneous Q24H  . gabapentin  200 mg Oral QHS  . loperamide  2 mg Oral Q6H  . meclizine  25 mg Oral QHS   Continuous Infusions: . sodium chloride 75 mL/hr at 07/14/15 1110   PRN Meds:.acetaminophen **OR** acetaminophen, promethazine     Results  for orders placed or performed during the hospital encounter of 07/13/15 (from the past 48 hour(s))  Comprehensive metabolic panel     Status: Abnormal   Collection Time: 07/13/15  4:18 PM  Result Value Ref Range   Sodium 138 135 - 145 mmol/L   Potassium 4.3 3.5 - 5.1 mmol/L   Chloride 103 101 - 111 mmol/L   CO2 27 22 - 32 mmol/L   Glucose, Bld 142 (H) 65 - 99 mg/dL   BUN 31 (H) 6 - 20 mg/dL   Creatinine, Ser 0.81 0.44 - 1.00 mg/dL   Calcium 8.4 (L) 8.9 - 10.3 mg/dL   Total Protein 6.8 6.5 - 8.1 g/dL   Albumin 3.6 3.5 - 5.0 g/dL   AST 22 15 - 41 U/L   ALT 22 14 - 54 U/L   Alkaline Phosphatase 78 38 - 126 U/L   Total Bilirubin 0.7 0.3 - 1.2 mg/dL   GFR calc non Af Amer >60 >60 mL/min   GFR calc Af Amer >60 >60 mL/min    Comment: (NOTE) The eGFR has been calculated using the CKD EPI equation. This calculation has not been validated in all clinical situations. eGFR's persistently <60 mL/min signify possible Chronic Kidney Disease.    Anion gap 8 5 - 15  Lipase, blood     Status: Abnormal   Collection Time: 07/13/15  4:18 PM  Result Value Ref Range   Lipase 18 (L) 22 - 51 U/L  Lactic acid, plasma     Status: None   Collection Time: 07/13/15  4:18 PM  Result Value Ref Range   Lactic Acid, Venous 1.5 0.5 - 2.0 mmol/L  CBC with Differential     Status: Abnormal   Collection Time: 07/13/15  4:18 PM  Result Value Ref Range   WBC 2.4 (L) 4.0 - 10.5 K/uL   RBC 4.32 3.87 - 5.11 MIL/uL   Hemoglobin 13.3 12.0 - 15.0 g/dL   HCT 40.4 36.0 - 46.0 %   MCV 93.5 78.0 - 100.0 fL   MCH 30.8 26.0 - 34.0 pg   MCHC 32.9 30.0 - 36.0 g/dL   RDW 14.1 11.5 - 15.5 %   Platelets 277 150 - 400 K/uL   Neutrophils Relative % 59 43 - 77 %   Neutro Abs 1.4 (L) 1.7 - 7.7 K/uL   Lymphocytes Relative 23 12 - 46 %   Lymphs Abs 0.6 (L) 0.7 - 4.0 K/uL   Monocytes Relative 18 (H) 3 - 12 %   Monocytes Absolute 0.4 0.1 - 1.0 K/uL   Eosinophils Relative 0 0 - 5 %   Eosinophils Absolute 0.0 0.0 - 0.7 K/uL    Basophils Relative 0 0 - 1 %   Basophils Absolute 0.0 0.0 - 0.1 K/uL  Urinalysis, Routine w reflex microscopic (not at [facility])     Status: Abnormal   Collection Time: 07/13/15  6:50 PM  Result Value Ref Range   Color, Urine YELLOW YELLOW   APPearance CLOUDY (A) CLEAR   Specific Gravity, Urine >1.030 (H)   1.005 - 1.030   pH 5.5 5.0 - 8.0   Glucose, UA NEGATIVE NEGATIVE mg/dL   Hgb urine dipstick NEGATIVE NEGATIVE   Bilirubin Urine NEGATIVE NEGATIVE   Ketones, ur TRACE (A) NEGATIVE mg/dL   Protein, ur NEGATIVE NEGATIVE mg/dL   Urobilinogen, UA 0.2 0.0 - 1.0 mg/dL   Nitrite POSITIVE (A) NEGATIVE   Leukocytes, UA NEGATIVE NEGATIVE  Urine microscopic-add on     Status: Abnormal   Collection Time: 07/13/15  6:50 PM  Result Value Ref Range   Squamous Epithelial / LPF RARE RARE   WBC, UA 3-6 <3 WBC/hpf   Bacteria, UA MANY (A) RARE  Lactic acid, plasma     Status: None   Collection Time: 07/13/15  7:09 PM  Result Value Ref Range   Lactic Acid, Venous 1.7 0.5 - 2.0 mmol/L  CBC     Status: Abnormal   Collection Time: 07/14/15  6:08 AM  Result Value Ref Range   WBC 2.4 (L) 4.0 - 10.5 K/uL   RBC 3.95 3.87 - 5.11 MIL/uL   Hemoglobin 12.1 12.0 - 15.0 g/dL   HCT 37.2 36.0 - 46.0 %   MCV 94.2 78.0 - 100.0 fL   MCH 30.6 26.0 - 34.0 pg   MCHC 32.5 30.0 - 36.0 g/dL   RDW 14.4 11.5 - 15.5 %   Platelets 243 150 - 400 K/uL  Comprehensive metabolic panel     Status: Abnormal   Collection Time: 07/14/15  6:08 AM  Result Value Ref Range   Sodium 140 135 - 145 mmol/L   Potassium 3.8 3.5 - 5.1 mmol/L   Chloride 109 101 - 111 mmol/L   CO2 25 22 - 32 mmol/L   Glucose, Bld 113 (H) 65 - 99 mg/dL   BUN 24 (H) 6 - 20 mg/dL   Creatinine, Ser 0.74 0.44 - 1.00 mg/dL   Calcium 7.4 (L) 8.9 - 10.3 mg/dL   Total Protein 5.8 (L) 6.5 - 8.1 g/dL   Albumin 3.0 (L) 3.5 - 5.0 g/dL   AST 14 (L) 15 - 41 U/L   ALT 17 14 - 54 U/L   Alkaline Phosphatase 63 38 - 126 U/L   Total Bilirubin 0.7 0.3 - 1.2 mg/dL   GFR  calc non Af Amer >60 >60 mL/min   GFR calc Af Amer >60 >60 mL/min    Comment: (NOTE) The eGFR has been calculated using the CKD EPI equation. This calculation has not been validated in all clinical situations. eGFR's persistently <60 mL/min signify possible Chronic Kidney Disease.    Anion gap 6 5 - 15  C difficile quick scan w PCR reflex     Status: None   Collection Time: 07/14/15  4:40 PM  Result Value Ref Range   C Diff antigen NEGATIVE NEGATIVE   C Diff toxin NEGATIVE NEGATIVE   C Diff interpretation Negative for toxigenic C. difficile     Studies/Results:     Nilson Tabora A. Merlene Laughter, M.D.  Diplomate, Tax adviser of Psychiatry and Neurology ( Neurology). 07/14/2015, 7:48 PM

## 2015-07-15 DIAGNOSIS — R1310 Dysphagia, unspecified: Secondary | ICD-10-CM | POA: Insufficient documentation

## 2015-07-15 DIAGNOSIS — R1319 Other dysphagia: Secondary | ICD-10-CM | POA: Insufficient documentation

## 2015-07-15 DIAGNOSIS — E43 Unspecified severe protein-calorie malnutrition: Secondary | ICD-10-CM | POA: Insufficient documentation

## 2015-07-15 DIAGNOSIS — R1314 Dysphagia, pharyngoesophageal phase: Principal | ICD-10-CM

## 2015-07-15 DIAGNOSIS — R197 Diarrhea, unspecified: Secondary | ICD-10-CM

## 2015-07-15 DIAGNOSIS — K8070 Calculus of gallbladder and bile duct without cholecystitis without obstruction: Secondary | ICD-10-CM

## 2015-07-15 LAB — CBC WITH DIFFERENTIAL/PLATELET
BASOS ABS: 0 10*3/uL (ref 0.0–0.1)
BASOS PCT: 0 % (ref 0–1)
Eosinophils Absolute: 0 10*3/uL (ref 0.0–0.7)
Eosinophils Relative: 0 % (ref 0–5)
HCT: 33.7 % — ABNORMAL LOW (ref 36.0–46.0)
HEMOGLOBIN: 10.9 g/dL — AB (ref 12.0–15.0)
Lymphocytes Relative: 19 % (ref 12–46)
Lymphs Abs: 1 10*3/uL (ref 0.7–4.0)
MCH: 30.6 pg (ref 26.0–34.0)
MCHC: 32.3 g/dL (ref 30.0–36.0)
MCV: 94.7 fL (ref 78.0–100.0)
MONO ABS: 0.6 10*3/uL (ref 0.1–1.0)
Monocytes Relative: 12 % (ref 3–12)
NEUTROS ABS: 3.6 10*3/uL (ref 1.7–7.7)
NEUTROS PCT: 69 % (ref 43–77)
Platelets: 232 10*3/uL (ref 150–400)
RBC: 3.56 MIL/uL — ABNORMAL LOW (ref 3.87–5.11)
RDW: 14.4 % (ref 11.5–15.5)
WBC MORPHOLOGY: INCREASED
WBC: 5.3 10*3/uL (ref 4.0–10.5)

## 2015-07-15 LAB — GLUCOSE, CAPILLARY
GLUCOSE-CAPILLARY: 61 mg/dL — AB (ref 65–99)
GLUCOSE-CAPILLARY: 81 mg/dL (ref 65–99)
GLUCOSE-CAPILLARY: 89 mg/dL (ref 65–99)
Glucose-Capillary: 69 mg/dL (ref 65–99)
Glucose-Capillary: 94 mg/dL (ref 65–99)

## 2015-07-15 MED ORDER — PYRIDOSTIGMINE BROMIDE 60 MG PO TABS
60.0000 mg | ORAL_TABLET | Freq: Three times a day (TID) | ORAL | Status: DC
  Administered 2015-07-15 – 2015-07-20 (×16): 60 mg via ORAL
  Filled 2015-07-15 (×19): qty 1

## 2015-07-15 NOTE — Progress Notes (Signed)
Subjective:  Patient tearful. Was told she needs PEG tube because of her swallowing issues. Has been unable to swallow anything in several days. Managing her own secretions okay. States her diarrhea has resolved. None overnight. Denies abdominal pain.  Objective: Vital signs in last 24 hours: Temp:  [97.7 F (36.5 C)-98.9 F (37.2 C)] 98.5 F (36.9 C) (09/01 0551) Pulse Rate:  [64-79] 72 (09/01 0551) Resp:  [20] 20 (09/01 0551) BP: (100-114)/(43-52) 113/45 mmHg (09/01 0551) SpO2:  [96 %-97 %] 97 % (09/01 0551) Last BM Date: 07/14/15 General:   Alert,  thin pleasant and cooperative in NAD Head:  Normocephalic and atraumatic. Eyes:  Sclera clear, no icterus.  Abdomen:  Soft, nontender and nondistended.   Normal bowel sounds, without guarding, and without rebound.   Extremities:  Without clubbing, deformity or edema. Neurologic:  Alert and  oriented x4;  grossly normal neurologically. Skin:  Intact without significant lesions or rashes. Psych:  Alert and cooperative. Normal mood and affect.  Intake/Output from previous day: 08/31 0701 - 09/01 0700 In: 750 [I.V.:750] Out: -  Intake/Output this shift:    Lab Results: CBC  Recent Labs  07/13/15 1618 07/14/15 0608 07/15/15 0643  WBC 2.4* 2.4* 5.3  HGB 13.3 12.1 10.9*  HCT 40.4 37.2 33.7*  MCV 93.5 94.2 94.7  PLT 277 243 232   BMET  Recent Labs  07/13/15 1618 07/14/15 0608  NA 138 140  K 4.3 3.8  CL 103 109  CO2 27 25  GLUCOSE 142* 113*  BUN 31* 24*  CREATININE 0.81 0.74  CALCIUM 8.4* 7.4*   LFTs  Recent Labs  07/13/15 1618 07/14/15 0608  BILITOT 0.7 0.7  ALKPHOS 78 63  AST 22 14*  ALT 22 17  PROT 6.8 5.8*  ALBUMIN 3.6 3.0*    Recent Labs  07/13/15 1618  LIPASE 18*   PT/INR No results for input(s): LABPROT, INR in the last 72 hours.    Imaging Studies: Dg Chest 2 View  07/13/2015   CLINICAL DATA:  Constant diarrhea and nausea for 2 days. History of gastritis, esophagitis, dysphagia and  esophageal dilatation. Initial encounter.  EXAM: CHEST  2 VIEW  COMPARISON:  06/01/2015.  FINDINGS: The heart size and mediastinal contours are stable. There is marked gaseous distention of the esophagus, especially at the thoracic inlet. Appearance is unchanged. Left hemidiaphragm elevation and chronic changes at the left base are stable. The lungs are otherwise clear. There is no pleural effusion. Thoracic spine degenerative changes and segmentation anomaly are unchanged.  IMPRESSION: No active cardiopulmonary process. Stable marked esophageal dilatation, likely achalasia.   Electronically Signed   By: Carey Bullocks M.D.   On: 07/13/2015 17:00   Mr Brain Wo Contrast  06/28/2015   CLINICAL DATA:  Vertigo for 3 days with weakness.  Kyphosis.  EXAM: MRI HEAD WITHOUT CONTRAST  TECHNIQUE: Multiplanar, multiecho pulse sequences of the brain and surrounding structures were obtained without intravenous contrast.  COMPARISON:  CT head 07/15/2008.  FINDINGS: No evidence for acute infarction, hemorrhage, mass lesion, hydrocephalus, or extra-axial fluid. Generalized cerebral and cerebellar atrophy. Moderately advanced T2 and FLAIR hyperintensities throughout the periventricular and subcortical white matter suggesting small vessel disease. Prominent perivascular spaces likely secondary to hypertension. No foci of chronic hemorrhage. Flow voids are preserved. Pituitary, pineal, and cerebellar tonsils unremarkable. No upper cervical lesions. BILATERAL cataract extraction. No sinus air-fluid level. Negative mastoids. Extracranial soft tissues unremarkable.  IMPRESSION: Chronic changes as described. No acute intracranial findings are observed.  Electronically Signed   By: Elsie Stain M.D.   On: 06/28/2015 18:35   Ct Abdomen Pelvis W Contrast  07/13/2015   CLINICAL DATA:  Diarrhea.  EXAM: CT ABDOMEN AND PELVIS WITH CONTRAST  TECHNIQUE: Multidetector CT imaging of the abdomen and pelvis was performed using the standard  protocol following bolus administration of intravenous contrast.  CONTRAST:  50mL OMNIPAQUE IOHEXOL 300 MG/ML SOLN, OMNIPAQUE IOHEXOL 300 MG/ML SOLN  COMPARISON:  None.  FINDINGS: Mild cardiomegaly. Scarring noted in the lung bases. No pleural effusions.  Gallbladder is distended. Small layering stones noted. There is mild intrahepatic and extrahepatic biliary ductal dilatation. Concern for multiple stones within the common bile duct. This is best seen on coronal image 36. No focal hepatic abnormality. Spleen, pancreas, kidneys are unremarkable except for small benign appearing renal cysts bilaterally. No hydronephrosis.  Nonobstructive bowel gas pattern. Scattered air-fluid levels throughout the large and small bowel. This could reflect gastroenteritis. No free fluid or free air. No adenopathy. Aorta is normal caliber.  Prior hysterectomy. No adnexal masses. Urinary bladder is unremarkable.  No acute bony abnormality or focal bone lesion.  IMPRESSION: Fluid throughout the large and small bowel with scattered air-fluid levels. No evidence of bowel obstruction. Findings could reflect gastroenteritis.  Gallbladder distention with small layering stones. Similar density structures are noted in the dilated common bile duct concerning for common bile duct stones. Intrahepatic and extrahepatic biliary ductal dilatation. If further evaluation is felt warranted, MRCP or ERCP may be beneficial.  Bilateral renal cysts.  Bibasilar scarring.   Electronically Signed   By: Charlett Nose M.D.   On: 07/13/2015 18:50  [2 weeks]   Assessment: 81 year old female with readmission for recurrent diarrhea. Admitted two weeks ago with diarrhea. C. difficile and stool cultures negative at that time. States her diarrhea resolved but recurred Sunday evening. Describes profuse nonbloody diarrhea. Has been unable to eat. CT did not reveal any wall thickening to the small and large bowel. She had air-fluid levels noted in the colon.  Stool studies submitted yesterday evening (C. difficile negative, GI pathogen panel pending). This morning she reports diarrhea has improved. No bowel movements overnight. ?recurrent diarrhea related to progressive gut neuropathy.   Dysphagia (progressive over the past 6 months), extensively evaluated here locally and Dr. Gwinda Passe at [facility]. EGD in April showed dilated proximal esophagus, questionable mid esophageal web. Barium esophagram May 2016 with diffuse gaseous distention of the esophagus throughout its length, markedly hypotonic and hypomobile thoracic esophagus with poor peristaltic activity. Marked prolonged retention of contrast material. Some laryngeal penetration and aspiration into the proximal trachea noted. Seen by Dr. Gwinda Passe as recently as 06/23/2015. 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 He referred her to neurology. Serum protein electrophoresis, thyroid test were all unremarkable. ANA screen was negative. CT scan of the chest and abdomen (July 2016 at [facility]) ruled out malignancy. She did have evidence of filling defect in the distal common bile duct on that study as well. He was check and heavy metal screen. She is also had a negative anti-striation antibody, acetylcholine receptor antibody. B12 normal. Seen by speech therapy yesterday with no evidence of significant oral pharyngeal dysphagia. Patient having difficulty swallowing anything at this point.  Cholelithiasis as well as choledocholithiasis without biliary symptoms and with normal transaminases.  Plan: 1. Follow-up pending GI pathogen panel. 2. Dr. Juanetta Gosling spoke with patient regarding possibility of PEG placement. Discussed  PEG with patient at length. Given her significant dysphagia related to esophageal issues, this would be an option to maintain nutrition if she decides to pursue. She wants to think about it  today. 3. Choledocholithiasis suspected on 2 studies in the past 6 weeks. To discuss possible ERCP with Dr. Jena Gauss.  Leanna Battles. [facility] [phone] 9/1/20169:54 AM     LOS: 2 days    Attending note:  Patient seen and examined this evening.  Patient really can't swallow at all. Recent imaging studies reviewed with Dr. Tyron Russell.  Patient appears to have severe esophageal dysmotility. I would also be concerned about a more diffuse GI motility disorder. The stomach is massively dilated and fluid-filled, measuring about 25 cm in length. No evidence of obstruction on CT; EGD not too long ago.  There may be a significant problem with delayed gastric emptying.  A PEG tube may be useful for venting and nutritional support. However, she may may need a PEJ extension, ultimately. She also has compelling CT evidence for a common duct stone(s).  Fortunately, she has no evidence of obstruction at this time.  I recommend we get her stomach decompressed and nutritional support set up prior to pursuing an ERCP  Over the short run, I do not believe she would tolerate an attempt at placing an NG tube and that may not be technically feasible anyway.  Diarrhea persists. Stool studies pending.  If a PEG tube is pursued, patient wants to wait until September 3, when her sister will have arrived from [place].

## 2015-07-15 NOTE — Evaluation (Signed)
Physical Therapy Evaluation Patient Details Name: Nancy Horn MRN: [id number] DOB: 1925-10-14 Today's Date: 07/15/2015   History of Present Illness  81 year old female who  has a past medical history of Osteoporosis; Vertigo; Neuropathy; Candida esophagitis (APR 2016); Helicobacter pylori gastritis (APR 2016); and Dysphagia.81 year old female who  has a past medical history of Osteoporosis; Vertigo; Neuropathy; Candida esophagitis (APR 2016); Helicobacter pylori gastritis (APR 2016); and Dysphagia. She is admitted for the second time in 2 weeks with diarrhea.  Her dysphasia has also worsened to the point where a PEG tube is now being recommended.  She normally lives alone independently, ambulates with a cane or walker.  At the last PT evaluation she was also found to have decreased dynamic standing balance  Clinical Impression   Pt was seen for evaluation.  She was found supine in bed, stating that she is very tired.  Currently she is needing frequent suction of her mouth as she is unable to even swallow her saliva.  She was found to be quite weak and deconditioned, unable to try to mobilize out of bed this afternoon.  Because she lives alone with minimal family support I am recommending SNF at d/c.  Pt is agreeable.    Follow Up Recommendations SNF    Equipment Recommendations  None recommended by PT    Recommendations for Other Services       Precautions / Restrictions Precautions Precautions: Fall Restrictions Weight Bearing Restrictions: No      Mobility  Bed Mobility               General bed mobility comments: too fatigued to try to mobilze out of bed  Transfers  Unable                    Ambulation/Gait      unable                          Balance Overall balance assessment:  (unable to assess)                                           Pertinent Vitals/Pain Pain Assessment: No/denies pain    Home Living  Family/patient expects to be discharged to:: Skilled nursing facility                                               Extremity/Trunk Assessment   Upper Extremity Assessment: Generalized weakness           Lower Extremity Assessment: LLE deficits/detail (peripheral neuropathy bilaterally)   LLE Deficits / Details: there is erythema on the dorsum of the foot and up the shin...there is warmth as compared to the RLE.Marland KitchenMarland KitchenRN was alerted  Cervical / Trunk Assessment: Kyphotic  Communication      Cognition Arousal/Alertness: Awake/alert Behavior During Therapy: WFL for tasks assessed/performed Overall Cognitive Status: Within Functional Limits for tasks assessed                            Exercises General Exercises - Lower Extremity Ankle Circles/Pumps: AROM;Both;10 reps;Supine Quad Sets: AROM;Both;10 reps;Supine Short Arc Quad: AROM;Both;10 reps;Supine Heel Slides: AROM;AAROM;Both;10 reps;Supine Hip ABduction/ADduction: AAROM;Both;10 reps;Supine  Assessment/Plan    PT Assessment Patient needs continued PT services  PT Diagnosis Difficulty walking;Generalized weakness   PT Problem List Decreased strength;Decreased activity tolerance;Decreased mobility;Impaired sensation  PT Treatment Interventions Gait training;Functional mobility training;Therapeutic exercise   PT Goals (Current goals can be found in the Care Plan section) Acute Rehab PT Goals Patient Stated Goal: to be able to go back home    Frequency Min 3X/week   Barriers to discharge Decreased caregiver support lives alone                   End of Session Equipment Utilized During Treatment:  (none) Activity Tolerance: Patient limited by fatigue Patient left: in bed;with call bell/phone within reach;with bed alarm set Nurse Communication: Mobility status         Time: 1630-1700 PT Time Calculation (min) (ACUTE ONLY): 30 min   Charges:   PT Evaluation $Initial PT  Evaluation Tier I: 1 Procedure     PT G CodesKonrad Penta  [phone] 07/15/2015, 5:11 PM

## 2015-07-15 NOTE — Progress Notes (Signed)
Speech Language Pathology Dysphagia Treatment Patient Details Name: Nancy Horn MRN: [id number] DOB: August 17, 1925 Today's Date: 07/15/2015 Time:  - 9:36 PM    Assessment / Plan / Recommendation Clinical Impression  [name] is known to this SLP from previous MBSS completed in May 2016:  <<Nancy Horn is an 81 yo female patient for Dr. Juanetta Gosling who was referred to Dr. Gladstone Lighter for increasing dysphagia. Pt states that she "occasionally felt like food didn't go down", but that in February 2016 she noticed great difficulty swallowing soup. Symptoms worsened and she complained of solid food and pill dysphagia and hoarse vocal quality. She underwent EGD 02/19/2015 with dilation with Dr. Darrick Penna. (ENDOSCOPIC IMPRESSION: DILATED PROXIMAL ESOPHAGUS.POSSIBLE MID-ESOPHAGEAL WEB. MILD Non-erosive gastritis).Shortly after she complained of "filling up with liquids" and increased hoarseness. Her stomach Bx showed CANDIDA ESOPHAGITIS and H. Pylori infection which were treated with diflucan for 21 days followed by ABX. She was also prescribed a PPI. Pt was referred for MBSS due to continued complaints of dysphagia.  Pt's oropharyngeal swallow was essentially [facility] for textures presented with some difficulty clearing barium tablet throught UES. Esophageal phase was markedly impaired characterized by air-filled, completely distended cervical esophagus. Radiologist confirms:The esophagus is dilated with gas into the cervical region. Imaging lower demonstrates areas of spasm and corkscrew appearance of the distal esophagus with dilated gas filled esophagus proximally. The pill sticks in the distal esophagus for several minutes.  No penetration or aspiration observed today, mild residuals in lateral channels with thin liquids which eventually clear. Pt's symptoms likely related to esophageal findings. Pt's pharyngeal area appears slightly swollen and moves visibly outward with inhalations. Pt was dilated  in early April so it is possible that pt has underlying achalasia. Strongly recommend further assessment to assess for any anatomic abnormalities. In the meantime, pt educated on reflux precautions and advised to eat several small meals throughout the day, elevate HOB,etc.  >> Note from [facility] May 2016   07/15/2015: [name] appears anxious and states, "It is getting worse". She was repositioned in bed with assist from RN and given her evening medications followed by sips of Sprite and immediately coughed and requested suction. SLP encouraged pt to avoid carbonated beverages and sip on small sips of water, swallow 2x for each sip and she did very well. Pt needs a lot of encouragement and reassurance. She asked me to speak with her sister, Nancy Horn, on the phone which I did. She wonders if the doctor might be able to give her sister/pt something for anxiety and/or an antidepressant. I asked the pt if she would consider taking something for anxiety and she responded that she would. This will need to be addressed by Dr. Juanetta Gosling. Pt understands. Pt with severe esophageal dysphagia and prominent cricopharyngeus which increases her risk for aspiration. Feeding tube is being discussed, however I have concerns that PEG tube would still cause her distress; consider J-tube. SLP will follow per goals of care. Her sister, Nancy Horn was appreciative of the phone call and will be in [place] tomorrow.     Diet Recommendation  Continue with Current Diet: Thin liquid    SLP Plan Continue with current plan of care   Pertinent Vitals/Pain VSS       General Behavior/Cognition: Alert;Cooperative;Pleasant mood Patient Positioning: Upright in bed Oral care provided: N/A Other Pertinent Information: Nancy Horn is an 81 yo woman who was admitted with c/o diarrhea; this is the second episode of diarrhea in 2 weeks. She is known  to this SLP from outpatient MBSS May 2016. She was seen at [facility] in July (Dr. Achilles Dunk) for  esophageal dysphagia who opined "progressive gut and peripheral neuropathy" and not achalasia. SLP asked to evaluate swallow and recommend diet.  Oral Cavity - Oral Hygiene     Dysphagia Treatment Treatment focused on: Skilled observation of diet tolerance;Patient/family/caregiver education;Facilitation of pharyngeal phase;Utilization of compensatory strategies Family/Caregiver Educated: son, Dorene Sorrow and spoke with sister, Nancy Horn on the phone Treatment Methods: Skilled observation;Differential diagnosis;Patient/caregiver education Patient observed directly with PO's: Yes Type of PO's observed: Thin liquids Feeding: Able to feed self Liquids provided via: Cup Pharyngeal Phase Signs & Symptoms: Delayed cough;Complaints of globus;Immediate cough;Watery eyes Type of cueing: Verbal;Tactile Amount of cueing: Moderate   Thank you,  Havery Moros, CCC-SLP [phone]      Lasandra Batley 07/15/2015, 9:36 PM

## 2015-07-15 NOTE — Progress Notes (Signed)
Subjective: She says she feels about the same. She is still having trouble swallowing. We discussed all of her situation and I don't think we are going to be able to make her swallowing any better. We discussed tube feeding and she felt that although she normally would not be interested in a feeding tube per previous opinion about that was if she was in a coma or had severe dementia etc. but considering that she is otherwise doing well and a feeding tube may allow her to maintain her nutrition she does want to consider that.  Objective: Vital signs in last 24 hours: Temp:  [97.7 F (36.5 C)-98.9 F (37.2 C)] 98.5 F (36.9 C) (09/01 0551) Pulse Rate:  [64-79] 72 (09/01 0551) Resp:  [20] 20 (09/01 0551) BP: (100-114)/(43-52) 113/45 mmHg (09/01 0551) SpO2:  [96 %-97 %] 97 % (09/01 0551) Weight change:  Last BM Date: 07/14/15  Intake/Output from previous day: 08/31 0701 - 09/01 0700 In: 750 [I.V.:750] Out: -   PHYSICAL EXAM General appearance: alert, cooperative, no distress and Very thin Resp: clear to auscultation bilaterally Cardio: regular rate and rhythm, S1, S2 normal, no murmur, click, rub or gallop GI: soft, non-tender; bowel sounds normal; no masses,  no organomegaly Extremities: extremities normal, atraumatic, no cyanosis or edema  Lab Results:  Results for orders placed or performed during the hospital encounter of 07/13/15 (from the past 48 hour(s))  Comprehensive metabolic panel     Status: Abnormal   Collection Time: 07/13/15  4:18 PM  Result Value Ref Range   Sodium 138 135 - 145 mmol/L   Potassium 4.3 3.5 - 5.1 mmol/L   Chloride 103 101 - 111 mmol/L   CO2 27 22 - 32 mmol/L   Glucose, Bld 142 (H) 65 - 99 mg/dL   BUN 31 (H) 6 - 20 mg/dL   Creatinine, Ser 0.81 0.44 - 1.00 mg/dL   Calcium 8.4 (L) 8.9 - 10.3 mg/dL   Total Protein 6.8 6.5 - 8.1 g/dL   Albumin 3.6 3.5 - 5.0 g/dL   AST 22 15 - 41 U/L   ALT 22 14 - 54 U/L   Alkaline Phosphatase 78 38 - 126 U/L    Total Bilirubin 0.7 0.3 - 1.2 mg/dL   GFR calc non Af Amer >60 >60 mL/min   GFR calc Af Amer >60 >60 mL/min    Comment: (NOTE) The eGFR has been calculated using the CKD EPI equation. This calculation has not been validated in all clinical situations. eGFR's persistently <60 mL/min signify possible Chronic Kidney Disease.    Anion gap 8 5 - 15  Lipase, blood     Status: Abnormal   Collection Time: 07/13/15  4:18 PM  Result Value Ref Range   Lipase 18 (L) 22 - 51 U/L  Lactic acid, plasma     Status: None   Collection Time: 07/13/15  4:18 PM  Result Value Ref Range   Lactic Acid, Venous 1.5 0.5 - 2.0 mmol/L  CBC with Differential     Status: Abnormal   Collection Time: 07/13/15  4:18 PM  Result Value Ref Range   WBC 2.4 (L) 4.0 - 10.5 K/uL   RBC 4.32 3.87 - 5.11 MIL/uL   Hemoglobin 13.3 12.0 - 15.0 g/dL   HCT 40.4 36.0 - 46.0 %   MCV 93.5 78.0 - 100.0 fL   MCH 30.8 26.0 - 34.0 pg   MCHC 32.9 30.0 - 36.0 g/dL   RDW 14.1 11.5 - 15.5 %  Platelets 277 150 - 400 K/uL   Neutrophils Relative % 59 43 - 77 %   Neutro Abs 1.4 (L) 1.7 - 7.7 K/uL   Lymphocytes Relative 23 12 - 46 %   Lymphs Abs 0.6 (L) 0.7 - 4.0 K/uL   Monocytes Relative 18 (H) 3 - 12 %   Monocytes Absolute 0.4 0.1 - 1.0 K/uL   Eosinophils Relative 0 0 - 5 %   Eosinophils Absolute 0.0 0.0 - 0.7 K/uL   Basophils Relative 0 0 - 1 %   Basophils Absolute 0.0 0.0 - 0.1 K/uL  Urinalysis, Routine w reflex microscopic (not at [facility])     Status: Abnormal   Collection Time: 07/13/15  6:50 PM  Result Value Ref Range   Color, Urine YELLOW YELLOW   APPearance CLOUDY (A) CLEAR   Specific Gravity, Urine >1.030 (H) 1.005 - 1.030   pH 5.5 5.0 - 8.0   Glucose, UA NEGATIVE NEGATIVE mg/dL   Hgb urine dipstick NEGATIVE NEGATIVE   Bilirubin Urine NEGATIVE NEGATIVE   Ketones, ur TRACE (A) NEGATIVE mg/dL   Protein, ur NEGATIVE NEGATIVE mg/dL   Urobilinogen, UA 0.2 0.0 - 1.0 mg/dL   Nitrite POSITIVE (A) NEGATIVE   Leukocytes, UA  NEGATIVE NEGATIVE  Urine microscopic-add on     Status: Abnormal   Collection Time: 07/13/15  6:50 PM  Result Value Ref Range   Squamous Epithelial / LPF RARE RARE   WBC, UA 3-6 <3 WBC/hpf   Bacteria, UA MANY (A) RARE  Lactic acid, plasma     Status: None   Collection Time: 07/13/15  7:09 PM  Result Value Ref Range   Lactic Acid, Venous 1.7 0.5 - 2.0 mmol/L  CBC     Status: Abnormal   Collection Time: 07/14/15  6:08 AM  Result Value Ref Range   WBC 2.4 (L) 4.0 - 10.5 K/uL   RBC 3.95 3.87 - 5.11 MIL/uL   Hemoglobin 12.1 12.0 - 15.0 g/dL   HCT 37.2 36.0 - 46.0 %   MCV 94.2 78.0 - 100.0 fL   MCH 30.6 26.0 - 34.0 pg   MCHC 32.5 30.0 - 36.0 g/dL   RDW 14.4 11.5 - 15.5 %   Platelets 243 150 - 400 K/uL  Comprehensive metabolic panel     Status: Abnormal   Collection Time: 07/14/15  6:08 AM  Result Value Ref Range   Sodium 140 135 - 145 mmol/L   Potassium 3.8 3.5 - 5.1 mmol/L   Chloride 109 101 - 111 mmol/L   CO2 25 22 - 32 mmol/L   Glucose, Bld 113 (H) 65 - 99 mg/dL   BUN 24 (H) 6 - 20 mg/dL   Creatinine, Ser 0.74 0.44 - 1.00 mg/dL   Calcium 7.4 (L) 8.9 - 10.3 mg/dL   Total Protein 5.8 (L) 6.5 - 8.1 g/dL   Albumin 3.0 (L) 3.5 - 5.0 g/dL   AST 14 (L) 15 - 41 U/L   ALT 17 14 - 54 U/L   Alkaline Phosphatase 63 38 - 126 U/L   Total Bilirubin 0.7 0.3 - 1.2 mg/dL   GFR calc non Af Amer >60 >60 mL/min   GFR calc Af Amer >60 >60 mL/min    Comment: (NOTE) The eGFR has been calculated using the CKD EPI equation. This calculation has not been validated in all clinical situations. eGFR's persistently <60 mL/min signify possible Chronic Kidney Disease.    Anion gap 6 5 - 15  C difficile quick scan w  PCR reflex     Status: None   Collection Time: 07/14/15  4:40 PM  Result Value Ref Range   C Diff antigen NEGATIVE NEGATIVE   C Diff toxin NEGATIVE NEGATIVE   C Diff interpretation Negative for toxigenic C. difficile   CBC with Differential/Platelet     Status: Abnormal   Collection  Time: 07/15/15  6:43 AM  Result Value Ref Range   WBC 5.3 4.0 - 10.5 K/uL   RBC 3.56 (L) 3.87 - 5.11 MIL/uL   Hemoglobin 10.9 (L) 12.0 - 15.0 g/dL   HCT 33.7 (L) 36.0 - 46.0 %   MCV 94.7 78.0 - 100.0 fL   MCH 30.6 26.0 - 34.0 pg   MCHC 32.3 30.0 - 36.0 g/dL   RDW 14.4 11.5 - 15.5 %   Platelets 232 150 - 400 K/uL   Neutrophils Relative % 69 43 - 77 %   Neutro Abs 3.6 1.7 - 7.7 K/uL   Lymphocytes Relative 19 12 - 46 %   Lymphs Abs 1.0 0.7 - 4.0 K/uL   Monocytes Relative 12 3 - 12 %   Monocytes Absolute 0.6 0.1 - 1.0 K/uL   Eosinophils Relative 0 0 - 5 %   Eosinophils Absolute 0.0 0.0 - 0.7 K/uL   Basophils Relative 0 0 - 1 %   Basophils Absolute 0.0 0.0 - 0.1 K/uL   WBC Morphology INCREASED BANDS (>20% BANDS)   Glucose, capillary     Status: Abnormal   Collection Time: 07/15/15  8:01 AM  Result Value Ref Range   Glucose-Capillary 61 (L) 65 - 99 mg/dL    ABGS No results for input(s): PHART, PO2ART, TCO2, HCO3 in the last 72 hours.  Invalid input(s): PCO2 CULTURES Recent Results (from the past 240 hour(s))  C difficile quick scan w PCR reflex     Status: None   Collection Time: 07/14/15  4:40 PM  Result Value Ref Range Status   C Diff antigen NEGATIVE NEGATIVE Final   C Diff toxin NEGATIVE NEGATIVE Final   C Diff interpretation Negative for toxigenic C. difficile  Final   Studies/Results: Dg Chest 2 View  07/13/2015   CLINICAL DATA:  Constant diarrhea and nausea for 2 days. History of gastritis, esophagitis, dysphagia and esophageal dilatation. Initial encounter.  EXAM: CHEST  2 VIEW  COMPARISON:  06/01/2015.  FINDINGS: The heart size and mediastinal contours are stable. There is marked gaseous distention of the esophagus, especially at the thoracic inlet. Appearance is unchanged. Left hemidiaphragm elevation and chronic changes at the left base are stable. The lungs are otherwise clear. There is no pleural effusion. Thoracic spine degenerative changes and segmentation anomaly  are unchanged.  IMPRESSION: No active cardiopulmonary process. Stable marked esophageal dilatation, likely achalasia.   Electronically Signed   By: Richardean Sale M.D.   On: 07/13/2015 17:00   Ct Abdomen Pelvis W Contrast  07/13/2015   CLINICAL DATA:  Diarrhea.  EXAM: CT ABDOMEN AND PELVIS WITH CONTRAST  TECHNIQUE: Multidetector CT imaging of the abdomen and pelvis was performed using the standard protocol following bolus administration of intravenous contrast.  CONTRAST:  57m OMNIPAQUE IOHEXOL 300 MG/ML SOLN, 1034mOMNIPAQUE IOHEXOL 300 MG/ML SOLN  COMPARISON:  None.  FINDINGS: Mild cardiomegaly. Scarring noted in the lung bases. No pleural effusions.  Gallbladder is distended. Small layering stones noted. There is mild intrahepatic and extrahepatic biliary ductal dilatation. Concern for multiple stones within the common bile duct. This is best seen on coronal image 36. No  focal hepatic abnormality. Spleen, pancreas, kidneys are unremarkable except for small benign appearing renal cysts bilaterally. No hydronephrosis.  Nonobstructive bowel gas pattern. Scattered air-fluid levels throughout the large and small bowel. This could reflect gastroenteritis. No free fluid or free air. No adenopathy. Aorta is normal caliber.  Prior hysterectomy. No adnexal masses. Urinary bladder is unremarkable.  No acute bony abnormality or focal bone lesion.  IMPRESSION: Fluid throughout the large and small bowel with scattered air-fluid levels. No evidence of bowel obstruction. Findings could reflect gastroenteritis.  Gallbladder distention with small layering stones. Similar density structures are noted in the dilated common bile duct concerning for common bile duct stones. Intrahepatic and extrahepatic biliary ductal dilatation. If further evaluation is felt warranted, MRCP or ERCP may be beneficial.  Bilateral renal cysts.  Bibasilar scarring.   Electronically Signed   By: Rolm Baptise M.D.   On: 07/13/2015 18:50     Medications:  Prior to Admission:  Prescriptions prior to admission  Medication Sig Dispense Refill Last Dose  . gabapentin (NEURONTIN) 100 MG capsule Take 300 mg by mouth at bedtime.   11 07/12/2015 at Unknown time  . loperamide (IMODIUM) 1 MG/5ML solution Take 2 mg by mouth as needed for diarrhea or loose stools.   07/12/2015 at Unknown time  . meclizine (ANTIVERT) 25 MG tablet Take 25 mg by mouth at bedtime.   07/12/2015 at Unknown time  . ondansetron (ZOFRAN ODT) 4 MG disintegrating tablet 31m ODT q4 hours prn nausea/vomit (Patient taking differently: Take 4 mg by mouth every 4 (four) hours as needed for nausea or vomiting. 445mODT q4 hours prn nausea/vomit) 12 tablet 0 07/13/2015 at Unknown time  . esomeprazole (NEXIUM) 40 MG packet Take 40 mg by mouth daily before breakfast. 30 each 12   . feeding supplement, ENSURE ENLIVE, (ENSURE ENLIVE) LIQD Take 237 mLs by mouth 3 (three) times daily between meals. 237 mL 12    Scheduled: . antiseptic oral rinse  7 mL Mouth Rinse BID  . enoxaparin (LOVENOX) injection  40 mg Subcutaneous Q24H  . gabapentin  200 mg Oral QHS  . meclizine  25 mg Oral QHS   Continuous: . sodium chloride 75 mL/hr at 07/15/15 0146   PRJSE:[id number]*OR** acetaminophen, promethazine  Assesment: She was admitted with diarrhea. Stool specimens are pending. She has pharyngoesophageal dysphagia without a definite cause being found. It appears to be neurologic based on multiple evaluations. Her blood test for myasthenia is normal. I don't think were going to be able to fix her swallowing problem. Active Problems:   Dysphagia, pharyngoesophageal phase   Diarrhea   Protein-calorie malnutrition, severe    Plan: Continue treatments. I will discuss possibility of feeding tube with the GI team. I told her if she gets a feeding tube she may need to do a rehabilitation stay somewhere to get adjusted to tube feedings and learn how to use it but that I think she can do this at  home eventually    LOS: 2 days   HAWKINS,EDWARD L 07/15/2015, 8:37 AM

## 2015-07-16 DIAGNOSIS — R531 Weakness: Secondary | ICD-10-CM | POA: Insufficient documentation

## 2015-07-16 DIAGNOSIS — E43 Unspecified severe protein-calorie malnutrition: Secondary | ICD-10-CM

## 2015-07-16 LAB — GI PATHOGEN PANEL BY PCR, STOOL
C DIFFICILE TOXIN A/B: NOT DETECTED
CAMPYLOBACTER BY PCR: NOT DETECTED
Cryptosporidium by PCR: NOT DETECTED
E COLI (ETEC) LT/ST: NOT DETECTED
E COLI (STEC): NOT DETECTED
E COLI 0157 BY PCR: NOT DETECTED
G lamblia by PCR: NOT DETECTED
Norovirus GI/GII: NOT DETECTED
Rotavirus A by PCR: NOT DETECTED
SALMONELLA BY PCR: NOT DETECTED
SHIGELLA BY PCR: NOT DETECTED

## 2015-07-16 LAB — GLUCOSE, CAPILLARY
GLUCOSE-CAPILLARY: 115 mg/dL — AB (ref 65–99)
Glucose-Capillary: 86 mg/dL (ref 65–99)
Glucose-Capillary: 87 mg/dL (ref 65–99)
Glucose-Capillary: 131 mg/dL — ABNORMAL HIGH (ref 65–99)

## 2015-07-16 NOTE — Care Management Note (Signed)
Case Management Note  Patient Details  Name: Nancy Horn MRN: [id number] Date of Birth: Oct 20, 1925  Expected Discharge Date:                  Expected Discharge Plan:  Home w Home Health Services  In-House Referral:  NA, Clinical Social Work  Discharge planning Services  CM Consult  Post Acute Care Choice:  Resumption of Svcs/PTA Provider Choice offered to:  Patient  DME Arranged:    DME Agency:     HH Arranged:  RN, PT HH Agency:  [facility]  Status of Service:  In process, will continue to follow  Medicare Important Message Given:  Yes-second notification given Date Medicare IM Given:    Medicare IM give by:    Date Additional Medicare IM Given:    Additional Medicare Important Message give by:     If discussed at Long Length of Stay Meetings, dates discussed:    Additional Comments: PT has recommended SNF. Pt is agreeable if she can go to [facility] CSW is aware and working with pt on placment. If [facility] is not option, pt will go home. DC not anticipated over weekend. Discussion about PEG tub continues. Will cont to follow.  Malcolm Metro, RN 07/16/2015, 3:37 PM

## 2015-07-16 NOTE — Progress Notes (Signed)
Physical Therapy Treatment Patient Details Name: PAMELA INTRIERI MRN: [id number] DOB: 12-Apr-1925 Today's Date: 07/16/2015       PT Comments    Pt overall fatigued but compliant with therapy.  Completed supine therex with therapist facilitation for form and speed.  Pt preferred to stay in bed due to incontinence, however sitting in upright position.  Pt able to assist with bed mobility to allow clean up following BM/urination.            Precautions / Restrictions  contact   Mobility  Bed Mobility               General bed mobility comments: too fatigued to try to mobilze out of bed  Transfers                    Ambulation/Gait                 Stairs            Wheelchair Mobility    Modified Rankin (Stroke Patients Only)       Balance                                    Cognition Arousal/Alertness: Awake/alert Behavior During Therapy: WFL for tasks assessed/performed Overall Cognitive Status: Within Functional Limits for tasks assessed                      Exercises General Exercises - Lower Extremity Ankle Circles/Pumps: AROM;Both;10 reps;Supine Quad Sets: AROM;Both;10 reps;Supine Short Arc Quad: AROM;Both;10 reps;Supine Heel Slides: AROM;AAROM;Both;10 reps;Supine Hip ABduction/ADduction: AAROM;Both;10 reps;Supine Straight Leg Raises: AAROM;Supine;Both;10 reps    General Comments        Pertinent Vitals/Pain      Home Living                      Prior Function            PT Goals (current goals can now be found in the care plan section)      Frequency       PT Plan Current plan remains appropriate    Co-evaluation             End of Session   Activity Tolerance: Patient limited by fatigue Patient left: in bed;with call bell/phone within reach;with bed alarm set     Time: 1355-1425 PT Time Calculation (min) (ACUTE ONLY): 30 min  Charges:  $Therapeutic Exercise: 23-37  mins                    Lurena Nida, PTA/CLT [phone]   07/16/2015, 5:39 PM

## 2015-07-16 NOTE — Progress Notes (Signed)
Patient ID: Nancy Horn, female   DOB: Mar 14, 1925, 81 y.o.   MRN: [id number]  [facility] Saul Dorsi A. Gerilyn Pilgrim, MD     [email or web address]          Nancy Horn is an 81 y.o. female.   Assessment/Plan: Chronic dysphagia thought to be due to esophageal dysmotility syndrome. There may be underlying neuropathy causing this although she does not appears to have significant neuropathy. This may be a specific neuropathy to the nerves to the esophagus. She does not have evidence of myopathy or myasthenic syndromes. I think we should try patient on pyridostigmine. We'll start does slow increase it gradually. This may help increase motility of the esophagus. PEG placement soon  Sister and son at bedside. No new complaints.   GENERAL: Thin pleasant female in no acute distress.  HEENT: Supple. Atraumatic normocephalic.   ABDOMEN: soft  EXTREMITIES: No edema   BACK: Normal.  SKIN: Normal by inspection.   MENTAL STATUS: Alert and oriented. Speech, language and cognition are generally intact. Judgment and insight normal.   CRANIAL NERVES: Pupils are equal, round and reactive to light and accommodation; extra ocular movements are full, there is no significant nystagmus; visual fields are full; upper and lower facial muscles are normal in strength and symmetric, there is no flattening of the nasolabial folds; tongue is midline; uvula is midline; shoulder elevation is normal.  MOTOR: Normal tone, bulk and strength in the upper extremities; no pronator drift. Right lower extremity shows normal tone, bulk and strength. She does have mild left leg weakness on hip flexion 4+/5. Dorsiflexion 5. Bulk and tone normal.  COORDINATION: Left finger to nose is normal, right finger to nose is normal, No rest tremor; no intention tremor; no postural tremor; no bradykinesia.  REFLEXES: Deep tendon reflexes are symmetrical and normal. Babinski reflexes are equivocal bilaterally.   SENSATION:  Normal to light touch.   Previous workup including acetylcholine receptor antibody but only binding which was negative. Homocysteine 6.3, vitamin B12 865, CPK 83, TSH 1.9, ANA negative, SPEP negative and anti-striatable antibodies negative.  Brain MRI done a couple weeks ago is reviewed in person. 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 There is moderate global atrophy. There are no lacunar infarcts are cortical infarcts. There is increased perivascular dilated spaces in the basal ganglia bilaterally. No microhemorrhages are appreciated.    Objective: Vital signs in last 24 hours: Temp:  [98.3 F (36.8 C)-98.5 F (36.9 C)] 98.4 F (36.9 C) (09/02 1422) Pulse Rate:  [70-81] 73 (09/02 1422) Resp:  [18-20] 20 (09/02 1422) BP: (119-123)/(44-51) 119/44 mmHg (09/02 1422) SpO2:  [98 %-99 %] 98 % (09/02 1422)  Intake/Output from previous day: 09/01 0701 - 09/02 0700 In: 80 [P.O.:80] Out: -  Intake/Output this shift: Total I/O In: 3153.8 [P.O.:20; I.V.:3133.8] Out: -  Nutritional status: Diet full liquid Room service appropriate?: Yes; Fluid consistency:: Thin Diet NPO time specified   Lab Results: Results for orders placed or performed during the hospital encounter of 07/13/15 (from the past 48 hour(s))  CBC with Differential/Platelet     Status: Abnormal   Collection Time: 07/15/15  6:43 AM  Result Value Ref Range   WBC 5.3 4.0 - 10.5 K/uL   RBC 3.56 (L) 3.87 - 5.11 MIL/uL   Hemoglobin 10.9 (L) 12.0 - 15.0 g/dL   HCT 40.9 (L) 81.1 - 91.4 %   MCV 94.7 78.0 - 100.0 fL   MCH  30.6 26.0 - 34.0 pg   MCHC 32.3 30.0 - 36.0 g/dL   RDW 16.1 09.6 - 04.5 %   Platelets 232 150 - 400 K/uL   Neutrophils Relative % 69 43 - 77 %   Neutro Abs 3.6 1.7 - 7.7 K/uL   Lymphocytes Relative 19 12 - 46 %   Lymphs Abs 1.0 0.7 - 4.0 K/uL   Monocytes Relative 12 3 - 12 %   Monocytes Absolute 0.6 0.1 - 1.0 K/uL     Eosinophils Relative 0 0 - 5 %   Eosinophils Absolute 0.0 0.0 - 0.7 K/uL   Basophils Relative 0 0 - 1 %   Basophils Absolute 0.0 0.0 - 0.1 K/uL   WBC Morphology INCREASED BANDS (>20% BANDS)   Glucose, capillary     Status: Abnormal   Collection Time: 07/15/15  8:01 AM  Result Value Ref Range   Glucose-Capillary 61 (L) 65 - 99 mg/dL  Glucose, capillary     Status: None   Collection Time: 07/15/15  8:39 AM  Result Value Ref Range   Glucose-Capillary 81 65 - 99 mg/dL  Glucose, capillary     Status: None   Collection Time: 07/15/15 12:34 PM  Result Value Ref Range   Glucose-Capillary 69 65 - 99 mg/dL  Glucose, capillary     Status: None   Collection Time: 07/15/15  4:45 PM  Result Value Ref Range   Glucose-Capillary 94 65 - 99 mg/dL  Glucose, capillary     Status: None   Collection Time: 07/15/15 10:39 PM  Result Value Ref Range   Glucose-Capillary 89 65 - 99 mg/dL   Comment 1 Document in Chart   Glucose, capillary     Status: None   Collection Time: 07/16/15  7:46 AM  Result Value Ref Range   Glucose-Capillary 86 65 - 99 mg/dL   Comment 1 Notify RN   Glucose, capillary     Status: None   Collection Time: 07/16/15 11:42 AM  Result Value Ref Range   Glucose-Capillary 87 65 - 99 mg/dL  Glucose, capillary     Status: Abnormal   Collection Time: 07/16/15  4:42 PM  Result Value Ref Range   Glucose-Capillary 115 (H) 65 - 99 mg/dL   Comment 1 Notify RN     Lipid Panel No results for input(s): CHOL, TRIG, HDL, CHOLHDL, VLDL, LDLCALC in the last 72 hours.  Studies/Results:  DR FIELDS NOTE Dysphagia (progressive over the past 6 months), extensively evaluated here locally and Dr. Gwinda Passe at [facility]. EGD in April showed dilated proximal esophagus, questionable mid esophageal web. Barium esophagram May 2016 with diffuse gaseous distention of the esophagus throughout its length, markedly hypotonic and hypomobile thoracic esophagus with poor peristaltic activity. Marked prolonged  retention of contrast material. Some laryngeal penetration and aspiration into the proximal trachea noted. Seen by Dr. Gwinda Passe as recently as 06/23/2015. 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 He referred her to neurology. Serum protein electrophoresis, thyroid test were all unremarkable. ANA screen was negative. CT scan of the chest and abdomen (July 2016 at [facility]) ruled out malignancy. She did have evidence of filling defect in the distal common bile duct on that study as well. He was check and heavy metal screen. She is also had a negative anti-striation antibody, acetylcholine receptor antibody. B12 normal. Seen by speech therapy yesterday with no evidence of significant oral pharyngeal dysphagia.  Medications:  Scheduled Meds: . antiseptic oral rinse  7 mL Mouth Rinse BID  . enoxaparin (LOVENOX) injection  40 mg Subcutaneous Q24H  . gabapentin  200 mg Oral QHS  . meclizine  25 mg Oral QHS  . pyridostigmine  60 mg Oral 3 times per day   Continuous Infusions: . sodium chloride 75 mL/hr at 07/16/15 0521   PRN Meds:.acetaminophen **OR** acetaminophen, promethazine     LOS: 3 days   Rilley Stash A. Gerilyn Pilgrim, M.D.  Diplomate, Biomedical engineer of Psychiatry and Neurology ( Neurology).

## 2015-07-16 NOTE — Progress Notes (Signed)
Subjective: Feels the same today, less tearful. Continued difficulty swallowing, not eating her liquid tray. Handling secretions well, taking small sips of water. Minimal need for suctioning of secretions.No further diarrhea, no further abdominal pain. Seems anxious about the procedure, underscores desire to wait until at least tomorrow for procedure until her sister from [place] can be here (arriving later this afternoon/evening.)  Objective: Vital signs in last 24 hours: Temp:  [98.3 F (36.8 C)-98.9 F (37.2 C)] 98.3 F (36.8 C) (09/02 0525) Pulse Rate:  [65-81] 70 (09/02 0525) Resp:  [16-20] 18 (09/02 0525) BP: (107-123)/(47-51) 121/51 mmHg (09/02 0525) SpO2:  [98 %-99 %] 98 % (09/02 0525) Last BM Date: 07/16/15 General:   Alert and oriented, pleasant Head:  Normocephalic and atraumatic. Eyes:  No icterus, sclera clear. Conjuctiva pink.   Heart:  S1, S2 present, no murmurs noted.  Lungs: Clear to auscultation bilaterally, without wheezing, rales, or rhonchi. Tachypnic but not in distress. Abdomen:  Bowel sounds present, soft, non-tender, non-distended. No HSM or hernias noted. No rebound or guarding. No masses appreciated  Extremities:  Without clubbing or edema. Neurologic:  Alert and  oriented x4;  grossly normal neurologically.  Intake/Output from previous day: 09/01 0701 - 09/02 0700 In: 80 [P.O.:80] Out: -  Intake/Output this shift: Total I/O In: 3153.8 [P.O.:20; I.V.:3133.8] Out: -   Lab Results:  Recent Labs  07/13/15 1618 07/14/15 0608 07/15/15 0643  WBC 2.4* 2.4* 5.3  HGB 13.3 12.1 10.9*  HCT 40.4 37.2 33.7*  PLT 277 243 232   BMET  Recent Labs  07/13/15 1618 07/14/15 0608  NA 138 140  K 4.3 3.8  CL 103 109  CO2 27 25  GLUCOSE 142* 113*  BUN 31* 24*  CREATININE 0.81 0.74  CALCIUM 8.4* 7.4*   LFT  Recent Labs  07/13/15 1618 07/14/15 0608  PROT 6.8 5.8*  ALBUMIN 3.6 3.0*  AST 22 14*  ALT 22 17  ALKPHOS 78 63  BILITOT 0.7 0.7    PT/INR No results for input(s): LABPROT, INR in the last 72 hours. Hepatitis Panel No results for input(s): HEPBSAG, HCVAB, HEPAIGM, HEPBIGM in the last 72 hours.   Studies/Results: No results found.  Assessment: 81 year old female with readmission for recurrent diarrhea. Admitted two weeks ago with diarrhea. C. difficile and stool cultures negative at that time. States her diarrhea resolved but recurred Sunday evening. Describes profuse nonbloody diarrhea. Has been unable to eat. CT did not reveal any wall thickening to the small and large bowel. She had air-fluid levels noted in the colon. Stool studies: C. difficile negative, GI pathogen panel pending.   Dysphagia (progressive over the past 6 months), extensively evaluated here locally and Dr. Gwinda Passe at [facility]. EGD in April showed dilated proximal esophagus, questionable mid esophageal web. Barium esophagram May 2016 with diffuse gaseous distention of the esophagus throughout its length, markedly hypotonic and hypomobile thoracic esophagus with poor peristaltic activity. Marked prolonged retention of contrast material. Some laryngeal penetration and aspiration into the proximal trachea noted. Seen by Dr. Gwinda Passe as recently as 06/23/2015. 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 He referred her to neurology. Serum protein electrophoresis, thyroid test were all unremarkable. ANA screen was negative. CT scan of the chest and abdomen (July 2016 at [facility]) ruled out malignancy. She did have evidence of filling defect in the distal common bile duct on that study as well. He was check  and heavy metal screen. She is also had a negative anti-striation antibody, acetylcholine receptor antibody. B12 normal. Seen by speech therapy yesterday with no evidence of significant oral pharyngeal dysphagia.  Cholelithiasis as well as choledocholithiasis without  biliary symptoms and with normal transaminases.  Continued difficulty swallowing, handling secretions well. Is wanting to proceed with PEG and possible PEJ extension, wants to wait until tomorrow for family to arrive. Diarrhea and abdominal pain resolved currently.     Plan: 1. Continue supportive measures 2. Follow-up on GI path panel 3. Possible PEG tomorrow when family is in town, will preliminarily place her NPO after midnight in case decision is made to proceed with PEG placement at that time. 4. Possible ERCP as outpatient for choledocholithiasis after stomach decompression and improved nutrition 5. Monitor for any further diarrhea    Wynne Dust, AGNP-C Adult & Gerontological Nurse Practitioner [facility]    LOS: 3 days    07/16/2015, 12:26 PM

## 2015-07-16 NOTE — Clinical Social Work Note (Signed)
Clinical Social Work Assessment  Patient Details  Name: Nancy Horn MRN: [id number] Date of Birth: [birth year]  Date of referral:  07/16/15               Reason for consult:  Facility Placement                Permission sought to share information with:    Permission granted to share information::     Name::        Agency::     Relationship::     Contact Information:     Housing/Transportation Living arrangements for the past 2 months:  Single Family Home Source of Information:  Patient Patient Interpreter Needed:  None Criminal Activity/Legal Involvement Pertinent to Current Situation/Hospitalization:  No - Comment as needed Significant Relationships:  Friend, Church, Adult Children, Other Family Members Lives with:  Self Do you feel safe going back to the place where you live?  No Need for family participation in patient care:  Yes (Comment)  Care giving concerns:  Patient was previously very independent.    Social Worker assessment / plan:  CSW met with patient, her friend and church member, Theodis Sato, was at bedside. CSW was advised that patient lives alone and uses a walker.  It was informed that patient has had physical therapy coming to her home twice per week.  Ms. 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  Ms. Truman Hayward stated that patient had a very supportive church family.  Patient also advised that she has a son and two granddaughters.  CSW discussed the PT recommendation for SNF.  Patient advised that the only place she would go to would be [facility]. CSW attempted to provide patient with a SNF list but patient declined the list and advised that she was only interested in [facility]. She and her friend indicated that if [facility] was unavailable patient would go home and Ms. Truman Hayward would assist in her care with home health. CSW faxed clinicals to [facility]. Tami at [facility] advised that the facility did not currently have any beds  but may have beds on 9/6.   CSW advised patient had [facility] did not currently have any beds and that if she was discharged between tomorrow and Monday, she would have to go home with home health.  CSW advised that beds may possibly be available on 9/6 if she was still inpatient. Patient was agreeable to this plan.    Employment status:  Retired Nurse, adult PT Recommendations:  [place] / Referral to community resources:     Patient/Family's Response to care: Patient is agreeable to go to [facility] only, however a bed won't be available until 9/6.  Patient/Family's Understanding of and Emotional Response to Diagnosis, Current Treatment, and Prognosis:  Patient understands her diagnosis, treatment and prognosis.    Emotional Assessment Appearance:  Developmentally appropriate Attitude/Demeanor/Rapport:   (Cooperative) Affect (typically observed):  Calm Orientation:  Oriented to Self, Oriented to Place, Oriented to  Time, Oriented to Situation Alcohol / Substance use:  Not Applicable Psych involvement (Current and /or in the community):  No (Comment)  Discharge Needs  Concerns to be addressed:  Discharge Planning Concerns Readmission within the last 30 days:  No Current discharge risk:  None Barriers to Discharge:  No Barriers Identified   Ihor Gully, LCSW 07/16/2015, 1:34 PM [phone]

## 2015-07-16 NOTE — Progress Notes (Signed)
Patient alert has agreed to have PEG tomorrow morning awaiting her sister's arrival from [place] to night Nancy Horn ZOX:[id number] DOB: Jan 07, 1925 DOA: 07/13/2015 PCP: Fredirick Maudlin, MD             Physical Exam: Blood pressure 121/51, pulse 70, temperature 98.3 F (36.8 C), temperature source Oral, resp. rate 18, weight 107 lb 5.8 oz (48.7 kg), SpO2 98 %. lungs clear to A&P no rales wheeze rhonchi diminished breath sounds in the bases heart regular no murmurs gallops heaves thrills rubs no S3-S4 appreciated abdomen soft nontender bowel sounds normoactive   Investigations:  Recent Results (from the past 240 hour(s))  Urine culture     Status: None (Preliminary result)   Collection Time: 07/13/15  6:59 PM  Result Value Ref Range Status   Specimen Description URINE, CATHETERIZED  Final   Special Requests NONE  Final   Culture   Final    >=100,000 COLONIES/mL ESCHERICHIA COLI >=100,000 COLONIES/mL ENTEROCOCCUS SPECIES Performed at [facility]    Report Status PENDING  Incomplete   Organism ID, Bacteria ESCHERICHIA COLI  Final      Susceptibility   Escherichia coli - MIC*    AMPICILLIN <=2 SENSITIVE Sensitive     CEFAZOLIN <=4 SENSITIVE Sensitive     CEFTRIAXONE <=1 SENSITIVE Sensitive     CIPROFLOXACIN <=0.25 SENSITIVE Sensitive     GENTAMICIN 4 SENSITIVE Sensitive     IMIPENEM <=0.25 SENSITIVE Sensitive     NITROFURANTOIN 32 SENSITIVE Sensitive     TRIMETH/SULFA <=20 SENSITIVE Sensitive     AMPICILLIN/SULBACTAM <=2 SENSITIVE Sensitive     PIP/TAZO <=4 SENSITIVE Sensitive     * >=100,000 COLONIES/mL ESCHERICHIA COLI  C difficile quick scan w PCR reflex     Status: None   Collection Time: 07/14/15  4:40 PM  Result Value Ref Range Status   C Diff antigen NEGATIVE NEGATIVE Final   C Diff toxin NEGATIVE NEGATIVE Final   C Diff interpretation Negative for toxigenic C. difficile  Final     Basic Metabolic Panel:  Recent Labs  81/19/14 1618  07/14/15 0608  NA 138 140  K 4.3 3.8  CL 103 109  CO2 27 25  GLUCOSE 142* 113*  BUN 31* 24*  CREATININE 0.81 0.74  CALCIUM 8.4* 7.4*   Liver Function Tests:  Recent Labs  07/13/15 1618 07/14/15 0608  AST 22 14*  ALT 22 17  ALKPHOS 78 63  BILITOT 0.7 0.7  PROT 6.8 5.8*  ALBUMIN 3.6 3.0*     CBC:  Recent Labs  07/13/15 1618 07/14/15 0608 07/15/15 0643  WBC 2.4* 2.4* 5.3  NEUTROABS 1.4*  --  3.6  HGB 13.3 12.1 10.9*  HCT 40.4 37.2 33.7*  MCV 93.5 94.2 94.7  PLT 277 243 232    No results found.    Medications:   Impression:  Active Problems:   Dysphagia, pharyngoesophageal phase   Diarrhea   Protein-calorie malnutrition, severe   Esophageal dysphagia   Cholelithiasis with choledocholithiasis   Generalized weakness     Plan: PEG tube in a.m. and physical therapy for strengthening ambulation then rehabilitation stay  Consultants: Gastroenterology   Procedures for PEG tube placement in a.m.   Antibiotics:                   Code Status:   Family Communication:    Disposition Plan see plan above  Time spent: 30 minutes   LOS: 3 days   Stafford Riviera M  07/16/2015, 1:26 PM

## 2015-07-16 NOTE — Progress Notes (Signed)
Pt sounds very congested. Upon listening to lung sounds, it sounded wet. O2 sat 91% placed on 2 liters of oxygen. Spoke with Dr.Dondiego who stated to decrease fluids to [facility].

## 2015-07-16 NOTE — Care Management Important Message (Signed)
Important Message  Patient Details  Name: Nancy Horn MRN: [id number] Date of Birth: Mar 17, 1925   Medicare Important Message Given:  Yes-second notification given    Malcolm Metro, RN 07/16/2015, 3:36 PM

## 2015-07-16 NOTE — Clinical Social Work Placement (Signed)
   CLINICAL SOCIAL WORK PLACEMENT  NOTE  Date:  07/16/2015  Patient Details  Name: Nancy Horn MRN: [id number] Date of Birth: 1925-07-10  Clinical Social Work is seeking post-discharge placement for this patient at the Skilled  Nursing Facility level of care (*CSW will initial, date and re-position this form in  chart as items are completed):  Yes   Patient/family provided with [facility] Clinical Social Work Department's list of facilities offering this level of care within the geographic area requested by the patient (or if unable, by the patient's family).  Yes   Patient/family informed of their freedom to choose among providers that offer the needed level of care, that participate in Medicare, Medicaid or managed care program needed by the patient, have an available bed and are willing to accept the patient.  Yes   Patient/family informed of [facility]'s ownership interest in [facility] and [facility], as well as of the fact that they are under no obligation to receive care at these facilities.  PASRR submitted to EDS on 07/16/15     PASRR number received on 07/16/15     Existing PASRR number confirmed on       FL2 transmitted to all facilities in geographic area requested by pt/family on 07/16/15     FL2 transmitted to all facilities within larger geographic area on       Patient informed that his/her managed care company has contracts with or will negotiate with certain facilities, including the following:            Patient/family informed of bed offers received.  Patient chooses bed at       Physician recommends and patient chooses bed at      Patient to be transferred to   on  .  Patient to be transferred to facility by       Patient family notified on   of transfer.  Name of family member notified:        PHYSICIAN       Additional Comment:    _______________________________________________ Annice Needy, LCSW 07/16/2015, 1:28  PM [phone]

## 2015-07-17 DIAGNOSIS — R197 Diarrhea, unspecified: Secondary | ICD-10-CM

## 2015-07-17 DIAGNOSIS — E46 Unspecified protein-calorie malnutrition: Secondary | ICD-10-CM

## 2015-07-17 DIAGNOSIS — R531 Weakness: Secondary | ICD-10-CM

## 2015-07-17 LAB — COMPREHENSIVE METABOLIC PANEL
ALBUMIN: 3.1 g/dL — AB (ref 3.5–5.0)
ALK PHOS: 77 U/L (ref 38–126)
ALT: 15 U/L (ref 14–54)
ANION GAP: 12 (ref 5–15)
AST: 15 U/L (ref 15–41)
BUN: 20 mg/dL (ref 6–20)
CHLORIDE: 116 mmol/L — AB (ref 101–111)
CO2: 22 mmol/L (ref 22–32)
Calcium: 6.5 mg/dL — ABNORMAL LOW (ref 8.9–10.3)
Creatinine, Ser: 0.99 mg/dL (ref 0.44–1.00)
GFR calc non Af Amer: 50 mL/min — ABNORMAL LOW (ref >60)
GFR, EST AFRICAN AMERICAN: 58 mL/min — AB (ref >60)
GLUCOSE: 176 mg/dL — AB (ref 65–99)
POTASSIUM: 3 mmol/L — AB (ref 3.5–5.1)
SODIUM: 150 mmol/L — AB (ref 135–145)
Total Bilirubin: 1.9 mg/dL — ABNORMAL HIGH (ref 0.3–1.2)
Total Protein: 6 g/dL — ABNORMAL LOW (ref 6.5–8.1)

## 2015-07-17 LAB — URINE CULTURE: Culture: >=100000

## 2015-07-17 LAB — GLUCOSE, CAPILLARY
Glucose-Capillary: 155 mg/dL — ABNORMAL HIGH (ref 65–99)
Glucose-Capillary: 109 mg/dL — ABNORMAL HIGH (ref 65–99)

## 2015-07-17 LAB — SURGICAL PCR SCREEN
MRSA, PCR: NEGATIVE
STAPHYLOCOCCUS AUREUS: NEGATIVE

## 2015-07-17 LAB — MAGNESIUM: Magnesium: 1.8 mg/dL (ref 1.7–2.4)

## 2015-07-17 LAB — PREALBUMIN: PREALBUMIN: 14 mg/dL — AB (ref 18–38)

## 2015-07-17 MED ORDER — CIPROFLOXACIN IN D5W 400 MG/200ML IV SOLN
400.0000 mg | Freq: Two times a day (BID) | INTRAVENOUS | Status: DC
  Administered 2015-07-17 – 2015-07-21 (×8): 400 mg via INTRAVENOUS
  Filled 2015-07-17 (×9): qty 200

## 2015-07-17 MED ORDER — CIPROFLOXACIN HCL 250 MG PO TABS: 500.0000 mg | ORAL_TABLET | Freq: Two times a day (BID) | ORAL | Status: DC

## 2015-07-17 MED ORDER — CEFAZOLIN SODIUM-DEXTROSE 2-3 GM-% IV SOLR: 2.0000 g | INTRAVENOUS | Status: DC

## 2015-07-17 MED ORDER — PROMETHAZINE HCL 25 MG/ML IJ SOLN
12.5000 mg | INTRAMUSCULAR | Status: DC | PRN
  Administered 2015-07-17 – 2015-07-19 (×4): 12.5 mg via INTRAVENOUS
  Filled 2015-07-17 (×4): qty 1

## 2015-07-17 MED ORDER — POTASSIUM CHLORIDE 10 MEQ/100ML IV SOLN
10.0000 meq | INTRAVENOUS | Status: AC
  Administered 2015-07-17 (×4): 10 meq via INTRAVENOUS
  Filled 2015-07-17 (×4): qty 100

## 2015-07-17 NOTE — Progress Notes (Signed)
Patient found to have hypokalemia potassium 3.0 today also Escherichia coli and enterococci in the urine will start Cipro 500 every 12 IV today for 5 days a PEG tube tomorrow potassium will be repleted with 4 runs of 10 mEq of KCl now with be met in a.Horn. GI pathogen panel negative KYNNEDI ZWEIG DJS:[id number] DOB: [birth year] DOA: 07/13/2015 PCP: Alonza Bogus, MD             Physical Exam: Blood pressure 147/80, pulse 87, temperature 98.5 F (36.9 C), temperature source Oral, resp. rate 21, weight 107 lb 5.8 oz (48.7 kg), SpO2 92 %. no JVD no carotid bruits no thyromegaly lungs clear to A&P no rales wheeze rhonchi heart regular rhythm no S3 or S4 no heaves thrills rubs abdomen soft nontender bowel sounds normoactive   Investigations:  Recent Results (from the past 240 hour(s))  Urine culture     Status: None   Collection Time: 07/13/15  6:59 PM  Result Value Ref Range Status   Specimen Description URINE, CATHETERIZED  Final   Special Requests NONE  Final   Culture   Final    >=100,000 COLONIES/mL ESCHERICHIA COLI >=100,000 COLONIES/mL ENTEROCOCCUS SPECIES Performed at [facility]    Report Status 07/17/2015 FINAL  Final   Organism ID, Bacteria ESCHERICHIA COLI  Final   Organism ID, Bacteria ENTEROCOCCUS SPECIES  Final      Susceptibility   Escherichia coli - MIC*    AMPICILLIN <=2 SENSITIVE Sensitive     CEFAZOLIN <=4 SENSITIVE Sensitive     CEFTRIAXONE <=1 SENSITIVE Sensitive     CIPROFLOXACIN <=0.25 SENSITIVE Sensitive     GENTAMICIN 4 SENSITIVE Sensitive     IMIPENEM <=0.25 SENSITIVE Sensitive     NITROFURANTOIN 32 SENSITIVE Sensitive     TRIMETH/SULFA <=20 SENSITIVE Sensitive     AMPICILLIN/SULBACTAM <=2 SENSITIVE Sensitive     PIP/TAZO <=4 SENSITIVE Sensitive     * >=100,000 COLONIES/mL ESCHERICHIA COLI   Enterococcus species - MIC*    AMPICILLIN <=2 SENSITIVE Sensitive     LEVOFLOXACIN 0.5 SENSITIVE Sensitive     NITROFURANTOIN <=16 SENSITIVE  Sensitive     VANCOMYCIN 1 SENSITIVE Sensitive     * >=100,000 COLONIES/mL ENTEROCOCCUS SPECIES  C difficile quick scan w PCR reflex     Status: None   Collection Time: 07/14/15  4:40 PM  Result Value Ref Range Status   C Diff antigen NEGATIVE NEGATIVE Final   C Diff toxin NEGATIVE NEGATIVE Final   C Diff interpretation Negative for toxigenic C. difficile  Final  Surgical pcr screen     Status: None   Collection Time: 07/17/15  6:22 AM  Result Value Ref Range Status   MRSA, PCR NEGATIVE NEGATIVE Final   Staphylococcus aureus NEGATIVE NEGATIVE Final    Comment:        The Xpert SA Assay (FDA approved for NASAL specimens in patients over 79 years of age), is one component of a comprehensive surveillance program.  Test performance has been validated by [facility] for patients greater than or equal to 59 year old. It is not intended to diagnose infection nor to guide or monitor treatment.      Basic Metabolic Panel:  Recent Labs  07/17/15 0833  NA 150*  K 3.0*  CL 116*  CO2 22  GLUCOSE 176*  BUN 20  CREATININE 0.99  CALCIUM 6.5*   Liver Function Tests:  Recent Labs  07/17/15 0833  AST 15  ALT 15  ALKPHOS  77  BILITOT 1.9*  PROT 6.0*  ALBUMIN 3.1*     CBC:  Recent Labs  07/15/15 0643  WBC 5.3  NEUTROABS 3.6  HGB 10.9*  HCT 33.7*  MCV 94.7  PLT 232    No results found.    Medications:  Impression: UTI Hypokalemia  Active Problems:   Dysphagia, pharyngoesophageal phase   Diarrhea   Protein-calorie malnutrition, severe   Esophageal dysphagia   Cholelithiasis with choledocholithiasis   Generalized weakness     Plan: Add Cipro 400 mg IV every 12 hours for 5 days to cover enterococci and Escherichia coli in urine. KCl 10 mEq IV 4 to rectify hypokalemia 3.0 check be met in a.Horn. prior to PEG tube placement physical therapy for strengthening and ambulation  Consultants: Gastroenterology    Procedures PEG tube placement in  a.Horn.   Antibiotics: Cipro 400 mg IV every 12 hours for 5 days                  Code Status:  Family Communication:  Spoke with sister from [place] and patient  Disposition Plan see plan above  Time spent: 30 minutes   LOS: 4 days   Nancy Horn   07/17/2015, 12:16 PM

## 2015-07-17 NOTE — Progress Notes (Addendum)
  Subjective: Patient denies abdominal pain and she's not had a bowel movement this morning. She had 3 bowel movements yesterday.   Objective: Blood pressure 147/80, pulse 87, temperature 98.5 F (36.9 C), temperature source Oral, resp. rate 21, weight 107 lb 5.8 oz (48.7 kg), SpO2 92 %. Patient is alert and in no acute distress. Abdomen is full but soft and nontender without organomegaly or masses. No LE edema or clubbing noted.  Labs/studies Results:   Recent Labs  07/15/15 0643  WBC 5.3  HGB 10.9*  HCT 33.7*  PLT 232    BMET   Recent Labs  07/17/15 0833  NA 150*  K 3.0*  CL 116*  CO2 22  GLUCOSE 176*  BUN 20  CREATININE 0.99  CALCIUM 6.5*    LFT   Recent Labs  07/17/15 0833  PROT 6.0*  ALBUMIN 3.1*  AST 15  ALT 15  ALKPHOS 77  BILITOT 1.9*    GI pathogen panel is negative   Assessment:  #1. Esophageal dysphagia impairing quality of her life and resulting in severe malnutrition. Patient is agreeable to proceed with PEG/PEJ. Procedure and risks were reviewed in detail with patient and her 2 sisters and a brother were at bedside. #2. Diarrhea. Stool studies are negative. She may have small bowel bacterial overgrowth if indeed she has small bowel GI motility as well. Will treat symptomatically. #3. Hypokalemia. She is receiving IV KCl. Will check serum magnesium #4. Choledocholithiasis with normal bili and transaminases to be addressed at a later date.  Recommendations:  Check serum magnesium and pre-albumin level. PEG/PEJ in a.m with monitored anesthesia care and fluoroscopy. Metabolic 7 in a.m. Enteric precautions to be discontinued.

## 2015-07-18 LAB — BASIC METABOLIC PANEL
ANION GAP: 9 (ref 5–15)
BUN: 17 mg/dL (ref 6–20)
CHLORIDE: 113 mmol/L — AB (ref 101–111)
CO2: 27 mmol/L (ref 22–32)
Calcium: 6.2 mg/dL — CL (ref 8.9–10.3)
Creatinine, Ser: 0.8 mg/dL (ref 0.44–1.00)
GFR calc Af Amer: >60 mL/min (ref >60)
GFR calc non Af Amer: >60 mL/min (ref >60)
Glucose, Bld: 132 mg/dL — ABNORMAL HIGH (ref 65–99)
POTASSIUM: 2.9 mmol/L — AB (ref 3.5–5.1)
SODIUM: 149 mmol/L — AB (ref 135–145)

## 2015-07-18 LAB — GLUCOSE, CAPILLARY
GLUCOSE-CAPILLARY: 128 mg/dL — AB (ref 65–99)
GLUCOSE-CAPILLARY: 120 mg/dL — AB (ref 65–99)

## 2015-07-18 LAB — POTASSIUM
POTASSIUM: 2.9 mmol/L — AB (ref 3.5–5.1)
Potassium: 2.9 mmol/L — ABNORMAL LOW (ref 3.5–5.1)
Potassium: 3.8 mmol/L (ref 3.5–5.1)

## 2015-07-18 LAB — MAGNESIUM: Magnesium: 1.7 mg/dL (ref 1.7–2.4)

## 2015-07-18 MED ORDER — SODIUM CHLORIDE 0.9 % IV SOLN
2.0000 g | Freq: Once | INTRAVENOUS | Status: AC
  Administered 2015-07-18: 2 g via INTRAVENOUS
  Filled 2015-07-18: qty 20

## 2015-07-18 MED ORDER — POTASSIUM CHLORIDE 10 MEQ/100ML IV SOLN
10.0000 meq | INTRAVENOUS | Status: AC
  Administered 2015-07-18 (×4): 10 meq via INTRAVENOUS
  Filled 2015-07-18 (×3): qty 100

## 2015-07-18 MED ORDER — CALCIUM CARBONATE-VITAMIN D 500-200 MG-UNIT PO TABS
1.0000 | ORAL_TABLET | Freq: Two times a day (BID) | ORAL | Status: DC
  Administered 2015-07-18 (×2): 1 via ORAL
  Filled 2015-07-18 (×2): qty 1

## 2015-07-18 MED ORDER — POTASSIUM CHLORIDE 10 MEQ/100ML IV SOLN
10.0000 meq | INTRAVENOUS | Status: AC
  Administered 2015-07-18 (×2): 10 meq via INTRAVENOUS
  Filled 2015-07-18 (×2): qty 100

## 2015-07-18 MED ORDER — CEFAZOLIN SODIUM-DEXTROSE 2-3 GM-% IV SOLR
2.0000 g | INTRAVENOUS | Status: AC
  Administered 2015-07-19: 2 g via INTRAVENOUS
  Filled 2015-07-18 (×2): qty 50

## 2015-07-18 MED ORDER — POTASSIUM CHLORIDE 10 MEQ/100ML IV SOLN
10.0000 meq | INTRAVENOUS | Status: AC
  Administered 2015-07-18 (×6): 10 meq via INTRAVENOUS
  Filled 2015-07-18 (×2): qty 100

## 2015-07-18 NOTE — Progress Notes (Signed)
Patient's serum potassium remains low even after 4 runs of KCl 10 mEq today and also yesterday. Serum calcium is also low. Procedure canceled. Give her for more runs of KCl today along with calcium gluconate IV and Os-Cal by mouth. Patient's family member informed and procedure rescheduled for tomorrow morning.

## 2015-07-18 NOTE — Progress Notes (Signed)
Patient is receiving runs of KCl for hypokalemia. She also has hypernatremia secondary to dehydration. IV fluid rate increased to 125 ML per hour. PEG/PEJ later today.

## 2015-07-18 NOTE — Progress Notes (Signed)
Spoke with Dr. Karilyn Cota and Dr. Janna Arch. RN to place orders for labs of potassium and magnesium as soon as third run of potassium is complete. Will call results to Dr. Karilyn Cota.

## 2015-07-18 NOTE — Progress Notes (Signed)
Patient had potassium 2.9 this a.m. surprisingly Potassium will replete with 4 more runs prior to PEG tube placement  . She received 4 runs of KCl yesterday afternoon she is not on any diarrhetic and nursing states she has not had much diarrhea to this Nancy Horn XNA:[id number] DOB: 11-29-1924 DOA: 07/13/2015 PCP: Alonza Bogus, MD             Physical Exam: Blood pressure 124/68, pulse 93, temperature 97.5 F (36.4 C), temperature source Oral, resp. rate 21, height _0  (1.651 m), weight 107 lb 5.8 oz (48.7 kg), SpO2 98 %. lungs clear to A&P no rales wheeze rhonchi diminished breath sounds in the bases heart regular rhythm no S3-S4 knee feels rubs abdomen soft nontender bowel sounds normoactive   Investigations:  Recent Results (from the past 240 hour(s))  Urine culture     Status: None   Collection Time: 07/13/15  6:59 PM  Result Value Ref Range Status   Specimen Description URINE, CATHETERIZED  Final   Special Requests NONE  Final   Culture   Final    >=100,000 COLONIES/mL ESCHERICHIA COLI >=100,000 COLONIES/mL ENTEROCOCCUS SPECIES Performed at [facility]    Report Status 07/17/2015 FINAL  Final   Organism ID, Bacteria ESCHERICHIA COLI  Final   Organism ID, Bacteria ENTEROCOCCUS SPECIES  Final      Susceptibility   Escherichia coli - MIC*    AMPICILLIN <=2 SENSITIVE Sensitive     CEFAZOLIN <=4 SENSITIVE Sensitive     CEFTRIAXONE <=1 SENSITIVE Sensitive     CIPROFLOXACIN <=0.25 SENSITIVE Sensitive     GENTAMICIN 4 SENSITIVE Sensitive     IMIPENEM <=0.25 SENSITIVE Sensitive     NITROFURANTOIN 32 SENSITIVE Sensitive     TRIMETH/SULFA <=20 SENSITIVE Sensitive     AMPICILLIN/SULBACTAM <=2 SENSITIVE Sensitive     PIP/TAZO <=4 SENSITIVE Sensitive     * >=100,000 COLONIES/mL ESCHERICHIA COLI   Enterococcus species - MIC*    AMPICILLIN <=2 SENSITIVE Sensitive     LEVOFLOXACIN 0.5 SENSITIVE Sensitive     NITROFURANTOIN <=16 SENSITIVE Sensitive    VANCOMYCIN 1 SENSITIVE Sensitive     * >=100,000 COLONIES/mL ENTEROCOCCUS SPECIES  C difficile quick scan w PCR reflex     Status: None   Collection Time: 07/14/15  4:40 PM  Result Value Ref Range Status   C Diff antigen NEGATIVE NEGATIVE Final   C Diff toxin NEGATIVE NEGATIVE Final   C Diff interpretation Negative for toxigenic C. difficile  Final  Surgical pcr screen     Status: None   Collection Time: 07/17/15  6:22 AM  Result Value Ref Range Status   MRSA, PCR NEGATIVE NEGATIVE Final   Staphylococcus aureus NEGATIVE NEGATIVE Final    Comment:        The Xpert SA Assay (FDA approved for NASAL specimens in patients over 79 years of age), is one component of a comprehensive surveillance program.  Test performance has been validated by [facility] for patients greater than or equal to 12 year old. It is not intended to diagnose infection nor to guide or monitor treatment.      Basic Metabolic Panel:  Recent Labs  07/17/15 0832 07/17/15 0833 07/18/15 0514  NA  --  150* 149*  K  --  3.0* 2.9*  CL  --  116* 113*  CO2  --  22 27  GLUCOSE  --  176* 132*  BUN  --  20 17  CREATININE  --  0.99 0.80  CALCIUM  --  6.5* 6.2*  MG 1.8  --   --    Liver Function Tests:  Recent Labs  07/17/15 0833  AST 15  ALT 15  ALKPHOS 77  BILITOT 1.9*  PROT 6.0*  ALBUMIN 3.1*     CBC: No results for input(s): WBC, NEUTROABS, HGB, HCT, MCV, PLT in the last 72 hours.  No results found.    Medications:   Impression: UTI Active Problems:   Dysphagia, pharyngoesophageal phase   Diarrhea   Protein-calorie malnutrition, severe   Esophageal dysphagia   Cholelithiasis with choledocholithiasis   Generalized weakness     Plan: Continue Cipro 40 mg IV every 12 chickpea met in a.m. PEG tube placement this afternoon after potassium being repleted mobilize patient strengthening and ambulation if possible  Consultants: Gastroenterology   Procedures   Antibiotics: Cipro  IV 400 every 12 and Ancef                  Code Status: Full  Family Communication:    Disposition Plan KCl 10 mEq IV 4 chickpea met in a.m. PEG tube placement this afternoon   Time spent: 30 minutes   LOS: 5 days   Willliam Pettet M   07/18/2015, 11:47 AM

## 2015-07-18 NOTE — Progress Notes (Signed)
CRITICAL VALUE ALERT  Critical value received:   Ca 6.2  Date of notification:  07/18/2015  Critical value read back:  Nurse who received alert:  Harriette Bouillon   MD notified (1st page): Dondiego  Time of first page:  0627  Responding MD: Dondiego  There are no new orders at this time. Will continue to monitor

## 2015-07-19 ENCOUNTER — Inpatient Hospital Stay (HOSPITAL_COMMUNITY): Payer: Medicare Other

## 2015-07-19 ENCOUNTER — Encounter (HOSPITAL_COMMUNITY): Payer: Self-pay | Admitting: Anesthesiology

## 2015-07-19 ENCOUNTER — Encounter (HOSPITAL_COMMUNITY)
Admission: EM | Disposition: A | Discharge status: Skilled Nursing Facility | Payer: Self-pay | Source: Home / Self Care | Attending: Pulmonary Disease

## 2015-07-19 ENCOUNTER — Inpatient Hospital Stay (HOSPITAL_COMMUNITY): Payer: Medicare Other | Admitting: Anesthesiology

## 2015-07-19 HISTORY — PX: ESOPHAGOGASTRODUODENOSCOPY (EGD) WITH PROPOFOL: SHX5813

## 2015-07-19 HISTORY — PX: PEG PLACEMENT: SHX5437

## 2015-07-19 LAB — CBC
HCT: 39.5 % (ref 36.0–46.0)
Hemoglobin: 13.6 g/dL (ref 12.0–15.0)
MCH: 31.9 pg (ref 26.0–34.0)
MCHC: 34.4 g/dL (ref 30.0–36.0)
MCV: 92.7 fL (ref 78.0–100.0)
PLATELETS: 214 10*3/uL (ref 150–400)
RBC: 4.26 MIL/uL (ref 3.87–5.11)
RDW: 16 % — ABNORMAL HIGH (ref 11.5–15.5)
WBC: 14.7 10*3/uL — AB (ref 4.0–10.5)

## 2015-07-19 LAB — BASIC METABOLIC PANEL
ANION GAP: 10 (ref 5–15)
BUN: 13 mg/dL (ref 6–20)
CO2: 27 mmol/L (ref 22–32)
Calcium: 6.7 mg/dL — ABNORMAL LOW (ref 8.9–10.3)
Chloride: 111 mmol/L (ref 101–111)
Creatinine, Ser: 0.63 mg/dL (ref 0.44–1.00)
Glucose, Bld: 95 mg/dL (ref 65–99)
POTASSIUM: 3.8 mmol/L (ref 3.5–5.1)
SODIUM: 148 mmol/L — AB (ref 135–145)

## 2015-07-19 LAB — GLUCOSE, CAPILLARY: GLUCOSE-CAPILLARY: 81 mg/dL (ref 65–99)

## 2015-07-19 LAB — NA AND K (SODIUM & POTASSIUM), RAND UR
POTASSIUM UR: 29 mmol/L
Sodium, Ur: 166 mmol/L

## 2015-07-19 LAB — CHLORIDE, URINE, RANDOM: CHLORIDE URINE: 246 mmol/L

## 2015-07-19 SURGERY — INSERTION, PEG TUBE
Anesthesia: Monitor Anesthesia Care

## 2015-07-19 MED ORDER — MIDAZOLAM HCL 2 MG/2ML IJ SOLN
INTRAMUSCULAR | Status: AC
  Filled 2015-07-19: qty 4

## 2015-07-19 MED ORDER — BACITRACIN-NEOMYCIN-POLYMYXIN 400-5-5000 EX OINT
TOPICAL_OINTMENT | CUTANEOUS | Status: DC | PRN
  Administered 2015-07-19: 1 via TOPICAL

## 2015-07-19 MED ORDER — FENTANYL CITRATE (PF) 100 MCG/2ML IJ SOLN
INTRAMUSCULAR | Status: DC | PRN
  Administered 2015-07-19: 25 ug via INTRAVENOUS

## 2015-07-19 MED ORDER — BUTAMBEN-TETRACAINE-BENZOCAINE 2-2-14 % EX AERO
INHALATION_SPRAY | CUTANEOUS | Status: DC | PRN
  Administered 2015-07-19 (×2): 1 via TOPICAL

## 2015-07-19 MED ORDER — LIDOCAINE HCL (PF) 1 % IJ SOLN
INTRAMUSCULAR | Status: AC
  Filled 2015-07-19: qty 30

## 2015-07-19 MED ORDER — PROPOFOL 10 MG/ML IV BOLUS
INTRAVENOUS | Status: DC | PRN
  Administered 2015-07-19: 10 mg via INTRAVENOUS

## 2015-07-19 MED ORDER — LIDOCAINE HCL (PF) 1 % IJ SOLN
INTRAMUSCULAR | Status: DC | PRN
  Administered 2015-07-19: 2 mL

## 2015-07-19 MED ORDER — FENTANYL CITRATE (PF) 100 MCG/2ML IJ SOLN
INTRAMUSCULAR | Status: AC
  Filled 2015-07-19: qty 4

## 2015-07-19 MED ORDER — JEVITY 1.2 CAL PO LIQD
1000.0000 mL | ORAL | Status: DC
  Administered 2015-07-19: 1000 mL
  Filled 2015-07-19 (×2): qty 1000

## 2015-07-19 MED ORDER — RANITIDINE HCL 150 MG/10ML PO SYRP
150.0000 mg | ORAL_SOLUTION | Freq: Two times a day (BID) | ORAL | Status: DC
  Administered 2015-07-19 – 2015-07-21 (×5): 150 mg
  Filled 2015-07-19 (×9): qty 10

## 2015-07-19 MED ORDER — ONDANSETRON HCL 4 MG/2ML IJ SOLN
INTRAMUSCULAR | Status: DC | PRN
  Administered 2015-07-19: 4 mg via INTRAVENOUS

## 2015-07-19 MED ORDER — PROPOFOL 10 MG/ML IV BOLUS
INTRAVENOUS | Status: AC
  Filled 2015-07-19: qty 20

## 2015-07-19 MED ORDER — PROPOFOL INFUSION 10 MG/ML OPTIME
INTRAVENOUS | Status: DC | PRN
  Administered 2015-07-19: 75 ug/kg/min via INTRAVENOUS

## 2015-07-19 MED ORDER — STERILE WATER FOR IRRIGATION IR SOLN
Status: DC | PRN
  Administered 2015-07-19: 1000 mL

## 2015-07-19 MED ORDER — SODIUM CHLORIDE 0.9 % IV SOLN
INTRAVENOUS | Status: DC
  Administered 2015-07-19: 1000 mL via INTRAVENOUS
  Administered 2015-07-20: 22:00:00 via INTRAVENOUS

## 2015-07-19 MED ORDER — ONDANSETRON HCL 4 MG/2ML IJ SOLN
INTRAMUSCULAR | Status: AC
  Filled 2015-07-19: qty 2

## 2015-07-19 MED ORDER — MIDAZOLAM HCL 5 MG/5ML IJ SOLN
INTRAMUSCULAR | Status: DC | PRN
  Administered 2015-07-19 (×2): 1 mg via INTRAVENOUS

## 2015-07-19 MED ORDER — SODIUM CHLORIDE 0.9 % IV SOLN
INTRAVENOUS | Status: DC
Start: 2015-07-19 — End: 2015-07-19

## 2015-07-19 MED ORDER — LACTATED RINGERS IV SOLN
INTRAVENOUS | Status: DC | PRN
  Administered 2015-07-19: 09:00:00 via INTRAVENOUS

## 2015-07-19 SURGICAL SUPPLY — 17 items
BLOCK BITE 60FR ADLT L/F BLUE (MISCELLANEOUS) ×2 IMPLANT
DECANTER SPIKE VIAL GLASS SM (MISCELLANEOUS) ×2 IMPLANT
DEVICE CLIP HEMOSTAT 235CM (CLIP) ×2 IMPLANT
DRAPE PROXIMA HALF (DRAPES) ×2 IMPLANT
ELECT REM PT RETURN 9FT ADLT (ELECTROSURGICAL) ×3
ELECTRODE REM PT RTRN 9FT ADLT (ELECTROSURGICAL) IMPLANT
ENDOVIVE JEJUNAL FEEDING TUBE 12FR ×2 IMPLANT
FLOOR PAD 36X40 (MISCELLANEOUS) ×3
GLOVE BIOGEL PI IND STRL 7.0 (GLOVE) IMPLANT
GLOVE BIOGEL PI INDICATOR 7.0 (GLOVE) ×2
KIT ENDO PROCEDURE PEN (KITS) ×2 IMPLANT
MANIFOLD NEPTUNE II (INSTRUMENTS) ×2 IMPLANT
PAD FLOOR 36X40 (MISCELLANEOUS) IMPLANT
TOWEL OR 17X26 4PK STRL BLUE (TOWEL DISPOSABLE) ×2 IMPLANT
TUBE ENDOVIVE SAFETY PEG KIT (TUBING) ×2 IMPLANT
TUBING IRRIGATION ENDOGATOR (MISCELLANEOUS) ×2 IMPLANT
WATER STERILE IRR 1000ML POUR (IV SOLUTION) ×2 IMPLANT

## 2015-07-19 NOTE — Anesthesia Preprocedure Evaluation (Addendum)
Anesthesia Evaluation  Patient identified by MRN, date of birth, ID band Patient awake    Reviewed: Allergy & Precautions, NPO status , Patient's Chart, lab work & pertinent test results, reviewed documented beta blocker date and time   Airway Mallampati: III  TM Distance: <3 FB Neck ROM: Full  Mouth opening: Limited Mouth Opening  Dental  (+) Teeth Intact   Pulmonary COPD On O2 at home last 6 weeks breath sounds clear to auscultation        Cardiovascular Exercise Tolerance: Poor Rhythm:Regular Rate:Normal     Neuro/Psych    GI/Hepatic Esophageal dysphagia   Endo/Other    Renal/GU      Musculoskeletal   Abdominal (+)  Abdomen: soft.    Peds  Hematology   Anesthesia Other Findings   Reproductive/Obstetrics                           Anesthesia Physical Anesthesia Plan  ASA: III and emergent  Anesthesia Plan: MAC   Post-op Pain Management:    Induction:   Airway Management Planned: Simple Face Mask  Additional Equipment:   Intra-op Plan:   Post-operative Plan:   Informed Consent: I have reviewed the patients History and Physical, chart, labs and discussed the procedure including the risks, benefits and alternatives for the proposed anesthesia with the patient or authorized representative who has indicated his/her understanding and acceptance.     Plan Discussed with: Surgeon  Anesthesia Plan Comments:         Anesthesia Quick Evaluation

## 2015-07-19 NOTE — Clinical Social Work Note (Signed)
Pt having PEG tube placed this morning. Will follow up in AM regarding d/c planning.  Derenda Fennel, LCSW [phone]

## 2015-07-19 NOTE — Progress Notes (Signed)
This patient's degree of hypokalemia is an enigma at present potassium was 2.936 hours ago she was given 4 runs of KCl IV and it did not replete itself yesterday morning potassium was 2.9 again given for additional runs of KCl repeat potassium 5 hours later was 2.9 subsequently given 5 runs of KCl yesterday afternoon with a potassium of 3.8 at 7 PM I elected to additional 2 runs of KCl last night as insurance and today's potassium is 5.2 I suspect some potassium wasting presumably from the gut she is not on any diarrhetic's perhaps some pretest you what wasting via the renal mechanism consideration given to hyperaldosteronism I will check a urine sodium and potassium ratio to see if it is consistent with this diagnosis consideration given to nephrology consult? Nancy Horn IOX:[id number] DOB: July 29, 1925 DOA: 07/13/2015 PCP: Alonza Bogus, MD             Physical Exam: Blood pressure 130/69, pulse 91, temperature 97.8 F (36.6 C), temperature source Oral, resp. rate 22, height _0  (1.651 m), weight 107 lb 5.8 oz (48.7 kg), SpO2 94 %. lungs clear to A&P no rales wheeze rhonchi heart regular rhythm no S3-S4 no heaves or rubs abdomen soft nontender bowel sounds normoactive patient is alert and oriented   Investigations:  Recent Results (from the past 240 hour(s))  Urine culture     Status: None   Collection Time: 07/13/15  6:59 PM  Result Value Ref Range Status   Specimen Description URINE, CATHETERIZED  Final   Special Requests NONE  Final   Culture   Final    >=100,000 COLONIES/mL ESCHERICHIA COLI >=100,000 COLONIES/mL ENTEROCOCCUS SPECIES Performed at [facility]    Report Status 07/17/2015 FINAL  Final   Organism ID, Bacteria ESCHERICHIA COLI  Final   Organism ID, Bacteria ENTEROCOCCUS SPECIES  Final      Susceptibility   Escherichia coli - MIC*    AMPICILLIN <=2 SENSITIVE Sensitive     CEFAZOLIN <=4 SENSITIVE Sensitive     CEFTRIAXONE <=1 SENSITIVE Sensitive      CIPROFLOXACIN <=0.25 SENSITIVE Sensitive     GENTAMICIN 4 SENSITIVE Sensitive     IMIPENEM <=0.25 SENSITIVE Sensitive     NITROFURANTOIN 32 SENSITIVE Sensitive     TRIMETH/SULFA <=20 SENSITIVE Sensitive     AMPICILLIN/SULBACTAM <=2 SENSITIVE Sensitive     PIP/TAZO <=4 SENSITIVE Sensitive     * >=100,000 COLONIES/mL ESCHERICHIA COLI   Enterococcus species - MIC*    AMPICILLIN <=2 SENSITIVE Sensitive     LEVOFLOXACIN 0.5 SENSITIVE Sensitive     NITROFURANTOIN <=16 SENSITIVE Sensitive     VANCOMYCIN 1 SENSITIVE Sensitive     * >=100,000 COLONIES/mL ENTEROCOCCUS SPECIES  C difficile quick scan w PCR reflex     Status: None   Collection Time: 07/14/15  4:40 PM  Result Value Ref Range Status   C Diff antigen NEGATIVE NEGATIVE Final   C Diff toxin NEGATIVE NEGATIVE Final   C Diff interpretation Negative for toxigenic C. difficile  Final  Surgical pcr screen     Status: None   Collection Time: 07/17/15  6:22 AM  Result Value Ref Range Status   MRSA, PCR NEGATIVE NEGATIVE Final   Staphylococcus aureus NEGATIVE NEGATIVE Final    Comment:        The Xpert SA Assay (FDA approved for NASAL specimens in patients over 91 years of age), is one component of a comprehensive surveillance program.  Test performance has been validated by [facility] for patients greater than or equal to 48 year old. It is not intended to diagnose infection nor to guide or monitor treatment.      Basic Metabolic Panel:  Recent Labs  07/17/15 0832  07/18/15 0514 07/18/15 1157  07/18/15 1855 07/19/15 0610  NA  --   < > 149*  --   --   --  148*  K  --   < > 2.9* 2.9*  < > 3.8 3.8  CL  --   < > 113*  --   --   --  111  CO2  --   < > 27  --   --   --  27  GLUCOSE  --   < > 132*  --   --   --  95  BUN  --   < > 17  --   --   --  13  CREATININE  --   < > 0.80  --   --   --  0.63  CALCIUM  --   < > 6.2*  --   --   --  6.7*  MG 1.8  --   --  1.7  --   --   --   < > = values in this interval not  displayed. Liver Function Tests:  Recent Labs  07/17/15 0833  AST 15  ALT 15  ALKPHOS 77  BILITOT 1.9*  PROT 6.0*  ALBUMIN 3.1*     CBC:  Recent Labs  07/19/15 0615  WBC 14.7*  HGB 13.6  HCT 39.5  MCV 92.7  PLT 214    No results found.    Medications:  Impression: Recurrent hypokalemia  Active Problems:   Dysphagia, pharyngoesophageal phase   Diarrhea   Protein-calorie malnutrition, severe   Esophageal dysphagia   Cholelithiasis with choledocholithiasis   Generalized weakness     Plan: Check B met in a.m. already ordered. for PEG tube placement today begin feedings when GI deems appropriate will check urine sodium and potassium ratios  Consultants: Gastroenterology   Procedures for PEG tube placement today   Antibiotics: Cipro 400 mg IV every 12 for UTI of enterococcus and Escherichia coli                  Code Status:   Family Communication:  Long discussions with many family members  Disposition Plan see plan above  Time spent: 30 minutes   LOS: 6 days   Diante Barley M   07/19/2015, 8:04 AM

## 2015-07-19 NOTE — Transfer of Care (Signed)
Immediate Anesthesia Transfer of Care Note  Patient: Nancy Horn  Procedure(s) Performed: Procedure(s): PERCUTANEOUS ENDOSCOPIC GASTROSTOMY (PEG) PLACEMENT AND JEJUNAL FEEDING TUBE (N/A) ESOPHAGOGASTRODUODENOSCOPY (EGD) WITH PROPOFOL (N/A)  Patient Location: PACU  Anesthesia Type:MAC  Level of Consciousness: awake and alert   Airway & Oxygen Therapy: Patient Spontanous Breathing and Patient connected to face mask oxygen  Post-op Assessment: Report given to RN  Post vital signs: Reviewed and stable  Last Vitals:  Filed Vitals:   07/19/15 0505  BP: 130/69  Pulse: 91  Temp: 36.6 C  Resp: 22    Complications: No apparent anesthesia complications, preoperatively breath sounds diminished but clear, congested cough during procedure.

## 2015-07-19 NOTE — Progress Notes (Signed)
Pt in procedure not available for treatment.  Virgina Organ, PT CLT [phone]

## 2015-07-19 NOTE — Op Note (Addendum)
EGD with PEG and PEJ  PROCEDURE REPORT  PATIENT:  Nancy Horn  MR#:  [id number] Birthdate:  1925/05/30, 81 y.o., female Endoscopist:  Dr. Malissa Hippo, MD Referred By:  Dr. Bonnetta Barry ref. 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 Patient and family has decided to proceed with PEG/PEJ placement for her nutritional needs.            Informed Consent:  The risks, benefits, alternatives & imponderables which include, but are not limited to, bleeding, infection, perforation, drug reaction and potential missed lesion have been reviewed.  The potential for biopsy, lesion removal, esophageal dilation, etc. have also been discussed.  Questions have been answered.  All parties agreeable.  Please see history & physical in medical record for more information.  Medications:  Cetacaine spray topically for oropharyngeal anesthesia Monitored anesthesia care. Please see anesthesia records for details.  Description of procedure:  The endoscope was introduced through the mouth and advanced to the second portion of the duodenum without difficulty or limitations. The mucosal surfaces were surveyed very carefully during advancement of the scope and upon withdrawal.  Findings:  Esophagus:  Esophageal body was dilated with frothy secretions which were suctioned out. Scope passed across GE junction without any difficulty. Stomach:  Stomach was empty and distended very well with insufflation. Folds in the proximal stomach were normal. Examination mucosa gastric body was normal. There was patchy erythema edema to antral mucosa with no ulcer noted. Pyloric channel was patent. Angularis fundus and cardia were unremarkable. Duodenum:  Normal bulbar and post bulbar mucosa.  Therapeutic/Diagnostic Maneuvers Performed:   Site for PEG placement was marked  and epigastric region just to the left of midline with there was good transillumination and one-to-one movement on pushing with the finger. Skin was prepped in the usual fashion with Baird Kay solution and isolated using sterile sheet. Skin part was assisted by Mrs. Nena Polio RN at skin incision was made by me and 14-gauge Seldinger needle was advanced across abdominal wall into gastric lumen by me as well. Needle was removed and guidewire was passed and caught with snare already in place. Endoscope was withdrawn and why was pulled along the way. 24 French gastrostomy tube was advanced over the guidewire to was pulled at G site until inner and was felt to be resting against the mucosa. This too was secured on the outside that placing bolster. Tube was cut to desired length. Slim scope was used for second part of the procedure. I was able to advance the scope into proximal jejunum without any difficulty. 12 [place] jejunal feeding tube was advanced through the gastric tube. Serum at the tip of jejunal feeding tube was caught with 360 clip and scope was advanced into duodenum and beyond that the string broke. Therefore jejunal tube could not be advanced into jejunum but was threaded into second part of the duodenum using the clip to grab it. Endoscope was withdrawn. On the outside site was covered with anti-biotic ointment and sterile dressing applied. Patient tolerated the procedure well.  Fluoroscopy used for the procedure.  Complications:  None  Impression: Dilated esophageal body with pooling of secretions. Antral gastritis. 24 French gastrostomy tube placed percutaneously. Well [place] jejunal feeding tube could only be advanced into second part of duodenum.  Recommendations:  Begin Jevity 1.2 at 25 ML per hour via jejunal/duodenal tube.  Will use gastric port for medications and also check residual every 4 hours. Will obtain KUB in 24 hours to check position of jejunal tube. Decrease IV fluid  rate to 75 ML per hour.  Zenna Traister U  07/19/2015  10:53 AM  CC: Dr. Fredirick Maudlin, MD & Dr. Bonnetta Barry ref. provider found

## 2015-07-19 NOTE — Anesthesia Postprocedure Evaluation (Signed)
  Anesthesia Post-op Note  Patient: Nancy Horn  Procedure(s) Performed: Procedure(s): PERCUTANEOUS ENDOSCOPIC 24FR GASTROSTOMY (PEG) PLACEMENT AND 12FR JEJUNAL FEEDING TUBE (N/A) ESOPHAGOGASTRODUODENOSCOPY (EGD) WITH PROPOFOL (N/A)  Patient Location: PACU  Anesthesia Type:MAC  Level of Consciousness: awake, alert  and oriented  Airway and Oxygen Therapy: Patient Spontanous Breathing  Post-op Pain: none  Post-op Assessment: Post-op Vital signs reviewed, Patient's Cardiovascular Status Stable, Respiratory Function Stable, Patent Airway and No signs of Nausea or vomiting              Post-op Vital Signs: Reviewed and stable  Last Vitals:  Filed Vitals:   07/19/15 1130  BP: 130/68  Pulse: 82  Temp:   Resp: 23    Complications: No apparent anesthesia complications

## 2015-07-20 ENCOUNTER — Inpatient Hospital Stay (HOSPITAL_COMMUNITY): Payer: Medicare Other

## 2015-07-20 LAB — BASIC METABOLIC PANEL
ANION GAP: 4 — AB (ref 5–15)
Anion gap: 5 (ref 5–15)
BUN: 9 mg/dL (ref 6–20)
BUN: 10 mg/dL (ref 6–20)
CALCIUM: 6.2 mg/dL — AB (ref 8.9–10.3)
CO2: 31 mmol/L (ref 22–32)
CO2: 35 mmol/L — AB (ref 22–32)
CREATININE: 0.42 mg/dL — AB (ref 0.44–1.00)
CREATININE: 0.45 mg/dL (ref 0.44–1.00)
Calcium: 5.3 mg/dL — CL (ref 8.9–10.3)
Chloride: 104 mmol/L (ref 101–111)
Chloride: 103 mmol/L (ref 101–111)
GFR calc Af Amer: >60 mL/min (ref >60)
GLUCOSE: 125 mg/dL — AB (ref 65–99)
Glucose, Bld: 140 mg/dL — ABNORMAL HIGH (ref 65–99)
POTASSIUM: 2.7 mmol/L — AB (ref 3.5–5.1)
Potassium: 3.4 mmol/L — ABNORMAL LOW (ref 3.5–5.1)
SODIUM: 142 mmol/L (ref 135–145)
Sodium: 140 mmol/L (ref 135–145)

## 2015-07-20 LAB — GLUCOSE, CAPILLARY: GLUCOSE-CAPILLARY: 151 mg/dL — AB (ref 65–99)

## 2015-07-20 LAB — MAGNESIUM: MAGNESIUM: 1.1 mg/dL — AB (ref 1.7–2.4)

## 2015-07-20 MED ORDER — POTASSIUM CHLORIDE 10 MEQ/100ML IV SOLN
10.0000 meq | INTRAVENOUS | Status: AC
  Administered 2015-07-20 (×4): 10 meq via INTRAVENOUS
  Filled 2015-07-20 (×3): qty 100

## 2015-07-20 MED ORDER — MAGNESIUM SULFATE 2 GM/50ML IV SOLN
2.0000 g | Freq: Once | INTRAVENOUS | Status: AC
  Administered 2015-07-20: 2 g via INTRAVENOUS
  Filled 2015-07-20: qty 50

## 2015-07-20 MED ORDER — JEVITY 1.2 CAL PO LIQD
1000.0000 mL | ORAL | Status: DC
  Administered 2015-07-20: 1000 mL
  Filled 2015-07-20 (×2): qty 1000

## 2015-07-20 MED ORDER — MAGNESIUM SULFATE 2 GM/50ML IV SOLN: 2.0000 g | Freq: Once | INTRAVENOUS | Status: DC

## 2015-07-20 MED ORDER — CALCIUM GLUCONATE 10 % IV SOLN
2.0000 g | Freq: Once | INTRAVENOUS | Status: AC
  Administered 2015-07-20: 2 g via INTRAVENOUS
  Filled 2015-07-20: qty 20

## 2015-07-20 MED ORDER — ALPRAZOLAM 0.5 MG PO TABS
0.5000 mg | ORAL_TABLET | Freq: Three times a day (TID) | ORAL | Status: DC | PRN
  Administered 2015-07-20 – 2015-07-21 (×2): 0.5 mg via ORAL
  Filled 2015-07-20 (×3): qty 1

## 2015-07-20 MED ORDER — JEVITY 1.2 CAL PO LIQD
1000.0000 mL | ORAL | Status: DC
  Administered 2015-07-21: 1000 mL
  Filled 2015-07-20 (×2): qty 1000

## 2015-07-20 MED ORDER — POTASSIUM CHLORIDE CRYS ER 20 MEQ PO TBCR
40.0000 meq | EXTENDED_RELEASE_TABLET | Freq: Two times a day (BID) | ORAL | Status: DC
  Administered 2015-07-20 (×2): 40 meq via ORAL
  Filled 2015-07-20 (×2): qty 2

## 2015-07-20 NOTE — Clinical Social Work Note (Addendum)
CSW left a voicemail message for Keri at [facility] regarding potential bed offer.    Annice Needy, [place] [phone]

## 2015-07-20 NOTE — Anesthesia Postprocedure Evaluation (Signed)
  Anesthesia Post-op Note  Patient: Nancy Horn  Procedure(s) Performed: Procedure(s): PERCUTANEOUS ENDOSCOPIC 24FR GASTROSTOMY (PEG) PLACEMENT AND 12FR JEJUNAL FEEDING TUBE (N/A) ESOPHAGOGASTRODUODENOSCOPY (EGD) WITH PROPOFOL (N/A)  Patient Location: Nursing Unit  Anesthesia Type:MAC  Level of Consciousness: awake, alert  and patient cooperative  Airway and Oxygen Therapy: Patient Spontanous Breathing and Patient connected to nasal cannula oxygen  Post-op Pain: mild  Post-op Assessment: Post-op Vital signs reviewed, Patient's Cardiovascular Status Stable, Respiratory Function Stable and Patent Airway              Post-op Vital Signs: Reviewed and stable  Last Vitals:  Filed Vitals:   07/20/15 0651  BP: 118/61  Pulse: 77  Temp: 36.7 C  Resp: 20    Complications: No apparent anesthesia complications

## 2015-07-20 NOTE — Care Management Note (Signed)
Case Management Note  Patient Details  Name: Nancy Horn MRN: [id number] Date of Birth: [birth year]  Expected Discharge Date:                  Expected Discharge Plan:  Home w Home Health Services  In-House Referral:  NA, Clinical Social Work  Discharge planning Services  CM Consult  Post Acute Care Choice:  Resumption of Svcs/PTA Provider Choice offered to:  Patient  DME Arranged:    DME Agency:     HH Arranged:  RN, PT HH Agency:  [facility]  Status of Service:  In process, will continue to follow  Medicare Important Message Given:  Yes-second notification given Date Medicare IM Given:    Medicare IM give by:    Date Additional Medicare IM Given:    Additional Medicare Important Message give by:     If discussed at Long Length of Stay Meetings, dates discussed: 07/20/2015   Additional Comments: S/p PEG placement on 07/19/2015. Pt is agreeable to SNF if she can go to [facility], CSW is aware. Will cont to follow.  Malcolm Metro, RN 07/20/2015, 3:08 PM

## 2015-07-20 NOTE — Progress Notes (Signed)
  Subjective:  Patient denies abdominal pain. She states she has soreness at G-tube site because she's been coughing. She has been able to swallow sips of clear liquids. Her sister and brother tell me that she was very anxious this morning but has calmed down with alprazolam. She has been worried about who will pay for feeding etc.  She denies shortness of breath or diarrhea.  She is passing flatus.   Objective: Blood pressure 118/61, pulse 77, temperature 98.1 F (36.7 C), temperature source Oral, resp. rate 20, height  (1.651 m), weight 107 lb 5.8 oz (48.7 kg), SpO2 97 %. Patient is alert and in no acute distress. Abdomen is full. Bowel sounds are normal. On palpation abdomen is soft and nontender. Both G port and J port are occluded. I was not able to flush the sports with water. I was able to pass 035 guidewire with resistance at about 35 cm in same is true for brush. No LE edema or clubbing noted.  Labs/studies Results:   Recent Labs  07/19/15 0615  WBC 14.7*  HGB 13.6  HCT 39.5  PLT 214    BMET   Recent Labs  07/18/15 0514  07/18/15 1855 07/19/15 0610 07/20/15 0616  NA 149*  --   --  148* 140  K 2.9*  < > 3.8 3.8 2.7*  CL 113*  --   --  111 104  CO2 27  --   --  27 31  GLUCOSE 132*  --   --  95 125*  BUN 17  --   --  13 9  CREATININE 0.80  --   --  0.63 0.42*  CALCIUM 6.2*  --   --  6.7* 5.3*  < > = values in this interval not displayed.   Portable KUB reveals kink in jejunal tube at the level of fundus. Oral staple is in the duodenum.  Assessment:  #1. Malfunctioning jejunal tube due to a kink. Therefore this too was removed and an will use PEG for feeding purposes and also to give her medications. If she has large gastric residuals will have to convert PEG to PEJ. #2. Hypokalemia. Serum potassium has dropped again. She must have large total deficit. Pre-albumen was 14 three days ago. #3. Mild hypomagnesemia with hypocalcemia. She does not have  carpopedal spasm. Her serum albumen must be very low in which case corrected serum calcium would be much higher than 5.3. She is getting calcium and magnesium supplement as ordered by Dr. Juanetta Gosling. #4. Malnutrition. #5. Choledocholithiasis. Presently without symptoms.  Recommendations:  Feeding tube converted to PEG because of malfunction of jejunal feeding port. Will monitor gastric residuals closely. Feeding rate increased to 35 mL per hour. If residuals remain low but increase to 50 mL later this evening or tomorrow morning. For repeat lab in a.m.

## 2015-07-20 NOTE — Addendum Note (Signed)
Addendum  created 07/20/15 1610 by Franco Nones, CRNA   Modules edited: Charges VN

## 2015-07-20 NOTE — Progress Notes (Signed)
Subjective: She had PEG tube placement yesterday and is tolerating tube feeding so far. Her potassium is low again. She says she's not had much bowel movement. She is very anxious.  Objective: Vital signs in last 24 hours: Temp:  [97.6 F (36.4 C)-98.5 F (36.9 C)] 98.1 F (36.7 C) (09/06 0651) Pulse Rate:  [76-85] 77 (09/06 0651) Resp:  [17-24] 20 (09/06 0651) BP: (88-135)/(50-76) 118/61 mmHg (09/06 0651) SpO2:  [93 %-100 %] 97 % (09/06 0651) Weight change:  Last BM Date: 07/17/15  Intake/Output from previous day: 09/05 0701 - 09/06 0700 In: 1507 [P.O.:240; I.V.:900; NG/GT:367] Out: 1050 [Urine:1050]  PHYSICAL EXAM General appearance: alert, cooperative and mild distress Resp: clear to auscultation bilaterally Cardio: regular rate and rhythm, S1, S2 normal, no murmur, click, rub or gallop GI: soft, non-tender; bowel sounds normal; no masses,  no organomegaly Extremities: extremities normal, atraumatic, no cyanosis or edema  Lab Results:  Results for orders placed or performed during the hospital encounter of 07/13/15 (from the past 48 hour(s))  Glucose, capillary     Status: Abnormal   Collection Time: 07/18/15 11:31 AM  Result Value Ref Range   Glucose-Capillary 120 (H) 65 - 99 mg/dL   Comment 1 Notify RN   Potassium     Status: Abnormal   Collection Time: 07/18/15 11:57 AM  Result Value Ref Range   Potassium 2.9 (L) 3.5 - 5.1 mmol/L  Magnesium     Status: None   Collection Time: 07/18/15 11:57 AM  Result Value Ref Range   Magnesium 1.7 1.7 - 2.4 mg/dL  Potassium     Status: Abnormal   Collection Time: 07/18/15  2:02 PM  Result Value Ref Range   Potassium 2.9 (L) 3.5 - 5.1 mmol/L  Potassium     Status: None   Collection Time: 07/18/15  6:55 PM  Result Value Ref Range   Potassium 3.8 3.5 - 5.1 mmol/L    Comment: DELTA CHECK NOTED  Basic metabolic panel     Status: Abnormal   Collection Time: 07/19/15  6:10 AM  Result Value Ref Range   Sodium 148 (H) 135 - 145  mmol/L   Potassium 3.8 3.5 - 5.1 mmol/L   Chloride 111 101 - 111 mmol/L   CO2 27 22 - 32 mmol/L   Glucose, Bld 95 65 - 99 mg/dL   BUN 13 6 - 20 mg/dL   Creatinine, Ser 0.63 0.44 - 1.00 mg/dL   Calcium 6.7 (L) 8.9 - 10.3 mg/dL   GFR calc non Af Amer >60 >60 mL/min   GFR calc Af Amer >60 >60 mL/min    Comment: (NOTE) The eGFR has been calculated using the CKD EPI equation. This calculation has not been validated in all clinical situations. eGFR's persistently <60 mL/min signify possible Chronic Kidney Disease.    Anion gap 10 5 - 15  CBC     Status: Abnormal   Collection Time: 07/19/15  6:15 AM  Result Value Ref Range   WBC 14.7 (H) 4.0 - 10.5 K/uL   RBC 4.26 3.87 - 5.11 MIL/uL   Hemoglobin 13.6 12.0 - 15.0 g/dL   HCT 39.5 36.0 - 46.0 %   MCV 92.7 78.0 - 100.0 fL   MCH 31.9 26.0 - 34.0 pg   MCHC 34.4 30.0 - 36.0 g/dL   RDW 16.0 (H) 11.5 - 15.5 %   Platelets 214 150 - 400 K/uL  Glucose, capillary     Status: None   Collection Time: 07/19/15  7:59 AM  Result Value Ref Range   Glucose-Capillary 81 65 - 99 mg/dL  Na and K (sodium & potassium), rand urine     Status: None   Collection Time: 07/19/15  4:20 PM  Result Value Ref Range   Sodium, Ur 166 mmol/L   Potassium Urine Timed 29 mmol/L  Chloride, urine, random     Status: None   Collection Time: 07/19/15  4:20 PM  Result Value Ref Range   Chloride Urine 246 mmol/L  Basic metabolic panel     Status: Abnormal   Collection Time: 07/20/15  6:16 AM  Result Value Ref Range   Sodium 140 135 - 145 mmol/L   Potassium 2.7 (LL) 3.5 - 5.1 mmol/L    Comment: CRITICAL RESULT CALLED TO, READ BACK BY AND VERIFIED WITH: THOMAS,C AT 6:45AM ON 07/20/15 BY FESTERMAN,C    Chloride 104 101 - 111 mmol/L   CO2 31 22 - 32 mmol/L   Glucose, Bld 125 (H) 65 - 99 mg/dL   BUN 9 6 - 20 mg/dL   Creatinine, Ser 0.42 (L) 0.44 - 1.00 mg/dL   Calcium 5.3 (LL) 8.9 - 10.3 mg/dL    Comment: CRITICAL RESULT CALLED TO, READ BACK BY AND VERIFIED  WITH: THOMAS,C AT 6:45AM ON 07/20/15 BY FESTERMAN,C    GFR calc non Af Amer >60 >60 mL/min   GFR calc Af Amer >60 >60 mL/min    Comment: (NOTE) The eGFR has been calculated using the CKD EPI equation. This calculation has not been validated in all clinical situations. eGFR's persistently <60 mL/min signify possible Chronic Kidney Disease.    Anion gap 5 5 - 15    ABGS No results for input(s): PHART, PO2ART, TCO2, HCO3 in the last 72 hours.  Invalid input(s): PCO2 CULTURES Recent Results (from the past 240 hour(s))  Urine culture     Status: None   Collection Time: 07/13/15  6:59 PM  Result Value Ref Range Status   Specimen Description URINE, CATHETERIZED  Final   Special Requests NONE  Final   Culture   Final    >=100,000 COLONIES/mL ESCHERICHIA COLI >=100,000 COLONIES/mL ENTEROCOCCUS SPECIES Performed at [facility]    Report Status 07/17/2015 FINAL  Final   Organism ID, Bacteria ESCHERICHIA COLI  Final   Organism ID, Bacteria ENTEROCOCCUS SPECIES  Final      Susceptibility   Escherichia coli - MIC*    AMPICILLIN <=2 SENSITIVE Sensitive     CEFAZOLIN <=4 SENSITIVE Sensitive     CEFTRIAXONE <=1 SENSITIVE Sensitive     CIPROFLOXACIN <=0.25 SENSITIVE Sensitive     GENTAMICIN 4 SENSITIVE Sensitive     IMIPENEM <=0.25 SENSITIVE Sensitive     NITROFURANTOIN 32 SENSITIVE Sensitive     TRIMETH/SULFA <=20 SENSITIVE Sensitive     AMPICILLIN/SULBACTAM <=2 SENSITIVE Sensitive     PIP/TAZO <=4 SENSITIVE Sensitive     * >=100,000 COLONIES/mL ESCHERICHIA COLI   Enterococcus species - MIC*    AMPICILLIN <=2 SENSITIVE Sensitive     LEVOFLOXACIN 0.5 SENSITIVE Sensitive     NITROFURANTOIN <=16 SENSITIVE Sensitive     VANCOMYCIN 1 SENSITIVE Sensitive     * >=100,000 COLONIES/mL ENTEROCOCCUS SPECIES  C difficile quick scan w PCR reflex     Status: None   Collection Time: 07/14/15  4:40 PM  Result Value Ref Range Status   C Diff antigen NEGATIVE NEGATIVE Final   C Diff  toxin NEGATIVE NEGATIVE Final   C Diff interpretation Negative for toxigenic  C. difficile  Final  Surgical pcr screen     Status: None   Collection Time: 07/17/15  6:22 AM  Result Value Ref Range Status   MRSA, PCR NEGATIVE NEGATIVE Final   Staphylococcus aureus NEGATIVE NEGATIVE Final    Comment:        The Xpert SA Assay (FDA approved for NASAL specimens in patients over 82 years of age), is one component of a comprehensive surveillance program.  Test performance has been validated by [facility] for patients greater than or equal to 77 year old. It is not intended to diagnose infection nor to guide or monitor treatment.    Studies/Results: Dg Abd 1 View  07/20/2015   CLINICAL DATA:  Feeding tube placement  EXAM: ABDOMEN - 1 VIEW  COMPARISON:  Fluoroscopic images from feeding tube placement of July 19, 2015  FINDINGS: The feeding tube is in reasonable position with the tip in the region of the pylorus or proximal duodenum. The bowel gas pattern is normal. The left retrocardiac region is dense with obscuration of the hemidiaphragm.  IMPRESSION: Reasonable positioning of the feeding tube. Persistent left lower lobe atelectasis or pneumonia.   Electronically Signed   By: David  Martinique M.D.   On: 07/20/2015 07:37   Dg C-arm 1-60 Min  07/19/2015   CLINICAL DATA:  Status post placement of a gastrojejunal feeding tube.  EXAM: DG C-ARM 61-120 MIN  COMPARISON:  None.  FINDINGS: A single C-arm view of the midline lower abdomen is submitted for interpretation. There is a gastric jejunal feeding tube. The exact position of the tube cannot be determined with this limited field-of-view and lack of injected contrast.  IMPRESSION: Feeding tube in an indeterminate position.   Electronically Signed   By: Claudie Revering M.D.   On: 07/19/2015 10:48    Medications:  Prior to Admission:  Prescriptions prior to admission  Medication Sig Dispense Refill Last Dose  . gabapentin (NEURONTIN) 100 MG capsule  Take 300 mg by mouth at bedtime.   11 07/12/2015 at Unknown time  . loperamide (IMODIUM) 1 MG/5ML solution Take 2 mg by mouth as needed for diarrhea or loose stools.   07/12/2015 at Unknown time  . meclizine (ANTIVERT) 25 MG tablet Take 25 mg by mouth at bedtime.   07/12/2015 at Unknown time  . ondansetron (ZOFRAN ODT) 4 MG disintegrating tablet 44m ODT q4 hours prn nausea/vomit (Patient taking differently: Take 4 mg by mouth every 4 (four) hours as needed for nausea or vomiting. 49mODT q4 hours prn nausea/vomit) 12 tablet 0 07/13/2015 at Unknown time  . esomeprazole (NEXIUM) 40 MG packet Take 40 mg by mouth daily before breakfast. 30 each 12   . feeding supplement, ENSURE ENLIVE, (ENSURE ENLIVE) LIQD Take 237 mLs by mouth 3 (three) times daily between meals. 237 mL 12    Scheduled: . antiseptic oral rinse  7 mL Mouth Rinse BID  . ciprofloxacin  400 mg Intravenous Q12H  . feeding supplement (JEVITY 1.2 CAL)  1,000 mL Per Tube Q24H  . gabapentin  200 mg Oral QHS  . meclizine  25 mg Oral QHS  . potassium chloride  10 mEq Intravenous Q1 Hr x 4  . potassium chloride  40 mEq Oral BID  . pyridostigmine  60 mg Oral 3 times per day  . ranitidine  150 mg Per Tube BID   Continuous: . sodium chloride 75 mL/hr at 07/19/15 1726   PRWUJ:[id number]*OR** acetaminophen, ALPRAZolam, promethazine, promethazine  Assesment: She was  admitted with pharyngeal esophageal dysphagia. She now has PEG tube because she's not able to keep up with her nutritional needs. She has severe protein calorie malnutrition. She has generalized weakness. Her potassium is low and she is going to receive potassium via PEG tube and IV. She is not ready for transfer to skilled care facility yet because of her potassium Active Problems:   Dysphagia, pharyngoesophageal phase   Diarrhea   Protein-calorie malnutrition, severe   Esophageal dysphagia   Cholelithiasis with choledocholithiasis   Generalized weakness    Plan: Replace  potassium and recheck in the morning and this afternoon.    LOS: 7 days   Kalina Morabito L 07/20/2015, 8:32 AM

## 2015-07-20 NOTE — Progress Notes (Signed)
CRITICAL VALUE ALERT  Critical value received:  Potassium 2.7 and calcium 5.3  Date of notification: 07/20/15   Time of notification:  0650  Critical value read back: yes  Nurse who received alert:  L. Junita Push, RN   MD notified (1st page):  Dr. Ouida Sills   Time of first page:  0701  MD notified (2nd page):  Time of second page:  Responding MD:  Dr. Ouida Sills  Time MD responded:  [phone]

## 2015-07-20 NOTE — Progress Notes (Addendum)
Calcium level of 6.2 called to MD, orders given. There has not been any residuals from the peg tube this shift.

## 2015-07-20 NOTE — Progress Notes (Signed)
Patient ID: Nancy Horn, female   DOB: 06-Jul-1925, 81 y.o.   MRN: [id number]  Nancy A. Merlene Laughter, MD     [email or web address]          Nancy Horn is an 81 y.o. female.   Assessment/Plan: Chronic dysphagia thought to be due to esophageal dysmotility syndrome. There may be underlying neuropathy. I will discontinue the pyridostigmine given the increased secretion and the no clear improvement in swallowing. Additionally, she is on tube feeds now.  She reports it that her swallowing is about the same. She seemed to have a lot more energy than previous encounters. She has been started on tube feeds which likely are explained to her increased energy. She also seemed to complain of increasing secretions. This could be due to the pyridostigmine.    GENERAL: Thin pleasant female in no acute distress.  HEENT: Supple. Atraumatic normocephalic.   ABDOMEN: soft  EXTREMITIES: No edema   BACK: Normal.  SKIN: Normal by inspection.   MENTAL STATUS: Alert and oriented. Speech, language and cognition are generally intact. Judgment and insight normal.   CRANIAL NERVES: Pupils are equal, round and reactive to light and accommodation; extra ocular movements are full, there is no significant nystagmus; visual fields are full; upper and lower facial muscles are normal in strength and symmetric, there is no flattening of the nasolabial folds; tongue is midline; uvula is midline; shoulder elevation is normal.  MOTOR: Normal tone, bulk and strength in the upper extremities; no pronator drift. Right lower extremity shows normal tone, bulk and strength. She does have mild left leg weakness on hip flexion 4+/5. Dorsiflexion 5. Bulk and tone normal.  COORDINATION: Left finger to nose is normal, right finger to nose is normal, No rest tremor; no intention tremor; no postural tremor; no bradykinesia.  REFLEXES: Deep tendon reflexes are symmetrical and normal. Babinski reflexes  are equivocal bilaterally.   SENSATION: Normal to light touch.   Previous workup including acetylcholine receptor antibody but only binding which was negative. Homocysteine 6.3, vitamin B12 865, CPK 83, TSH 1.9, ANA negative, SPEP negative and anti-striatable antibodies negative.  Brain MRI done a couple weeks ago is reviewed in person. 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 There is moderate global atrophy. There are no lacunar infarcts are cortical infarcts. There is increased perivascular dilated spaces in the basal ganglia bilaterally. No microhemorrhages are appreciated.    Objective: Vital signs in last 24 hours: Temp:  [97.5 F (36.4 C)-98.7 F (37.1 C)] 97.5 F (36.4 C) (09/06 2034) Pulse Rate:  [77-82] 79 (09/06 2034) Resp:  [20] 20 (09/06 2034) BP: (93-118)/(61-71) 93/69 mmHg (09/06 2034) SpO2:  [97 %-98 %] 98 % (09/06 1310)  Intake/Output from previous day: 09/05 0701 - 09/06 0700 In: 9833 [P.O.:240; I.V.:900; NG/GT:367] Out: 1050 [Urine:1050] Intake/Output this shift:   Nutritional status: Diet NPO time specified Except for: Other (See Comments)   Lab Results: Results for orders placed or performed during the hospital encounter of 07/13/15 (from the past 48 hour(s))  Basic metabolic panel     Status: Abnormal   Collection Time: 07/19/15  6:10 AM  Result Value Ref Range   Sodium 148 (H) 135 - 145 mmol/L   Potassium 3.8 3.5 - 5.1 mmol/L   Chloride 111 101 - 111 mmol/L   CO2 27 22 - 32 mmol/L   Glucose, Bld 95 65 - 99 mg/dL   BUN 13 6 -  20 mg/dL   Creatinine, Ser 0.63 0.44 - 1.00 mg/dL   Calcium 6.7 (L) 8.9 - 10.3 mg/dL   GFR calc non Af Amer >60 >60 mL/min   GFR calc Af Amer >60 >60 mL/min    Comment: (NOTE) The eGFR has been calculated using the CKD EPI equation. This calculation has not been validated in all clinical situations. eGFR's persistently <60 mL/min  signify possible Chronic Kidney Disease.    Anion gap 10 5 - 15  CBC     Status: Abnormal   Collection Time: 07/19/15  6:15 AM  Result Value Ref Range   WBC 14.7 (H) 4.0 - 10.5 K/uL   RBC 4.26 3.87 - 5.11 MIL/uL   Hemoglobin 13.6 12.0 - 15.0 g/dL   HCT 39.5 36.0 - 46.0 %   MCV 92.7 78.0 - 100.0 fL   MCH 31.9 26.0 - 34.0 pg   MCHC 34.4 30.0 - 36.0 g/dL   RDW 16.0 (H) 11.5 - 15.5 %   Platelets 214 150 - 400 K/uL  Glucose, capillary     Status: None   Collection Time: 07/19/15  7:59 AM  Result Value Ref Range   Glucose-Capillary 81 65 - 99 mg/dL  Na and K (sodium & potassium), rand urine     Status: None   Collection Time: 07/19/15  4:20 PM  Result Value Ref Range   Sodium, Ur 166 mmol/L   Potassium Urine Timed 29 mmol/L  Chloride, urine, random     Status: None   Collection Time: 07/19/15  4:20 PM  Result Value Ref Range   Chloride Urine 246 mmol/L  Basic metabolic panel     Status: Abnormal   Collection Time: 07/20/15  6:16 AM  Result Value Ref Range   Sodium 140 135 - 145 mmol/L   Potassium 2.7 (LL) 3.5 - 5.1 mmol/L    Comment: CRITICAL RESULT CALLED TO, READ BACK BY AND VERIFIED WITH: THOMAS,C AT 6:45AM ON 07/20/15 BY FESTERMAN,C    Chloride 104 101 - 111 mmol/L   CO2 31 22 - 32 mmol/L   Glucose, Bld 125 (H) 65 - 99 mg/dL   BUN 9 6 - 20 mg/dL   Creatinine, Ser 0.42 (L) 0.44 - 1.00 mg/dL   Calcium 5.3 (LL) 8.9 - 10.3 mg/dL    Comment: CRITICAL RESULT CALLED TO, READ BACK BY AND VERIFIED WITH: THOMAS,C AT 6:45AM ON 07/20/15 BY FESTERMAN,C    GFR calc non Af Amer >60 >60 mL/min   GFR calc Af Amer >60 >60 mL/min    Comment: (NOTE) The eGFR has been calculated using the CKD EPI equation. This calculation has not been validated in all clinical situations. eGFR's persistently <60 mL/min signify possible Chronic Kidney Disease.    Anion gap 5 5 - 15  Magnesium     Status: Abnormal   Collection Time: 07/20/15  6:16 AM  Result Value Ref Range   Magnesium 1.1 (L) 1.7 -  2.4 mg/dL  Basic metabolic panel     Status: Abnormal   Collection Time: 07/20/15  4:59 PM  Result Value Ref Range   Sodium 142 135 - 145 mmol/L   Potassium 3.4 (L) 3.5 - 5.1 mmol/L    Comment: DELTA CHECK NOTED   Chloride 103 101 - 111 mmol/L   CO2 35 (H) 22 - 32 mmol/L   Glucose, Bld 140 (H) 65 - 99 mg/dL   BUN 10 6 - 20 mg/dL   Creatinine, Ser 0.45 0.44 - 1.00 mg/dL  Calcium 6.2 (LL) 8.9 - 10.3 mg/dL    Comment: CRITICAL RESULT CALLED TO, READ BACK BY AND VERIFIED WITH: DICKERSON,M ON 07/20/15 AT 1730 BY LOY,C    GFR calc non Af Amer >60 >60 mL/min   GFR calc Af Amer >60 >60 mL/min    Comment: (NOTE) The eGFR has been calculated using the CKD EPI equation. This calculation has not been validated in all clinical situations. eGFR's persistently <60 mL/min signify possible Chronic Kidney Disease.    Anion gap 4 (L) 5 - 15  Glucose, capillary     Status: Abnormal   Collection Time: 07/20/15  8:49 PM  Result Value Ref Range   Glucose-Capillary 151 (H) 65 - 99 mg/dL   Comment 1 Notify RN     Lipid Panel No results for input(s): CHOL, TRIG, HDL, CHOLHDL, VLDL, LDLCALC in the last 72 hours.  Studies/Results:  DR FIELDS NOTE Dysphagia (progressive over the past 6 months), extensively evaluated here locally and Dr. Arsenio Loader at [facility]. EGD in April showed dilated proximal esophagus, questionable mid esophageal web. Barium esophagram May 2016 with diffuse gaseous distention of the esophagus throughout its length, markedly hypotonic and hypomobile thoracic esophagus with poor peristaltic activity. Marked prolonged retention of contrast material. Some laryngeal penetration and aspiration into the proximal trachea noted. Seen by Dr. Arsenio Loader as recently as 06/23/2015. 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 He referred her to neurology. Serum protein electrophoresis, thyroid test were all  unremarkable. ANA screen was negative. CT scan of the chest and abdomen (July 2016 at [facility]) ruled out malignancy. She did have evidence of filling defect in the distal common bile duct on that study as well. He was check and heavy metal screen. She is also had a negative anti-striation antibody, acetylcholine receptor antibody. B12 normal. Seen by speech therapy yesterday with no evidence of significant oral pharyngeal dysphagia.   Medications:  Scheduled Meds: . antiseptic oral rinse  7 mL Mouth Rinse BID  . ciprofloxacin  400 mg Intravenous Q12H  . [START ON 07/21/2015] feeding supplement (JEVITY 1.2 CAL)  1,000 mL Per Tube Q24H  . gabapentin  200 mg Oral QHS  . meclizine  25 mg Oral QHS  . potassium chloride  40 mEq Oral BID  . pyridostigmine  60 mg Oral 3 times per day  . ranitidine  150 mg Per Tube BID   Continuous Infusions: . sodium chloride 50 mL/hr at 07/20/15 1444   PRN Meds:.acetaminophen **OR** acetaminophen, ALPRAZolam, promethazine, promethazine     LOS: 7 days   Terria Deschepper A. Merlene Horn, M.D.  Diplomate, Tax adviser of Psychiatry and Neurology ( Neurology).

## 2015-07-20 NOTE — Addendum Note (Signed)
Addendum  created 07/20/15 0856 by Franco Nones, CRNA   Modules edited: Notes Section   Notes Section:  File: [id number]

## 2015-07-20 NOTE — Progress Notes (Signed)
MEDICATION RELATED CONSULT NOTE - INITIAL   Pharmacy Consult for Calcium Replacement Indication: Hypocalcemia  No Known Allergies  Patient Measurements: Height:  (165.1 cm) Weight: 107 lb 5.8 oz (48.7 kg) IBW/kg (Calculated) : 57  Vital Signs: Temp: 98.1 F (36.7 C) (09/06 0651) Temp Source: Oral (09/06 0651) BP: 118/61 mmHg (09/06 0651) Pulse Rate: 77 (09/06 0651) Intake/Output from previous day: 09/05 0701 - 09/06 0700 In: 1507 [P.O.:240; I.V.:900; NG/GT:367] Out: 1050 [Urine:1050] Intake/Output from this shift:    Labs:  Recent Labs  07/18/15 0514 07/18/15 1157 07/19/15 0610 07/19/15 0615 07/20/15 0616  WBC  --   --   --  14.7*  --   HGB  --   --   --  13.6  --   HCT  --   --   --  39.5  --   PLT  --   --   --  214  --   CREATININE 0.80  --  0.63  --  0.42*  MG  --  1.7  --   --  1.1*   CMP     Component Value Date/Time   NA 140 07/20/2015 0616   K 2.7* 07/20/2015 0616   CL 104 07/20/2015 0616   CO2 31 07/20/2015 0616   GLUCOSE 125* 07/20/2015 0616   BUN 9 07/20/2015 0616   CREATININE 0.42* 07/20/2015 0616   CALCIUM 5.3* 07/20/2015 0616   PROT 6.0* 07/17/2015 0833   ALBUMIN 3.1* 07/17/2015 0833   AST 15 07/17/2015 0833   ALT 15 07/17/2015 0833   ALKPHOS 77 07/17/2015 0833   BILITOT 1.9* 07/17/2015 0833   GFRNONAA >60 07/20/2015 0616   GFRAA >60 07/20/2015 0616   Estimated Creatinine Clearance: 38.1 mL/min (by C-G formula based on Cr of 0.42).  Medical History: Past Medical History  Diagnosis Date  . Osteoporosis   . Vertigo   . Neuropathy   . Candida esophagitis APR 2016  . Helicobacter pylori gastritis APR 2016    treated with amox and biaxin  . Dysphagia    Assessment: Pt with low albumin and hypocalcemia.  Corrected Calcium ~ 6.  Dr Juanetta Gosling as pharmacy to replace calcium, MD to replace K+.  Goal of Therapy:  Corrected calcium WNL  Plan:  Calcium Gluconate 2gms IV today x 1 F/U calcium and albumin in am  Margo Aye, Alexy Bringle  A 07/20/2015,9:30 AM

## 2015-07-20 NOTE — Progress Notes (Addendum)
PT Cancellation Note  Patient Details Name: KINDRED HEYING MRN: [id number] DOB: 04-07-1925   Cancelled Treatment:    Reason Eval/Treat Not Completed: Medical issues which prohibited therapy.  K+ level 2.7, no PT done.  Pt feels a bit better, now with a PEG tube and receiving feedings.   Myrlene Broker L  PT 07/20/2015, 3:17 PM [phone]

## 2015-07-21 ENCOUNTER — Inpatient Hospital Stay
Admission: RE | Admit: 2015-07-21 | Discharge: 2015-08-12 | Disposition: A | Discharge status: Home / Self Care | Payer: Medicare Other | Source: Ambulatory Visit | Attending: Pulmonary Disease | Admitting: Pulmonary Disease

## 2015-07-21 ENCOUNTER — Encounter (HOSPITAL_COMMUNITY): Payer: Self-pay | Admitting: Internal Medicine

## 2015-07-21 LAB — COMPREHENSIVE METABOLIC PANEL
ALBUMIN: 2.4 g/dL — AB (ref 3.5–5.0)
ALK PHOS: 84 U/L (ref 38–126)
ALT: 20 U/L (ref 14–54)
ANION GAP: 4 — AB (ref 5–15)
AST: 25 U/L (ref 15–41)
BUN: 12 mg/dL (ref 6–20)
CALCIUM: 7.1 mg/dL — AB (ref 8.9–10.3)
CO2: 37 mmol/L — AB (ref 22–32)
Chloride: 104 mmol/L (ref 101–111)
Creatinine, Ser: 0.5 mg/dL (ref 0.44–1.00)
GFR calc Af Amer: >60 mL/min (ref >60)
GFR calc non Af Amer: >60 mL/min (ref >60)
GLUCOSE: 133 mg/dL — AB (ref 65–99)
POTASSIUM: 3.9 mmol/L (ref 3.5–5.1)
SODIUM: 145 mmol/L (ref 135–145)
Total Bilirubin: 0.3 mg/dL (ref 0.3–1.2)
Total Protein: 4.8 g/dL — ABNORMAL LOW (ref 6.5–8.1)

## 2015-07-21 LAB — MAGNESIUM: Magnesium: 1.8 mg/dL (ref 1.7–2.4)

## 2015-07-21 MED ORDER — PROMETHAZINE HCL 12.5 MG PO TABS: 12.5000 mg | ORAL_TABLET | Freq: Four times a day (QID) | ORAL | Status: DC | PRN

## 2015-07-21 MED ORDER — MAGNESIUM 30 MG PO TABS: 30.0000 mg | ORAL_TABLET | Freq: Two times a day (BID) | ORAL | Status: DC

## 2015-07-21 MED ORDER — ALPRAZOLAM 0.5 MG PO TABS: 0.5000 mg | ORAL_TABLET | Freq: Three times a day (TID) | ORAL | Status: AC | PRN

## 2015-07-21 MED ORDER — POTASSIUM CHLORIDE CRYS ER 20 MEQ PO TBCR: 40.0000 meq | EXTENDED_RELEASE_TABLET | Freq: Two times a day (BID) | ORAL | Status: DC

## 2015-07-21 MED ORDER — ACETAMINOPHEN 325 MG PO TABS: 650.0000 mg | ORAL_TABLET | Freq: Four times a day (QID) | ORAL | Status: DC | PRN

## 2015-07-21 MED ORDER — RANITIDINE HCL 150 MG/10ML PO SYRP: 150.0000 mg | ORAL_SOLUTION | Freq: Two times a day (BID) | ORAL | Status: DC

## 2015-07-21 MED ORDER — JEVITY 1.2 CAL PO LIQD: 1000.0000 mL | ORAL | Status: AC

## 2015-07-21 MED ORDER — POTASSIUM CHLORIDE 20 MEQ/15ML (10%) PO SOLN
40.0000 meq | Freq: Two times a day (BID) | ORAL | Status: DC
  Administered 2015-07-21: 40 meq
  Filled 2015-07-21: qty 30

## 2015-07-21 MED ORDER — CALCIUM CARBONATE 1250 MG/5ML PO SUSP
500.0000 mg | Freq: Two times a day (BID) | ORAL | Status: DC
  Administered 2015-07-21: 500 mg
  Filled 2015-07-21 (×5): qty 5

## 2015-07-21 MED ORDER — CALCIUM CARBONATE 1250 MG/5ML PO SUSP: 500.0000 mg | Freq: Two times a day (BID) | ORAL | Status: AC

## 2015-07-21 MED ORDER — ONDANSETRON 4 MG PO TBDP: 4.0000 mg | ORAL_TABLET | ORAL | Status: DC | PRN

## 2015-07-21 NOTE — Progress Notes (Signed)
MEDICATION RELATED CONSULT NOTE  Pharmacy Consult for Calcium Replacement Indication: Hypocalcemia  No Known Allergies  Patient Measurements: Height:  (165.1 cm) Weight: 107 lb 5.8 oz (48.7 kg) IBW/kg (Calculated) : 57  Vital Signs: Temp: 97.9 F (36.6 C) (09/07 0544) Temp Source: Oral (09/07 0544) BP: 124/62 mmHg (09/07 0544) Pulse Rate: 71 (09/07 0544) Intake/Output from previous day: 09/06 0701 - 09/07 0700 In: 1430 [I.V.:600; NG/GT:630; IV Piggyback:200] Out: 1105 [Urine:1100; Drains:5] Intake/Output from this shift:    Labs:  Recent Labs  07/18/15 1157  07/19/15 0615 07/20/15 0616 07/20/15 1659 07/21/15 0608  WBC  --   --  14.7*  --   --   --   HGB  --   --  13.6  --   --   --   HCT  --   --  39.5  --   --   --   PLT  --   --  214  --   --   --   CREATININE  --   < >  --  0.42* 0.45 0.50  MG 1.7  --   --  1.1*  --  1.8  ALBUMIN  --   --   --   --   --  2.4*  PROT  --   --   --   --   --  4.8*  AST  --   --   --   --   --  25  ALT  --   --   --   --   --  20  ALKPHOS  --   --   --   --   --  84  BILITOT  --   --   --   --   --  0.3  < > = values in this interval not displayed. CMP     Component Value Date/Time   NA 145 07/21/2015 0608   K 3.9 07/21/2015 0608   CL 104 07/21/2015 0608   CO2 37* 07/21/2015 0608   GLUCOSE 133* 07/21/2015 0608   BUN 12 07/21/2015 0608   CREATININE 0.50 07/21/2015 0608   CALCIUM 7.1* 07/21/2015 0608   PROT 4.8* 07/21/2015 0608   ALBUMIN 2.4* 07/21/2015 0608   AST 25 07/21/2015 0608   ALT 20 07/21/2015 0608   ALKPHOS 84 07/21/2015 0608   BILITOT 0.3 07/21/2015 0608   GFRNONAA >60 07/21/2015 0608   GFRAA >60 07/21/2015 0608   Estimated Creatinine Clearance: 38.1 mL/min (by C-G formula based on Cr of 0.5).  Medical History: Past Medical History  Diagnosis Date  . Osteoporosis   . Vertigo   . Neuropathy   . Candida esophagitis APR 2016  . Helicobacter pylori gastritis APR 2016    treated with amox and biaxin   . Dysphagia    Assessment: Pt with low albumin and hypocalcemia.  Corrected calcium today is 8.38  Goal of Therapy:  Corrected calcium WNL  Plan:  Will start oral calcium today, 500 mg per tube bid. F/U calcium level in a few days  Talbert Cage Poteet 07/21/2015,8:13 AM

## 2015-07-21 NOTE — Clinical Social Work Placement (Signed)
   CLINICAL SOCIAL WORK PLACEMENT  NOTE  Date:  07/21/2015  Patient Details  Name: Nancy Horn MRN: [id number] Date of Birth: 11-Nov-1925  Clinical Social Work is seeking post-discharge placement for this patient at the Skilled  Nursing Facility level of care (*CSW will initial, date and re-position this form in  chart as items are completed):  Yes   Patient/family provided with [facility] Clinical Social Work Department's list of facilities offering this level of care within the geographic area requested by the patient (or if unable, by the patient's family).  Yes   Patient/family informed of their freedom to choose among providers that offer the needed level of care, that participate in Medicare, Medicaid or managed care program needed by the patient, have an available bed and are willing to accept the patient.  Yes   Patient/family informed of [facility]'s ownership interest in [facility] and [facility], as well as of the fact that they are under no obligation to receive care at these facilities.  PASRR submitted to EDS on 07/16/15     PASRR number received on 07/16/15     Existing PASRR number confirmed on       FL2 transmitted to all facilities in geographic area requested by pt/family on 07/16/15     FL2 transmitted to all facilities within larger geographic area on       Patient informed that his/her managed care company has contracts with or will negotiate with certain facilities, including the following:        Yes   Patient/family informed of bed offers received.  Patient chooses bed at [facility]     Physician recommends and patient chooses bed at      Patient to be transferred to [facility] on 07/21/15.  Patient to be transferred to facility by  [facility] Staff)     Patient family notified on 07/21/15 of transfer.  Name of family member notified:  Sister, Harriett Sine.      PHYSICIAN       Additional Comment:     _______________________________________________ Annice Needy, LCSW 07/21/2015, 10:39 AM  [phone]

## 2015-07-21 NOTE — Progress Notes (Signed)
Report called to [facility] nurse [facility] LPN

## 2015-07-21 NOTE — Plan of Care (Signed)
Problem: Discharge Progression Outcomes Goal: Tolerating diet Outcome: Completed/Met Date Met:  07/21/15 Jevity 1.2 per peg Goal: Other Discharge Outcomes/Goals Outcome: Completed/Met Date Met:  07/21/15 Discharged to [facility]

## 2015-07-21 NOTE — Progress Notes (Addendum)
Nutrition Follow-up  DOCUMENTATION CODES:  Severe malnutrition in context of acute illness/injury, Underweight  INTERVENTION:  Agree with Jevity 1.2 to 50 ml/hr via PEG  Tube feeding regimen provides 1440 kcal (100% of needs), 67 grams of protein, and 968 ml of H2O.   Discussed bolus vs continuous feeding, how to know if she is tolerating the TF, normal bowel movement consistency, types of formulas, care at the SNF   NUTRITION DIAGNOSIS:  Inadequate oral intake related to nausea (diarrhea) as evidenced by energy intake < or equal to 50% for > or equal to 5 days, percent weight loss.  GOAL:  Patient will meet greater than or equal to 90% of their needs  MONITOR:  PO intake, Diet advancement, Weight trends, I & O's, Labs (Work up)  REASON FOR ASSESSMENT:  Malnutrition Screening Tool    ASSESSMENT:  81 year old female PMHx Osteoporosis, Vertigo, Neuropathy, Gastritis, Dysphagia. presents to the hospital with diarrhea x 3 days. Also with poor PO intake; has BM every time eats. CT shows possible gastroenteritis. Was recently discharged from the hospital on 06/29/2015 for diarrhea/dysphagia/dehydration   Interval Hx: Found to have severe esophageal motility disorder s/p PEG/PEJ placement and initiation of TF. D/C today to SNF  Stopped by to see how patient was tolerating TF. TF raised to goal yesterday sometime. She reports some abdominal pain where the tube is placed. She has had no n/v. Abdomen soft, non distended. She has had flatus, but not had a bowel movement since the TF was started ~2 days ago.   She is now 100% PEG dependant. She can only have very small sips of water. She suctions her secretions. All nutrition to be met through TF.   Discussed how her normal bowel movements from now on will be loose as opposed to formed. We discussed different types of formulas.  The formula she is on now is fiber free and may be associated with looser stools. Pt has had an issue with  constipation and she looking forward to having loose stools so she doesn't have to strain. Clarified that her stools should not be watery, just loose.  Family curious to know about assessing tolerance. Discussed how residuals are not the most accurate  for truly assessing tolerance and it is better to look at symptoms such as N/V/D or abdominal distention.   She originally had J access, but due to complications is now gastric access only. Discussed bolus vs enteral feeds and the pros/Cons of both. She was interested in whatever insurance would pay for.   Pt is anxious about SNF care. Answered questions about [facility] and the care she would receive there  Diet Order:  Diet NPO time specified Except for: Other (See Comments)  Skin:  New PEG/PEJ, Incision on abdomen   Last BM:  9/3- watery  Height:  Ht Readings from Last 1 Encounters:  07/13/15 $RemoveB'5\' 5"'RiLBIbDA$  (1.651 m)   Weight:  Wt Readings from Last 1 Encounters:  07/13/15 107 lb 5.8 oz (48.7 kg)   Wt Readings from Last 10 Encounters:  07/13/15 107 lb 5.8 oz (48.7 kg)  06/29/15 115 lb 11.2 oz (52.481 kg)  06/24/15 109 lb (49.442 kg)  02/19/15 124 lb (56.246 kg)  02/01/15 124 lb (56.246 kg)  01/27/15 124 lb (56.246 kg)   Ideal Body Weight:  56.82 kg  BMI:  Body mass index is 17.87 kg/(m^2).  Estimated Nutritional Needs:  Kcal:  1400-1500 (29-31 kcal/kg) Protein:  57-68 (1-1.2 g/kg IBW) Fluid:  1.4-1.5 liters +  enough to replace stool losses  EDUCATION NEEDS:  No education needs identified at this time  Burtis Junes RD, LDN Nutrition Pager: [phone] 07/21/2015 9:41 AM

## 2015-07-21 NOTE — Care Management Important Message (Signed)
Important Message  Patient Details  Name: Nancy Horn MRN: [id number] Date of Birth: Nov 20, 1924   Medicare Important Message Given:  Yes-second notification given    Malcolm Metro, RN 07/21/2015, 10:25 AM

## 2015-07-21 NOTE — Progress Notes (Signed)
She feels better. She's tolerating tube feedings so far. Her magnesium and potassium and calcium were better.  Exam shows she is awake and alert. She has minimal abdominal tenderness and bowel sounds are present and active. She still has a nasal tone to her voice. Her chest is clear. Her heart is regular.  My assessment is that she has problems with nutrition with severe malnutrition she is starting on tube feedings and she'll need to maintain that probably for the duration of her life. I think she is going to need skilled care facility placement at least short-term to get adjusted with tube feedings some rehabilitation because of her weakness. We do not have a definite diagnosis.  Continue treatments. Recheck electrolytes

## 2015-07-21 NOTE — Discharge Summary (Signed)
Physician Discharge Summary  Patient ID: Nancy Horn MRN: [id number] DOB/AGE: [birth year] 81 y.o. Primary Care Physician:Caspar Favila L, MD Admit date: 07/13/2015 Discharge date: 07/21/2015    Discharge Diagnoses:   Active Problems:   Dysphagia, pharyngoesophageal phase   Diarrhea   Protein-calorie malnutrition, severe   Esophageal dysphagia   Cholelithiasis with choledocholithiasis   Generalized weakness  hypokalemia Hypocalcemia Hypomagnesemia    Medication List    TAKE these medications        acetaminophen 325 MG tablet  Commonly known as:  TYLENOL  Take 2 tablets (650 mg total) by mouth every 6 (six) hours as needed for mild pain (or Fever >/= 101).     ALPRAZolam 0.5 MG tablet  Commonly known as:  XANAX  Take 1 tablet (0.5 mg total) by mouth 3 (three) times daily as needed for anxiety.     calcium carbonate (dosed in mg elemental calcium) 1250 (500 CA) MG/5ML  Place 5 mLs (500 mg of elemental calcium total) into feeding tube 2 (two) times daily with a meal.     esomeprazole 40 MG packet  Commonly known as:  NEXIUM  Take 40 mg by mouth daily before breakfast.     feeding supplement (JEVITY 1.2 CAL) Liqd  Place 1,000 mLs into feeding tube daily.     gabapentin 100 MG capsule  Commonly known as:  NEURONTIN  Take 300 mg by mouth at bedtime.     loperamide 1 MG/5ML solution  Commonly known as:  IMODIUM  Take 2 mg by mouth as needed for diarrhea or loose stools.     magnesium 30 MG tablet  Take 1 tablet (30 mg total) by mouth 2 (two) times daily.     meclizine 25 MG tablet  Commonly known as:  ANTIVERT  Take 25 mg by mouth at bedtime.     ondansetron 4 MG disintegrating tablet  Commonly known as:  ZOFRAN ODT  Take 1 tablet (4 mg total) by mouth every 4 (four) hours as needed for nausea or vomiting. 4mg  ODT q4 hours prn nausea/vomit     potassium chloride SA 20 MEQ tablet  Commonly known as:  K-DUR,KLOR-CON  Take 2 tablets (40 mEq total) by mouth  2 (two) times daily.     promethazine 12.5 MG tablet  Commonly known as:  PHENERGAN  Take 1 tablet (12.5 mg total) by mouth every 6 (six) hours as needed for nausea or vomiting.     ranitidine 150 MG/10ML syrup  Commonly known as:  ZANTAC  Place 10 mLs (150 mg total) into feeding tube 2 (two) times daily.        Discharged Condition: Improved    Consults: Gastroenterology/neurology  Significant Diagnostic Studies: Dg Chest 2 View  07/13/2015   CLINICAL DATA:  Constant diarrhea and nausea for 2 days. History of gastritis, esophagitis, dysphagia and esophageal dilatation. Initial encounter.  EXAM: CHEST  2 VIEW  COMPARISON:  06/01/2015.  FINDINGS: The heart size and mediastinal contours are stable. There is marked gaseous distention of the esophagus, especially at the thoracic inlet. Appearance is unchanged. Left hemidiaphragm elevation and chronic changes at the left base are stable. The lungs are otherwise clear. There is no pleural effusion. Thoracic spine degenerative changes and segmentation anomaly are unchanged.  IMPRESSION: No active cardiopulmonary process. Stable marked esophageal dilatation, likely achalasia.   Electronically Signed   By: Carey Bullocks M.D.   On: 07/13/2015 17:00   Dg Abd 1 View  07/20/2015   CLINICAL DATA:  Feeding tube placement  EXAM: ABDOMEN - 1 VIEW  COMPARISON:  Fluoroscopic images from feeding tube placement of July 19, 2015  FINDINGS: The feeding tube is in reasonable position with the tip in the region of the pylorus or proximal duodenum. The bowel gas pattern is normal. The left retrocardiac region is dense with obscuration of the hemidiaphragm.  IMPRESSION: Reasonable positioning of the feeding tube. Persistent left lower lobe atelectasis or pneumonia.   Electronically Signed   By: David  [place] M.D.   On: 07/20/2015 07:37   Mr Brain Wo Contrast  06/28/2015   CLINICAL DATA:  Vertigo for 3 days with weakness.  Kyphosis.  EXAM: MRI HEAD WITHOUT  CONTRAST  TECHNIQUE: Multiplanar, multiecho pulse sequences of the brain and surrounding structures were obtained without intravenous contrast.  COMPARISON:  CT head 07/15/2008.  FINDINGS: No evidence for acute infarction, hemorrhage, mass lesion, hydrocephalus, or extra-axial fluid. Generalized cerebral and cerebellar atrophy. Moderately advanced T2 and FLAIR hyperintensities throughout the periventricular and subcortical white matter suggesting small vessel disease. Prominent perivascular spaces likely secondary to hypertension. No foci of chronic hemorrhage. Flow voids are preserved. Pituitary, pineal, and cerebellar tonsils unremarkable. No upper cervical lesions. BILATERAL cataract extraction. No sinus air-fluid level. Negative mastoids. Extracranial soft tissues unremarkable.  IMPRESSION: Chronic changes as described. No acute intracranial findings are observed.   Electronically Signed   By: Elsie Stain M.D.   On: 06/28/2015 18:35   Ct Abdomen Pelvis W Contrast  07/13/2015   CLINICAL DATA:  Diarrhea.  EXAM: CT ABDOMEN AND PELVIS WITH CONTRAST  TECHNIQUE: Multidetector CT imaging of the abdomen and pelvis was performed using the standard protocol following bolus administration of intravenous contrast.  CONTRAST:  50mL OMNIPAQUE IOHEXOL 300 MG/ML SOLN, OMNIPAQUE IOHEXOL 300 MG/ML SOLN  COMPARISON:  None.  FINDINGS: Mild cardiomegaly. Scarring noted in the lung bases. No pleural effusions.  Gallbladder is distended. Small layering stones noted. There is mild intrahepatic and extrahepatic biliary ductal dilatation. Concern for multiple stones within the common bile duct. This is best seen on coronal image 36. No focal hepatic abnormality. Spleen, pancreas, kidneys are unremarkable except for small benign appearing renal cysts bilaterally. No hydronephrosis.  Nonobstructive bowel gas pattern. Scattered air-fluid levels throughout the large and small bowel. This could reflect gastroenteritis. No free  fluid or free air. No adenopathy. Aorta is normal caliber.  Prior hysterectomy. No adnexal masses. Urinary bladder is unremarkable.  No acute bony abnormality or focal bone lesion.  IMPRESSION: Fluid throughout the large and small bowel with scattered air-fluid levels. No evidence of bowel obstruction. Findings could reflect gastroenteritis.  Gallbladder distention with small layering stones. Similar density structures are noted in the dilated common bile duct concerning for common bile duct stones. Intrahepatic and extrahepatic biliary ductal dilatation. If further evaluation is felt warranted, MRCP or ERCP may be beneficial.  Bilateral renal cysts.  Bibasilar scarring.   Electronically Signed   By: Charlett Nose M.D.   On: 07/13/2015 18:50   Dg C-arm 1-60 Min  07/19/2015   CLINICAL DATA:  Status post placement of a gastrojejunal feeding tube.  EXAM: DG C-ARM 61-120 MIN  COMPARISON:  None.  FINDINGS: A single C-arm view of the midline lower abdomen is submitted for interpretation. There is a gastric jejunal feeding tube. The exact position of the tube cannot be determined with this limited field-of-view and lack of injected contrast.  IMPRESSION: Feeding tube in an indeterminate position.   Electronically Signed   By: Viviann Spare  Azucena Kuba M.D.   On: 07/19/2015 10:48    Lab Results: Basic Metabolic Panel:  Recent Labs  04/54/09 0616 07/20/15 1659 07/21/15 0608  NA 140 142 145  K 2.7* 3.4* 3.9  CL 104 103 104  CO2 31 35* 37*  GLUCOSE 125* 140* 133*  BUN 9 10 12   CREATININE 0.42* 0.45 0.50  CALCIUM 5.3* 6.2* 7.1*  MG 1.1*  --  1.8   Liver Function Tests:  Recent Labs  07/21/15 0608  AST 25  ALT 20  ALKPHOS 84  BILITOT 0.3  PROT 4.8*  ALBUMIN 2.4*     CBC:  Recent Labs  07/19/15 0615  WBC 14.7*  HGB 13.6  HCT 39.5  MCV 92.7  PLT 214    Recent Results (from the past 240 hour(s))  Urine culture     Status: None   Collection Time: 07/13/15  6:59 PM  Result Value Ref Range Status    Specimen Description URINE, CATHETERIZED  Final   Special Requests NONE  Final   Culture   Final    >=100,000 COLONIES/mL ESCHERICHIA COLI >=100,000 COLONIES/mL ENTEROCOCCUS SPECIES Performed at [facility]    Report Status 07/17/2015 FINAL  Final   Organism ID, Bacteria ESCHERICHIA COLI  Final   Organism ID, Bacteria ENTEROCOCCUS SPECIES  Final      Susceptibility   Escherichia coli - MIC*    AMPICILLIN <=2 SENSITIVE Sensitive     CEFAZOLIN <=4 SENSITIVE Sensitive     CEFTRIAXONE <=1 SENSITIVE Sensitive     CIPROFLOXACIN <=0.25 SENSITIVE Sensitive     GENTAMICIN 4 SENSITIVE Sensitive     IMIPENEM <=0.25 SENSITIVE Sensitive     NITROFURANTOIN 32 SENSITIVE Sensitive     TRIMETH/SULFA <=20 SENSITIVE Sensitive     AMPICILLIN/SULBACTAM <=2 SENSITIVE Sensitive     PIP/TAZO <=4 SENSITIVE Sensitive     * >=100,000 COLONIES/mL ESCHERICHIA COLI   Enterococcus species - MIC*    AMPICILLIN <=2 SENSITIVE Sensitive     LEVOFLOXACIN 0.5 SENSITIVE Sensitive     NITROFURANTOIN <=16 SENSITIVE Sensitive     VANCOMYCIN 1 SENSITIVE Sensitive     * >=100,000 COLONIES/mL ENTEROCOCCUS SPECIES  C difficile quick scan w PCR reflex     Status: None   Collection Time: 07/14/15  4:40 PM  Result Value Ref Range Status   C Diff antigen NEGATIVE NEGATIVE Final   C Diff toxin NEGATIVE NEGATIVE Final   C Diff interpretation Negative for toxigenic C. difficile  Final  Surgical pcr screen     Status: None   Collection Time: 07/17/15  6:22 AM  Result Value Ref Range Status   MRSA, PCR NEGATIVE NEGATIVE Final   Staphylococcus aureus NEGATIVE NEGATIVE Final    Comment:        The Xpert SA Assay (FDA approved for NASAL specimens in patients over 40 years of age), is one component of a comprehensive surveillance program.  Test performance has been validated by [facility] for patients greater than or equal to 28 year old. It is not intended to diagnose infection nor to guide or monitor  treatment.      Hospital Course: This is an 81 year old who came to the emergency department because of diarrhea. She has an unknown GI problem with difficulty swallowing. She is thought to have a neurological issue not so much at gastrointestinal issue. She was treated in the hospital her diarrhea resolved and eventually after discussion it was felt that the best course for her was to  have PEG tube placement for tube feedings because she has become severely malnourished. This was accomplished. She had trouble with electrolyte imbalances that these were corrected. By the time of discharge she was tolerating tube feedings and her electrolytes were corrected  Discharge Exam: Blood pressure 124/62, pulse 71, temperature 97.9 F (36.6 C), temperature source Oral, resp. rate 20, height  (1.651 m), weight 48.7 kg (107 lb 5.8 oz), SpO2 98 %. She is awake and alert. Her chest is clear. Heart is regular. Abdomen soft  Disposition: To skilled care facility and then home with home health        Follow-up Information    Follow up with [facility].   Contact information:   [address] [place] [address] [phone]       Signed: Fredirick Maudlin   07/21/2015, 9:13 AM

## 2015-07-21 NOTE — Evaluation (Signed)
Physical Therapy Evaluation Patient Details Name: Nancy Horn MRN: [id number] DOB: May 05, 1925 Today's Date: 07/21/2015   History of Present Illness  81 year old female who  has a past medical history of Osteoporosis; Vertigo; Neuropathy; Candida esophagitis (APR 2016); Helicobacter pylori gastritis (APR 2016); and Dysphagia.  Clinical Impression  RN reports that patient is doing well today and is OK for PT to see. Patient found supine in bed and requesting to use bed pan; able to bridge for placement and removal of pan. Patient very fatigued overall today and did require increased levels of assistance for functional mobility in bed; able to complete partial stand but unable to come to full stand or ambulate due to fatigue today. Patient is overall significantly limited by fatigue and reported that she was very tired even after just sitting at edge of bed for 3-5 minutes and scooting up the bed. At this time patient is at a high fall risk due to overall weakness and fatigue, and would benefit most from SNF placement. She will remain on caseload with skilled PT services for a focus on strength, functional activity tolerance, functional mobility, balance, and gait as able until she is discharged from this facility by MD.     Follow Up Recommendations SNF    Equipment Recommendations  None recommended by PT    Recommendations for Other Services       Precautions / Restrictions Precautions Precautions: Fall;Other (comment) Precaution Comments: PEG  Restrictions Weight Bearing Restrictions: No      Mobility  Bed Mobility Overal bed mobility: Needs Assistance Bed Mobility: Supine to Sit;Sit to Supine     Supine to sit: Mod assist Sit to supine: Min assist   General bed mobility comments: very fatigued   Transfers Overall transfer level: Needs assistance Equipment used: Rolling walker (2 wheeled) Transfers: Sit to/from Stand Sit to Stand: Mod assist (for partial sit to stand,  too fatigued to attempt full sit to stand )         General transfer comment: scooting up bed with Min(A)  Ambulation/Gait             General Gait Details: too fatigued to attempt today   Stairs            Wheelchair Mobility    Modified Rankin (Stroke Patients Only)       Balance Overall balance assessment: Needs assistance Sitting-balance support: Bilateral upper extremity supported Sitting balance-Leahy Scale: Good     Standing balance support: Bilateral upper extremity supported Standing balance-Leahy Scale: Poor Standing balance comment: impaired due to fatigue and weakness                              Pertinent Vitals/Pain Pain Assessment: No/denies pain (but did intermittnetly complain of stomach pain at PEG site during session)    Home Living Family/patient expects to be discharged to:: Skilled nursing facility Living Arrangements: Alone Available Help at Discharge: Family;Available PRN/intermittently Type of Home: House Home Access: Level entry     Home Layout: One level Home Equipment: Cane - single point      Prior Function Level of Independence: Independent with assistive device(s)         Comments: 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...just recently has noticed decreased standing and walking balance and is using a cane for gait at times     Hand Dominance  Extremity/Trunk Assessment   Upper Extremity Assessment: Defer to OT evaluation           Lower Extremity Assessment: Generalized weakness      Cervical / Trunk Assessment: Kyphotic  Communication   Communication: No difficulties  Cognition Arousal/Alertness: Awake/alert Behavior During Therapy: WFL for tasks assessed/performed Overall Cognitive Status: Within Functional Limits for tasks assessed                      General Comments General comments (skin integrity, edema, etc.): patient very pleasant  but very fatigued today, unable to participate in extesnsive activity and too fatigued to attempt gait     Exercises        Assessment/Plan    PT Assessment Patient needs continued PT services  PT Diagnosis Difficulty walking;Generalized weakness   PT Problem List Decreased strength;Decreased coordination;Decreased activity tolerance;Decreased knowledge of use of DME;Decreased balance;Decreased safety awareness;Decreased mobility  PT Treatment Interventions Gait training;Functional mobility training;Therapeutic exercise;DME instruction;Balance training;Therapeutic activities   PT Goals (Current goals can be found in the Care Plan section) Acute Rehab PT Goals Patient Stated Goal: to be able to go back home PT Goal Formulation: With patient Time For Goal Achievement: 08/04/15 Potential to Achieve Goals: Fair    Frequency Min 3X/week   Barriers to discharge Decreased caregiver support lives alone     Co-evaluation               End of Session   Activity Tolerance: Patient limited by fatigue Patient left: in bed;with call bell/phone within reach;with bed alarm set Nurse Communication: Mobility status    Functional Assessment Tool Used: skilled clinical judgement based on strength, functional mobility, balance, functional activity tolerance  Functional Limitation: Mobility: Walking and moving around Mobility: Walking and Moving Around Current Status (A5409): At least 60 percent but less than 80 percent impaired, limited or restricted Mobility: Walking and Moving Around Goal Status [phone]): At least 40 percent but less than 60 percent impaired, limited or restricted    Time: 4782-9562 PT Time Calculation (min) (ACUTE ONLY): 36 min   Charges:   PT Evaluation $Initial PT Evaluation Tier I: 1 Procedure PT Treatments $Therapeutic Activity: 8-22 mins   PT G Codes:   PT G-Codes **NOT FOR INPATIENT CLASS** Functional Assessment Tool Used: skilled clinical judgement based  on strength, functional mobility, balance, functional activity tolerance  Functional Limitation: Mobility: Walking and moving around Mobility: Walking and Moving Around Current Status (Z3086): At least 60 percent but less than 80 percent impaired, limited or restricted Mobility: Walking and Moving Around Goal Status [phone]): At least 40 percent but less than 60 percent impaired, limited or restricted    Nedra Hai PT, DPT [phone]

## 2015-07-21 NOTE — Care Management Note (Signed)
Case Management Note  Patient Details  Name: [name] MRN: [id number] Date of Birth: 30-Mar-1925  Expected Discharge Date:                  Expected Discharge Plan:  Skilled Nursing Facility  In-House Referral:  Clinical Social Work  Discharge planning Services  CM Consult  Post Acute Care Choice:    Choice offered to:     DME Arranged:    DME Agency:     HH Arranged:    HH Agency:     Status of Service:  Completed, signed off  Medicare Important Message Given:  Yes-second notification given Date Medicare IM Given:    Medicare IM give by:    Date Additional Medicare IM Given:    Additional Medicare Important Message give by:     If discussed at Long Length of Stay Meetings, dates discussed:    Additional Comments: Pt discharging to SNF today. CSW has arranged for placement at [facility]. Pt is agreeable to DC plan. [facility] has been made aware of DC plan and no further CM needs identified.  Malcolm Metro, RN 07/21/2015, 10:25 AM

## 2015-07-21 NOTE — Clinical Social Work Note (Signed)
CSW facilitated discharge.  CSW spoke with Keri at [facility]. Keri made a bed offer to patient. CSW advised that patient was being discharged today.   CSW notified patient and her sister, Harriett Sine, of bed offer at [facility]. Patient accepted the bed. Patient and sister, Harriett Sine, advised that they were relieved to have this bed offer and very pleased.  CSW signing off.    Annice Needy, [place] [phone]

## 2015-07-22 ENCOUNTER — Telehealth (INDEPENDENT_AMBULATORY_CARE_PROVIDER_SITE_OTHER): Payer: Self-pay | Admitting: *Deleted

## 2015-07-22 NOTE — Telephone Encounter (Signed)
Nancy Horn was at [facility] then transferred to [facility]. She went to check on Nancy Horn this morning and found out she has not been given any ice water. Dr. Karilyn Cota told them she could have ice water. Would like to see if he would please call them with an order. Nancy Horn's return phone number is [phone].

## 2015-07-22 NOTE — Telephone Encounter (Signed)
Per Dr.Rehman the patient is to have clear liquids. He went to see her and she had been discharged to [facility]. He plans to visit her. Corrie Dandy was called and made aware.

## 2015-07-25 NOTE — H&P (Signed)
Nancy Horn MRN: [id number] DOB/AGE: 1925/09/12 81 y.o. Primary Care Physician:Mily Malecki L, MD Admit date: 07/21/2015 Chief Complaint: Inability to swallow HPI: This is documentation of history and physical performed on 07/24/2015 at the skilled care facility. She is an 81 year old who has an approximately five-month history of severe difficulty with swallowing. It is felt that she has some sort of a neurological problem causing her swallowing difficulty that has not been fully explained at this point. She had developed severe protein calorie malnutrition and during her hospitalization had PEG tube placed and she is receiving tube feedings through that. She was also found to be very deconditioned and is undergoing treatment at the skilled care facility for that is tolerating tube feedings well  Past Medical History  Diagnosis Date  . Osteoporosis   . Vertigo   . Neuropathy   . Candida esophagitis APR 2016  . Helicobacter pylori gastritis APR 2016    treated with amox and biaxin  . Dysphagia    Past Surgical History  Procedure Laterality Date  . Partial hysterectomy    . Esophagogastroduodenoscopy N/A 02/19/2015    Dr.Fields- dilated proximal esophagus, possible mid- esophageal web, mild non-erosive gastritis bx= chronic gastritis with focal activity and hpylori  . Esophageal dilation N/A 02/19/2015    Procedure: ESOPHAGEAL DILATION;  Surgeon: West Bali, MD;  Location: [facility] ENDO SUITE;  Service: Endoscopy;  Laterality: N/A;  . Peg placement N/A 07/19/2015    Procedure: PERCUTANEOUS ENDOSCOPIC 24FR GASTROSTOMY (PEG) PLACEMENT AND 12FR JEJUNAL FEEDING TUBE;  Surgeon: Malissa Hippo, MD;  Location: [facility] ORS;  Service: Endoscopy;  Laterality: N/A;  . Esophagogastroduodenoscopy (egd) with propofol N/A 07/19/2015    Procedure: ESOPHAGOGASTRODUODENOSCOPY (EGD) WITH PROPOFOL;  Surgeon: Malissa Hippo, MD;  Location: [facility] ORS;  Service: Endoscopy;  Laterality: N/A;        Family History   Problem Relation Age of Onset  . Colon cancer Neg Hx     Social History:  reports that she has never smoked. She does not have any smokeless tobacco history on file. She reports that she does not drink alcohol or use illicit drugs.   Allergies: No Known Allergies  Medications Prior to Admission  Medication Sig Dispense Refill  . acetaminophen (TYLENOL) 325 MG tablet Take 2 tablets (650 mg total) by mouth every 6 (six) hours as needed for mild pain (or Fever >/= 101).    Marland Kitchen ALPRAZolam (XANAX) 0.5 MG tablet Take 1 tablet (0.5 mg total) by mouth 3 (three) times daily as needed for anxiety.  0  . calcium carbonate, dosed in mg elemental calcium, 1250 (500 CA) MG/5ML Place 5 mLs (500 mg of elemental calcium total) into feeding tube 2 (two) times daily with a meal. 450 mL   . esomeprazole (NEXIUM) 40 MG packet Take 40 mg by mouth daily before breakfast. 30 each 12  . gabapentin (NEURONTIN) 100 MG capsule Take 300 mg by mouth at bedtime.   11  . loperamide (IMODIUM) 1 MG/5ML solution Take 2 mg by mouth as needed for diarrhea or loose stools.    . magnesium 30 MG tablet Take 1 tablet (30 mg total) by mouth 2 (two) times daily.    . meclizine (ANTIVERT) 25 MG tablet Take 25 mg by mouth at bedtime.    . Nutritional Supplements (FEEDING SUPPLEMENT, JEVITY 1.2 CAL,) LIQD Place 1,000 mLs into feeding tube daily.  0  . ondansetron (ZOFRAN ODT) 4 MG disintegrating tablet Take 1 tablet (4 mg total) by mouth  every 4 (four) hours as needed for nausea or vomiting. 4mg  ODT q4 hours prn nausea/vomit 12 tablet 0  . potassium chloride SA (K-DUR,KLOR-CON) 20 MEQ tablet Take 2 tablets (40 mEq total) by mouth 2 (two) times daily.    . promethazine (PHENERGAN) 12.5 MG tablet Take 1 tablet (12.5 mg total) by mouth every 6 (six) hours as needed for nausea or vomiting. 30 tablet 0  . ranitidine (ZANTAC) 150 MG/10ML syrup Place 10 mLs (150 mg total) into feeding tube 2 (two) times daily. 300 mL 0       ZOX:WRUEA from  the symptoms mentioned above,there are no other symptoms referable to all systems reviewed.  Physical Exam: There were no vitals taken for this visit. She has a nasal quality to her voice. She is very thin. Her pupils are reactive nodes and throat are clear her neck is supple without masses. Her chest is clear. Her heart is regular without murmur gallop or rub. Her abdomen is soft bowel sounds are present and active she has a PEG tube in place. Extremities showed no edema. Central nervous system examination other than her problems with swallowing is grossly intact   No results for input(s): WBC, NEUTROABS, HGB, HCT, MCV, PLT in the last 72 hours. No results for input(s): NA, K, CL, CO2, GLUCOSE, BUN, CREATININE, CALCIUM, MG in the last 72 hours.  Invalid input(s): PHOlablast2(ast:2,ALT:2,alkphos:2,bilitot:2,prot:2,albumin:2)@    Recent Results (from the past 240 hour(s))  Surgical pcr screen     Status: None   Collection Time: 07/17/15  6:22 AM  Result Value Ref Range Status   MRSA, PCR NEGATIVE NEGATIVE Final   Staphylococcus aureus NEGATIVE NEGATIVE Final    Comment:        The Xpert SA Assay (FDA approved for NASAL specimens in patients over 58 years of age), is one component of a comprehensive surveillance program.  Test performance has been validated by [facility] for patients greater than or equal to 6 year old. It is not intended to diagnose infection nor to guide or monitor treatment.      Dg Chest 2 View  07/13/2015   CLINICAL DATA:  Constant diarrhea and nausea for 2 days. History of gastritis, esophagitis, dysphagia and esophageal dilatation. Initial encounter.  EXAM: CHEST  2 VIEW  COMPARISON:  06/01/2015.  FINDINGS: The heart size and mediastinal contours are stable. There is marked gaseous distention of the esophagus, especially at the thoracic inlet. Appearance is unchanged. Left hemidiaphragm elevation and chronic changes at the left base are stable. The lungs  are otherwise clear. There is no pleural effusion. Thoracic spine degenerative changes and segmentation anomaly are unchanged.  IMPRESSION: No active cardiopulmonary process. Stable marked esophageal dilatation, likely achalasia.   Electronically Signed   By: Carey Bullocks M.D.   On: 07/13/2015 17:00   Dg Abd 1 View  07/20/2015   CLINICAL DATA:  Feeding tube placement  EXAM: ABDOMEN - 1 VIEW  COMPARISON:  Fluoroscopic images from feeding tube placement of July 19, 2015  FINDINGS: The feeding tube is in reasonable position with the tip in the region of the pylorus or proximal duodenum. The bowel gas pattern is normal. The left retrocardiac region is dense with obscuration of the hemidiaphragm.  IMPRESSION: Reasonable positioning of the feeding tube. Persistent left lower lobe atelectasis or pneumonia.   Electronically Signed   By: David  [place] M.D.   On: 07/20/2015 07:37   Mr Brain Wo Contrast  06/28/2015   CLINICAL DATA:  Vertigo for 3 days with weakness.  Kyphosis.  EXAM: MRI HEAD WITHOUT CONTRAST  TECHNIQUE: Multiplanar, multiecho pulse sequences of the brain and surrounding structures were obtained without intravenous contrast.  COMPARISON:  CT head 07/15/2008.  FINDINGS: No evidence for acute infarction, hemorrhage, mass lesion, hydrocephalus, or extra-axial fluid. Generalized cerebral and cerebellar atrophy. Moderately advanced T2 and FLAIR hyperintensities throughout the periventricular and subcortical white matter suggesting small vessel disease. Prominent perivascular spaces likely secondary to hypertension. No foci of chronic hemorrhage. Flow voids are preserved. Pituitary, pineal, and cerebellar tonsils unremarkable. No upper cervical lesions. BILATERAL cataract extraction. No sinus air-fluid level. Negative mastoids. Extracranial soft tissues unremarkable.  IMPRESSION: Chronic changes as described. No acute intracranial findings are observed.   Electronically Signed   By: Elsie Stain M.D.    On: 06/28/2015 18:35   Ct Abdomen Pelvis W Contrast  07/13/2015   CLINICAL DATA:  Diarrhea.  EXAM: CT ABDOMEN AND PELVIS WITH CONTRAST  TECHNIQUE: Multidetector CT imaging of the abdomen and pelvis was performed using the standard protocol following bolus administration of intravenous contrast.  CONTRAST:  50mL OMNIPAQUE IOHEXOL 300 MG/ML SOLN, OMNIPAQUE IOHEXOL 300 MG/ML SOLN  COMPARISON:  None.  FINDINGS: Mild cardiomegaly. Scarring noted in the lung bases. No pleural effusions.  Gallbladder is distended. Small layering stones noted. There is mild intrahepatic and extrahepatic biliary ductal dilatation. Concern for multiple stones within the common bile duct. This is best seen on coronal image 36. No focal hepatic abnormality. Spleen, pancreas, kidneys are unremarkable except for small benign appearing renal cysts bilaterally. No hydronephrosis.  Nonobstructive bowel gas pattern. Scattered air-fluid levels throughout the large and small bowel. This could reflect gastroenteritis. No free fluid or free air. No adenopathy. Aorta is normal caliber.  Prior hysterectomy. No adnexal masses. Urinary bladder is unremarkable.  No acute bony abnormality or focal bone lesion.  IMPRESSION: Fluid throughout the large and small bowel with scattered air-fluid levels. No evidence of bowel obstruction. Findings could reflect gastroenteritis.  Gallbladder distention with small layering stones. Similar density structures are noted in the dilated common bile duct concerning for common bile duct stones. Intrahepatic and extrahepatic biliary ductal dilatation. If further evaluation is felt warranted, MRCP or ERCP may be beneficial.  Bilateral renal cysts.  Bibasilar scarring.   Electronically Signed   By: Charlett Nose M.D.   On: 07/13/2015 18:50   Dg C-arm 1-60 Min  07/19/2015   CLINICAL DATA:  Status post placement of a gastrojejunal feeding tube.  EXAM: DG C-ARM 61-120 MIN  COMPARISON:  None.  FINDINGS: A single C-arm view  of the midline lower abdomen is submitted for interpretation. There is a gastric jejunal feeding tube. The exact position of the tube cannot be determined with this limited field-of-view and lack of injected contrast.  IMPRESSION: Feeding tube in an indeterminate position.   Electronically Signed   By: Beckie Salts M.D.   On: 07/19/2015 10:48   Impression: She has dysphagia and it's not clear what that is from. She has severe protein calorie malnutrition and is receiving treatment for that. She has neuropathy in her feet which is stable. She is deconditioned and is undergoing treatment for that Active Problems:   * No active hospital problems. *     Plan: Continue current treatments. Increase her tube feedings gradually.      Kinsley Holderman L   07/25/2015, 10:36 AM

## 2015-07-30 ENCOUNTER — Ambulatory Visit: Payer: Medicare Other | Admitting: Internal Medicine

## 2015-07-30 ENCOUNTER — Telehealth: Payer: Self-pay | Admitting: Internal Medicine

## 2015-07-30 NOTE — Telephone Encounter (Signed)
Pt was a no show

## 2015-07-30 NOTE — Telephone Encounter (Signed)
[facility] wasn't aware of OV and OV has been rescheduled

## 2015-08-09 ENCOUNTER — Encounter (HOSPITAL_COMMUNITY)
Admission: RE | Admit: 2015-08-09 | Discharge: 2015-08-09 | Disposition: A | Discharge status: Home / Self Care | Payer: Medicare Other | Source: Skilled Nursing Facility | Attending: Pulmonary Disease | Admitting: Pulmonary Disease

## 2015-08-09 LAB — BASIC METABOLIC PANEL
Anion gap: 6 (ref 5–15)
BUN: 24 mg/dL — AB (ref 6–20)
CALCIUM: 9.5 mg/dL (ref 8.9–10.3)
CHLORIDE: 104 mmol/L (ref 101–111)
CO2: 32 mmol/L (ref 22–32)
CREATININE: 0.74 mg/dL (ref 0.44–1.00)
GFR calc Af Amer: >60 mL/min (ref >60)
GFR calc non Af Amer: >60 mL/min (ref >60)
Glucose, Bld: 97 mg/dL (ref 65–99)
Potassium: 4.6 mmol/L (ref 3.5–5.1)
SODIUM: 142 mmol/L (ref 135–145)

## 2015-08-09 LAB — MAGNESIUM: Magnesium: 2.2 mg/dL (ref 1.7–2.4)

## 2015-08-10 ENCOUNTER — Other Ambulatory Visit (HOSPITAL_COMMUNITY)
Admission: AD | Admit: 2015-08-10 | Discharge: 2015-08-10 | Disposition: A | Discharge status: Home / Self Care | Payer: Medicare Other | Source: Skilled Nursing Facility | Attending: Pulmonary Disease | Admitting: Pulmonary Disease

## 2015-08-10 ENCOUNTER — Encounter: Payer: Self-pay | Admitting: Internal Medicine

## 2015-08-10 ENCOUNTER — Ambulatory Visit (INDEPENDENT_AMBULATORY_CARE_PROVIDER_SITE_OTHER): Payer: Medicare Other | Admitting: Internal Medicine

## 2015-08-10 VITALS — BP 104/55 | HR 89 | Temp 96.8°F | Ht 65.0 in | Wt 106.8 lb

## 2015-08-10 DIAGNOSIS — K8050 Calculus of bile duct without cholangitis or cholecystitis without obstruction: Secondary | ICD-10-CM

## 2015-08-10 DIAGNOSIS — R1314 Dysphagia, pharyngoesophageal phase: Secondary | ICD-10-CM | POA: Diagnosis not present

## 2015-08-10 LAB — HEPATIC FUNCTION PANEL
ALT: 31 U/L (ref 14–54)
AST: 27 U/L (ref 15–41)
Albumin: 3.6 g/dL (ref 3.5–5.0)
Alkaline Phosphatase: 119 U/L (ref 38–126)
Total Bilirubin: 0.3 mg/dL (ref 0.3–1.2)
Total Protein: 7.1 g/dL (ref 6.5–8.1)

## 2015-08-10 NOTE — Progress Notes (Signed)
PATIENT SCHEDULED  °

## 2015-08-10 NOTE — Patient Instructions (Signed)
Hepatic profile today  Have Dr. Juanetta Gosling change all of your medications to to the suspension or liquid form  Keep head of bed elevated at 30 degrees  If you develop sign / symptoms of bile duct obstruction (as discussed) call us or go to the ER  Office visit with [place] in 6 weeks

## 2015-08-10 NOTE — Progress Notes (Addendum)
Primary Care Physician:  Estanislado Pandy, MD Primary Gastroenterologist:  Dr. Jena Gauss  Pre-Procedure History & Physical:  HPI:  Pt is a 81 y.o. female here for Hospital followup for dysphagia. Patient had extensive workup here and at [facility] by Dr. Wilhelmenia Blase. Markedly dilated esophagus,  long-standing insidiously progressive esophageal dysphagia. Barium pill esophagram CT and manometry performed. Patient has a nonfunctioning, dilated esophagus in the colon nonperistaltic esophagus, normal LES pressures.Probable have a neuropathy. No achalasia. PEG with PEJ placed by Dr. Karilyn Cota first of this month. The J. Portion failed. That tube appears to have been removed and she is doing very well with a simple gastrostomy tube. Her pills/other oral medications an issue  - staff having trouble placed into the tube. She's not really having any regurgitation or vomiting. She is basically getting all of her nutritional support including fluids via the G-tube. As a separate issue, she is noted incidentally have a common duct stone on CT. He has no signs or symptoms, whatsoever, of biliary obstruction. Gallbladder remains in place. She is worried about getting her medications converted to the suspension form.    Past Medical History  Diagnosis Date  . Syncope     Neurocardiogenic - positive tilt table test, on Florinef  . Allergy to radiographic dye   . Diverticulosis of colon (without mention of hemorrhage)     TCS by Dr. Jena Gauss  . Irritable bowel syndrome   . GERD (gastroesophageal reflux disease)   . Peripheral polyneuropathy     Confirmed by EMG and nerve conduction velocities   . Migraine   . Pulmonary nodule   . Mitral regurgitation   . Hemorrhoids, internal     TCS by Dr. Jena Gauss  . Chronic diastolic heart failure   . Tubular adenoma     Past Surgical History  Procedure Laterality Date  . Wrist surgery    . Foot surgery    . Total abdominal hysterectomy    . Appendectomy    . Back surgery    . Back  pain      going to chiropracator for back and hip pain  . Colonoscopy  05/11/2005    internal hemorrhoids, L side diverticula - Dr. Jena Gauss  . Esophagogastroduodenoscopy  11/15/2006      Dr. Elmer Ramp esophagus/Patulous EG junction, small to moderate sized hiatal hernia,   multiple fundal gland type appearing polyps biopsied, otherwise normal  . Colonoscopy N/A 03/27/2013    Dr. Jena Gauss- normal rectum, scattered L sided diverticula. bx= tubular adenoma    Prior to Admission medications   Medication Sig Start Date End Date Taking? Authorizing Provider  aspirin EC 81 MG tablet Take 1 tablet (81 mg total) by mouth daily. 08/07/12  Yes Rande Brunt, PA-C  Cholecalciferol (VITAMIN D-3) 1000 UNITS CAPS Take 1 capsule by mouth daily.   Yes Historical Provider, MD  DEXILANT 60 MG capsule TAKE 1 CAPSULE DAILY 04/08/15  Yes Tiffany Kocher, PA-C  estradiol (ESTRACE) 0.1 MG/GM vaginal cream Place 4 g vaginally daily. Use twice a week   Yes Historical Provider, MD  fexofenadine (ALLEGRA) 180 MG tablet Take 180 mg by mouth daily.   Yes Historical Provider, MD  fludrocortisone (FLORINEF) 0.1 MG tablet TAKE 1 TABLET THREE TIMES A WEEK 07/02/15  Yes Jonelle Sidle, MD  fluticasone [facility]) 50 MCG/ACT nasal spray Place 2 sprays into the nose as needed for allergies.    Yes Historical Provider, MD  furosemide (LASIX) 20 MG tablet Take 1 tablet (20 mg  total) by mouth daily. Pt states she does not take it on Sundays and when she has a doctor's appt. 08/02/15  Yes Jonelle Sidle, MD  gabapentin (NEURONTIN) 100 MG capsule TAKE 2 CAPSULES ( ) AT BEDTIME AS TOLERATED 03/08/15  Yes Micki Riley, MD  Glucosamine-Chondroitin-MSM [phone] MG TABS Take 1 tablet by mouth daily.   Yes Historical Provider, MD  Hypromellose 0.3 % SOLN Apply to eye. Applies to both eyes daily   Yes Historical Provider, MD  levothyroxine (SYNTHROID, LEVOTHROID) 75 MCG tablet Take 75 mcg by mouth daily before breakfast.   Yes Historical  Provider, MD  mirabegron ER (MYRBETRIQ) 50 MG TB24 tablet Take 50 mg by mouth daily.   Yes Historical Provider, MD  montelukast (SINGULAIR) 10 MG tablet Take 10 mg by mouth daily.     Yes Historical Provider, MD  Multiple Vitamins-Minerals ([employer] SPECTRAVITE SENIOR) TABS Take 1 tablet by mouth daily.     Yes Historical Provider, MD  nitrofurantoin (MACRODANTIN) 50 MG capsule Take 50 mg by mouth at bedtime.   Yes Historical Provider, MD  nitroGLYCERIN (NITROSTAT) 0.4 MG SL tablet Place 0.4 mg under the tongue every 5 (five) minutes as needed for chest pain. 08/07/12  Yes Rande Brunt, PA-C  Omega-3 Fatty Acids (FISH OIL PO) Take by mouth daily.   Yes Historical Provider, MD  Probiotic Product (ALIGN PO) Take 1 tablet by mouth daily.    Yes Historical Provider, MD  topiramate (TOPAMAX) 50 MG tablet Takes 2 tablets in the morning and 2 tablet in the evening. 07/22/15  Yes Butch Penny, NP  vitamin C (ASCORBIC ACID) 500 MG tablet Take 500 mg by mouth daily.    Yes Historical Provider, MD  Wheat Dextrin (BENEFIBER DRINK MIX) PACK Take by mouth 2 (two) times daily.   Yes Historical Provider, MD    Allergies as of 08/10/2015 - Review Complete 08/10/2015  Allergen Reaction Noted  . Cephalexin Anaphylaxis and Swelling 01/20/2009  . Doxazosin mesylate Other (See Comments) 03/19/2013  . Codeine Other (See Comments) 01/20/2009  . Indomethacin Other (See Comments) 01/20/2009  . Ivp dye [iodinated diagnostic agents] Hives 03/19/2013  . Levofloxacin Other (See Comments) 01/20/2009  . Morphine Other (See Comments) 01/20/2009  . Sulfamethoxazole-trimethoprim Other (See Comments)   . Penicillins Other (See Comments) 01/20/2009    Family History  Problem Relation Age of Onset  . Colon cancer Neg Hx   . Stroke Father     Social History   Social History  . Marital Status: Widowed    Spouse Name: N/A  . Number of Children: 4  . Years of Education: college   Occupational History  .      Retired    Social History Main Topics  . Smoking status: Never Smoker   . Smokeless tobacco: Never Used     Comment: tobacco use - no  . Alcohol Use: No  . Drug Use: No  . Sexual Activity: Not on file   Other Topics Concern  . Not on file   Social History Narrative   Retired, widowed, education college education .   Right handed.   Caffeine one cup of coffee daily.     Review of Systems: See HPI, otherwise negative ROS  Physical Exam: BP 124/81 mmHg  Pulse 66  Temp(Src) 97.1 F (36.2 C)  Ht  (1.753 m)  Wt 191 lb 3.2 oz (86.728 kg)  BMI 28.22 kg/m2 General:   Alert,  Very well oriented conversant pleasant and  cooperative in NADshe is accompanied by a good friend. Skin:  Intact without significant lesions or rashes.No jaundice Neck:  Supple; no masses or thyromegaly. No significant cervical adenopathy. Lungs:  Clear throughout to auscultation.   No wheezes, crackles, or rhonchi. No acute distress. Heart:  Regular rate and rhythm; no murmurs, clicks, rubs,  or gallops. Abdomen: Non-distended, normal bowel sounds.  24 [place] G-tube present. External bumper at 3 cm which appears appropriate. Appropriately snug. No J-tube extension with G tube. Abdomen is non-tender to palpation  Pulses:  Normal pulses noted. Extremities:  Without clubbing or edema.  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 She is doing very well with the G-tube feedings. Medication instillation continues to be a challenge. She is going home from the [place] [facility] in a couple of days. Common duct stone without any symptoms at this time. This will need to be dealt with but no need to urgently pursue at this time.  Recommendations:  Hepatic profile today  Have Dr. Juanetta Gosling change all of your medications to to the suspension or liquid form  Keep head of bed elevated at 30 degrees  If you develop sign / symptoms of bile duct obstruction (as discussed) call us or go to the ER  Office visit with  [place] in 6 weeks  Addendum LFTs from the [facility] 08/10/15: AP 19    AST 27   ALT 31   total bili  0.3    Notice: This dictation was prepared with Dragon dictation along with smaller phrase technology. Any transcriptional errors that result from this process are unintentional and may not be corrected upon review.

## 2015-08-16 ENCOUNTER — Telehealth: Payer: Self-pay

## 2015-08-16 NOTE — Telephone Encounter (Signed)
Nancy Horn pharmacist called and states that he had some concerns about pt medications. Wants to see if Mag citrate would be ok and had another question about her omeprazole. States for Dr. Jena Gauss to call him on his personal cell [phone]

## 2015-08-16 NOTE — Progress Notes (Signed)
Correction: Patient's pcp is Dr. Juanetta Gosling not Dr. Neita Carp

## 2015-08-17 NOTE — Telephone Encounter (Signed)
Discussed with the pharmacist. Nancy Horn substitute magnesium citrate at the appropriate dose for oral magnesium to facilitate medication administration the G-tube

## 2015-08-17 NOTE — Telephone Encounter (Signed)
Noted  

## 2015-09-22 ENCOUNTER — Encounter: Payer: Self-pay | Admitting: Gastroenterology

## 2015-09-22 ENCOUNTER — Ambulatory Visit (INDEPENDENT_AMBULATORY_CARE_PROVIDER_SITE_OTHER): Payer: Medicare Other | Admitting: Gastroenterology

## 2015-09-22 VITALS — BP 115/67 | HR 93 | Temp 96.2°F | Ht 65.0 in | Wt 109.4 lb

## 2015-09-22 DIAGNOSIS — R1314 Dysphagia, pharyngoesophageal phase: Secondary | ICD-10-CM

## 2015-09-22 DIAGNOSIS — K8070 Calculus of gallbladder and bile duct without cholecystitis without obstruction: Secondary | ICD-10-CM | POA: Diagnosis not present

## 2015-09-22 DIAGNOSIS — R1319 Other dysphagia: Secondary | ICD-10-CM

## 2015-09-22 DIAGNOSIS — R1310 Dysphagia, unspecified: Secondary | ICD-10-CM

## 2015-09-22 MED ORDER — MAGNESIUM CITRATE PO SOLN: ORAL | Status: AC

## 2015-09-22 NOTE — Patient Instructions (Signed)
1. I will check to see if Nexium will be covered by your insurance. You will need prior authorization. 2. I will discuss possibility of using a medication to dissolve gallstones in your situation. Please let us know if you develop abdominal pain, nausea/vomiting, fever, etc.

## 2015-09-22 NOTE — Progress Notes (Signed)
Primary Care Physician: Fredirick Maudlin, MD  Primary Gastroenterologist:  Roetta Sessions, MD   Chief Complaint  Patient presents with  . Follow-up    HPI: Nancy Horn is a 81 y.o. female here for follow up of dysphagia. Last seen in 08/10/2015. Extensive workup here and at [facility] for Dr. Gwinda Passe. Markedly dilated esophagus, long-standing insidious progressive esophageal dysphagia. Barium pill esophagram, CT and manometry performed. Patient has a nonfunctioning, dilated esophagus and nonperistaltic esophagus, normal ileus pressures. Probably neuropathy. No achalasia. PEG with PEJ placed to Dr. Karilyn Cota first of the month. The J portion failed. She has been doing well with simple gastrostomy tube. Oral medications, food provided via G-tube. Takes 5 and half cans of Jevity daily. Also noted to incidentally have a common duct stone on CT scan without any signs of biliary obstruction. LFTs a been normal. Gallbladder remains in place.  She is not interested in pursuing ERCP.  Wt Readings from Last 3 Encounters:  09/22/15 109 lb 6.4 oz (49.624 kg)  08/10/15 106 lb 12.8 oz (48.444 kg)  07/13/15 107 lb 5.8 oz (48.7 kg)    Current Outpatient Prescriptions  Medication Sig Dispense Refill  . ALPRAZolam (XANAX) 0.5 MG tablet Take 1 tablet (0.5 mg total) by mouth 3 (three) times daily as needed for anxiety.  0  . calcium carbonate, dosed in mg elemental calcium, 1250 (500 CA) MG/5ML Place 5 mLs (500 mg of elemental calcium total) into feeding tube 2 (two) times daily with a meal. 450 mL   . gabapentin (NEURONTIN) 100 MG capsule Take 300 mg by mouth at bedtime.   11  . meclizine (ANTIVERT) 25 MG tablet Take 25 mg by mouth at bedtime.    . Nutritional Supplements (FEEDING SUPPLEMENT, JEVITY 1.2 CAL,) LIQD Place 1,000 mLs into feeding tube daily.  0  . omeprazole (PRILOSEC) 20 MG capsule Take 20 mg by mouth daily.   1  . Potassium Chloride 40 MEQ/15ML (20%) SOLN Take 15 mLs by mouth 2 (two)  times daily.    . magnesium citrate SOLN 1 cc twice daily     No current facility-administered medications for this visit.    Allergies as of 09/22/2015  . (No Known Allergies)   Past Medical History  Diagnosis Date  . Osteoporosis   . Vertigo   . Neuropathy (HCC)   . Candida esophagitis (HCC) APR 2016  . Helicobacter pylori gastritis APR 2016    treated with amox and biaxin  . Dysphagia    Past Surgical History  Procedure Laterality Date  . Partial hysterectomy    . Esophagogastroduodenoscopy N/A 02/19/2015    Dr.Fields- dilated proximal esophagus, possible mid- esophageal web, mild non-erosive gastritis bx= chronic gastritis with focal activity and hpylori  . Esophageal dilation N/A 02/19/2015    Procedure: ESOPHAGEAL DILATION;  Surgeon: West Bali, MD;  Location: [facility];  Service: Endoscopy;  Laterality: N/A;  . Peg placement N/A 07/19/2015    Dr.Rehman - 49F gtube, 39F jejunal feeding tube could only be advanced into the second part of the duodenum.  . Esophagogastroduodenoscopy (egd) with propofol N/A 07/19/2015    Dr.Rehman- dilated esophageal body with pooling of secretions, antral gastritis, 49F gtube placed percutaneously    ROS:  General: Negative for anorexia, weight loss, fever, chills, fatigue, weakness. ENT: Negative for hoarseness,   nasal congestion. See hpi CV: Negative for chest pain, angina, palpitations, dyspnea on exertion, peripheral edema.  Respiratory: Negative for dyspnea at rest, dyspnea  on exertion, cough, sputum, wheezing.  GI: See history of present illness. GU:  Negative for dysuria, hematuria, urinary incontinence, urinary frequency, nocturnal urination.  Endo: Negative for unusual weight change.    Physical Examination:   BP 115/67 mmHg  Pulse 93  Temp(Src) 96.2 F (35.7 C)  Ht 5\' 5"  (1.651 m)  Wt 109 lb 6.4 oz (49.624 kg)  BMI 18.21 kg/m2  General: Well-nourished, well-developed in no acute distress.  Eyes: No icterus. Mouth:  Oropharyngeal mucosa moist and pink , no lesions erythema or exudate. Lungs: Clear to auscultation bilaterally.  Heart: Regular rate and rhythm, no murmurs rubs or gallops.  Abdomen: Bowel sounds are normal, nontender, nondistended, no hepatosplenomegaly or masses, no abdominal bruits or hernia , no rebound or guarding.  G-tube in place without erythema or drainage.  Extremities: No lower extremity edema. No clubbing or deformities. Neuro: Alert and oriented x 4   Skin: Warm and dry, no jaundice.   Psych: Alert and cooperative, normal mood and affect.  Labs:  Lab Results  Component Value Date   ALT 31 08/10/2015   AST 27 08/10/2015   ALKPHOS 119 08/10/2015   BILITOT 0.3 08/10/2015   Lab Results  Component Value Date   CREATININE 0.74 08/09/2015   BUN 24* 08/09/2015   NA 142 08/09/2015   K 4.6 08/09/2015   CL 104 08/09/2015   CO2 32 08/09/2015   Magnesium 2.2 on 08/09/15.  Lab Results  Component Value Date   WBC 14.7* 07/19/2015   HGB 13.6 07/19/2015   HCT 39.5 07/19/2015   MCV 92.7 07/19/2015   PLT 214 07/19/2015    Imaging Studies: No results found.  Impression/Plan:  Dysfunctional esophagus secondary to neuropathy. Doing well with G-tube feedings. Converted most for medications over, continues to have some issues at times administering omeprazole through the tube. We'll check into other PPI options.  Common duct stone without symptoms, normal LFTs. Patient is not interested in pursuing ERCP. She wants to continue observing clinically. ?URSO for gallstone dissolution as an option although can take months to year to show resolution. Tablets cannot be crushed, may have capsule form which could be administered through PEG. If develops signs/symptoms of bile duct obstruction as discussed she will call us or go to the emergency department. To discuss further Dr. Jena Gaussourk.

## 2015-09-30 NOTE — Progress Notes (Signed)
cc'ed to pcp °

## 2015-10-25 ENCOUNTER — Other Ambulatory Visit: Payer: Self-pay | Admitting: Gastroenterology

## 2015-11-03 NOTE — Telephone Encounter (Signed)
Can we find out if Nexium packets are covered by insurance? She has PEG tube. Needs Once daily.

## 2015-11-04 NOTE — Telephone Encounter (Signed)
nexium is not covered on pts plan, only omeprazole and pantoprazole. Also tier 2 medications have an $8.00 copay, tier 4 medications have an $85.00 copay (tier 4 is non-preferred medications). I have put a copy of the formulary on your desk.

## 2015-11-16 NOTE — Telephone Encounter (Signed)
Please let patient know that Nexium is not covered on her insurance and omeprazole is the only one covered that is in capsule form.   I am still waiting on Dr. Luvenia Starchourk's response regarding possibility of medication to try to dissolve gallstones.

## 2015-11-18 NOTE — Telephone Encounter (Signed)
Tried to call pt- NA 

## 2016-02-07 ENCOUNTER — Encounter (HOSPITAL_COMMUNITY): Payer: Self-pay

## 2016-02-10 ENCOUNTER — Encounter: Payer: Self-pay | Admitting: Internal Medicine

## 2016-02-10 ENCOUNTER — Other Ambulatory Visit: Payer: Self-pay | Admitting: Gastroenterology

## 2016-02-10 ENCOUNTER — Telehealth: Payer: Self-pay | Admitting: Gastroenterology

## 2016-02-10 ENCOUNTER — Other Ambulatory Visit: Payer: Self-pay

## 2016-02-10 DIAGNOSIS — K8070 Calculus of gallbladder and bile duct without cholecystitis without obstruction: Secondary | ICD-10-CM

## 2016-02-10 NOTE — Telephone Encounter (Signed)
Patient is due for follow up of choledocholithiasis. She needs LFTs prior to her OV. Please schedule with me.

## 2016-02-10 NOTE — Telephone Encounter (Signed)
APPT MADE AND LETTER SENT  °

## 2016-02-10 NOTE — Telephone Encounter (Signed)
Lab order done and mailed to the pt.  

## 2016-02-23 LAB — HEPATIC FUNCTION PANEL
ALK PHOS: 90 U/L (ref 33–130)
ALT: 18 U/L (ref 6–29)
AST: 20 U/L (ref 10–35)
Albumin: 4 g/dL (ref 3.6–5.1)
BILIRUBIN DIRECT: 0.1 mg/dL (ref <=0.2)
BILIRUBIN INDIRECT: 0.2 mg/dL (ref 0.2–1.2)
BILIRUBIN TOTAL: 0.3 mg/dL (ref 0.2–1.2)
Total Protein: 6.9 g/dL (ref 6.1–8.1)

## 2016-03-06 ENCOUNTER — Ambulatory Visit: Payer: Medicare Other | Admitting: Gastroenterology

## 2016-03-27 ENCOUNTER — Encounter: Payer: Self-pay | Admitting: Gastroenterology

## 2016-03-27 ENCOUNTER — Ambulatory Visit: Payer: Medicare Other | Admitting: Gastroenterology

## 2016-03-27 ENCOUNTER — Telehealth: Payer: Self-pay

## 2016-03-27 ENCOUNTER — Other Ambulatory Visit: Payer: Self-pay

## 2016-03-27 ENCOUNTER — Ambulatory Visit (INDEPENDENT_AMBULATORY_CARE_PROVIDER_SITE_OTHER): Payer: Medicare Other | Admitting: Gastroenterology

## 2016-03-27 VITALS — BP 97/61 | HR 91 | Temp 97.2°F | Ht 65.0 in | Wt 114.6 lb

## 2016-03-27 DIAGNOSIS — R1319 Other dysphagia: Secondary | ICD-10-CM

## 2016-03-27 DIAGNOSIS — R221 Localized swelling, mass and lump, neck: Secondary | ICD-10-CM | POA: Insufficient documentation

## 2016-03-27 DIAGNOSIS — R1314 Dysphagia, pharyngoesophageal phase: Secondary | ICD-10-CM

## 2016-03-27 DIAGNOSIS — K8070 Calculus of gallbladder and bile duct without cholecystitis without obstruction: Secondary | ICD-10-CM | POA: Diagnosis not present

## 2016-03-27 DIAGNOSIS — R1310 Dysphagia, unspecified: Secondary | ICD-10-CM

## 2016-03-27 NOTE — Assessment & Plan Note (Signed)
82 year old female with severe esophageal motility disorder requiring feeding tube placement as outlined above. She is tolerating her feedings well. Weight is up. More recently has had more issues with her oral secretions. Swallows her saliva but feels like she is "filling up"and in the secretions 1 a come back out. Often she has to cough and feels like she is strangling when this occurs. It affects her speech. Seems to be worse in the evenings. On exam today, she had an episode of urge to clear her throat. Continuously coughed and/or spit out clear mucus. Her neck appeared to be more bulging during these episodes although the left was notably larger than the right side. With palpation there does not appear to be a solid mass but her "swollen neck" seemed to decompress with palpation. Based on these findings, recent development of symptoms, would recommend neck CT for further evaluation. This could be progressive esophageal dysmotility further evaluation is warranted at this time. Patient agrees to plan.

## 2016-03-27 NOTE — Patient Instructions (Signed)
1. We will get a CT of your neck to evaluate not only worsening issues with swallowing saliva but with the swelling noted on your neck.  2. If you have increasing shortness of breath, increasing difficulty with secretions, please go to the ER.

## 2016-03-27 NOTE — Assessment & Plan Note (Signed)
Discussed at length with patient today. Remains asymptomatic. Again discussed options of Urso. I had discussed previously with Dr. Jena Gaussourk. Per Dr. Jena Gaussourk, "Efficacy of Urso for for noncalcified stones proven not so much for calcified stones. She does not want an ERCP . Depending on size and number of stones could take well over a year for them to dissolve. Must keep in mind, as they get smaller, they may occlude ampullary segment to pass and could cause a problem clinically." Given this information, patient decided she did not want to do anything about the stones. She did not want to pursue further imaging. Recent LFTs were normal.

## 2016-03-27 NOTE — Progress Notes (Signed)
cc'ed to pcp °

## 2016-03-27 NOTE — Progress Notes (Signed)
Primary Care Physician: Fredirick MaudlinHAWKINS,EDWARD L, MD  Primary Gastroenterologist:  Roetta SessionsMichael Rourk, MD   Chief Complaint  Patient presents with  . Follow-up    HPI: Nancy Horn is a 82 y.o. female here for follow-up. History of esophageal dysphagia related to esophageal motility disorder resulting in malnutrition last year. She required PEG with PEJ placed by Dr. Karilyn Cotaehman first of September. The J portion failed. She has gained 5 pounds since we last saw her in November. Also noted to incidentally have a common duct stone on CT scan without signs of biliary obstruction. LFTs have been normal. Gallbladder remains in place. She was not interested in pursuing ERCP at last office visit. Last imaging was in August 2016.  Patient complains of difficulty with secretions. Notes that one week ago, she had to carry around a bag to spit out her saliva into. Little better this week. Worse when she lays down at night. She sleeps in a hospital bed with her head of bed raised. She takes nothing by mouth. Had been about to swallow saliva without issues up until last few weeks. Now feels like backing up and has to cough it out/spit it out. Affects talking. Didn't want to bring it up today because she doesn't want any invasive procedures.   Otherwise tolerating G tube feedings. Does not regurgitate feedings. Using ranitidine liquid instead of PPI because PPI's kept clogging up her tube. No heartburn. No abdominal pain. Bowels ok. No melena, brbpr.     Current Outpatient Prescriptions  Medication Sig Dispense Refill  . ALPRAZolam (XANAX) 0.5 MG tablet Take 1 tablet (0.5 mg total) by mouth 3 (three) times daily as needed for anxiety.  0  . calcium carbonate, dosed in mg elemental calcium, 1250 (500 CA) MG/5ML Place 5 mLs (500 mg of elemental calcium total) into feeding tube 2 (two) times daily with a meal. 450 mL   . gabapentin (NEURONTIN) 100 MG capsule Take 600 mg by mouth at bedtime.   11  . magnesium  citrate SOLN 1 cc twice daily    . meclizine (ANTIVERT) 25 MG tablet Take 25 mg by mouth at bedtime.    . Nutritional Supplements (FEEDING SUPPLEMENT, JEVITY 1.2 CAL,) LIQD Place 1,000 mLs into feeding tube daily.  0  . Potassium Chloride 40 MEQ/15ML (20%) SOLN Take 15 mLs by mouth 2 (two) times daily.    . ranitidine (ZANTAC) 15 MG/ML syrup Take 150 mg by mouth 2 (two) times daily.      No current facility-administered medications for this visit.    Allergies as of 03/27/2016  . (No Known Allergies)    ROS:  General: Negative for anorexia, weight loss, fever, chills, fatigue, weakness. ENT: Negative for hoarseness,   nasal congestion. See hpi CV: Negative for chest pain, angina, palpitations, dyspnea on exertion, peripheral edema.  Respiratory: Negative for dyspnea at rest, dyspnea on exertion, cough, sputum, wheezing.  GI: See history of present illness. GU:  Negative for dysuria, hematuria, urinary incontinence, urinary frequency, nocturnal urination.  Endo: Negative for unusual weight change.    Physical Examination:   BP 97/61 mmHg  Pulse 91  Temp(Src) 97.2 F (36.2 C) (Oral)  Ht 5\' 5"  (1.651 m)  Wt 114 lb 9.6 oz (51.982 kg)  BMI 19.07 kg/m2  General: Well-nourished, well-developed in no acute distress.  Eyes: No icterus. Mouth: Oropharyngeal mucosa moist and pink , no lesions erythema or exudate. NECK: left anterior neck appears to be bulging compared to right.  No solid mass, with palpation bulging less prominent.  Lungs: Clear to auscultation bilaterally.  Heart: Regular rate and rhythm, no murmurs rubs or gallops.  Abdomen: Bowel sounds are normal, nontender, nondistended, no hepatosplenomegaly or masses, no abdominal bruits or hernia , no rebound or guarding.  G-tube site without drainage.  Extremities: No lower extremity edema. No clubbing or deformities. Neuro: Alert and oriented x 4   Skin: Warm and dry, no jaundice.   Psych: Alert and cooperative, normal mood  and affect.  Labs:  Lab Results  Component Value Date   ALT 18 02/22/2016   AST 20 02/22/2016   ALKPHOS 90 02/22/2016   BILITOT 0.3 02/22/2016    Imaging Studies: No results found.

## 2016-03-27 NOTE — Telephone Encounter (Signed)
CT scan approved by [employer]. PA # is [id number] valid from 05/15-06/13

## 2016-03-28 ENCOUNTER — Ambulatory Visit (HOSPITAL_COMMUNITY)
Admission: RE | Admit: 2016-03-28 | Discharge: 2016-03-28 | Disposition: A | Discharge status: Home / Self Care | Payer: Medicare Other | Source: Ambulatory Visit | Attending: Gastroenterology | Admitting: Gastroenterology

## 2016-03-28 DIAGNOSIS — R1314 Dysphagia, pharyngoesophageal phase: Secondary | ICD-10-CM | POA: Diagnosis not present

## 2016-03-28 DIAGNOSIS — R1310 Dysphagia, unspecified: Secondary | ICD-10-CM

## 2016-03-28 DIAGNOSIS — R221 Localized swelling, mass and lump, neck: Secondary | ICD-10-CM | POA: Insufficient documentation

## 2016-03-28 DIAGNOSIS — K8070 Calculus of gallbladder and bile duct without cholecystitis without obstruction: Secondary | ICD-10-CM | POA: Diagnosis not present

## 2016-03-28 DIAGNOSIS — K228 Other specified diseases of esophagus: Secondary | ICD-10-CM | POA: Insufficient documentation

## 2016-03-28 DIAGNOSIS — R1319 Other dysphagia: Secondary | ICD-10-CM

## 2016-03-28 LAB — POCT I-STAT, CHEM 8
BUN: 33 mg/dL — AB (ref 6–20)
CHLORIDE: 99 mmol/L — AB (ref 101–111)
CREATININE: 0.8 mg/dL (ref 0.44–1.00)
Calcium, Ion: 1.35 mmol/L — ABNORMAL HIGH (ref 1.13–1.30)
Glucose, Bld: 153 mg/dL — ABNORMAL HIGH (ref 65–99)
HEMATOCRIT: 43 % (ref 36.0–46.0)
HEMOGLOBIN: 14.6 g/dL (ref 12.0–15.0)
POTASSIUM: 5 mmol/L (ref 3.5–5.1)
Sodium: 141 mmol/L (ref 135–145)
TCO2: 32 mmol/L (ref 0–100)

## 2016-03-28 MED ORDER — IOPAMIDOL (ISOVUE-300) INJECTION 61%
75.0000 mL | Freq: Once | INTRAVENOUS | Status: AC | PRN
  Administered 2016-03-28: 75 mL via INTRAVENOUS

## 2016-03-30 NOTE — Progress Notes (Signed)
Quick Note:  Please let patient know her esophagus is massively dilated all the way up into her neck and that explains abnormal exam day of her OV. [facility]   [facility] need [facility] to see Dr. Gwinda PasseGilliam and associates. ______

## 2016-03-30 NOTE — Progress Notes (Signed)
Quick Note:  Pt is aware and OK to [facility]. ______

## 2016-03-31 ENCOUNTER — Telehealth: Payer: Self-pay

## 2016-03-31 NOTE — Progress Notes (Signed)
Quick Note:  Tried to call Dr. Juanetta GoslingHawkins and line busy. ______

## 2016-03-31 NOTE — Progress Notes (Signed)
Quick Note:  Noted. I have also discussed findings and concerns with patient. See separate telephone note. ______

## 2016-03-31 NOTE — Progress Notes (Signed)
Quick Note:  Forward to Dr. Juanetta GoslingHawkins for review. Please make contact with his nurse. ______

## 2016-03-31 NOTE — Telephone Encounter (Signed)
I spoke with the patient and made her aware that Nancy Horn will be calling her shortly to address her concerns.

## 2016-03-31 NOTE — Telephone Encounter (Signed)
I have spoken to patient and her friend Dot. All questions and concerns were addressed. She agrees to going back to [facility] to discuss any possible options regarding her dilated esophagus and inability to pass air/saliva. She already has a feeding tube but now she cannot swallow her saliva and esophagus distended into neck.  She was told if she had SOB to go to nearest ER.  Please fax copy of labs done in radiology to Dr. Juanetta GoslingHawkins and touch base with his nurse to make them aware in case they feel like patient needs further labs.

## 2016-03-31 NOTE — Telephone Encounter (Signed)
Pt called and states that she is upset because she wasn't given the opportunity to ask any questions about her referral and results. States she is 82 years old and sometimes she gets confused but she is really upset because she wasn't given an opportunity to express her concern. I allowed pt to express her concern and told her I would call her back after speaking to [facility].   I preceded to go over pt test results again and explain what needed to happen next.   [facility] and spoke with scheduler. Dr. Gwinda PasseGilliam doesn't have any open appointments until July.  Scheduler sent Dr. Gwinda PasseGilliam nurse and him a message to see when pt could be seen ASAP due to the severity of her esophagus status.  Called pt and told her I didn't have her appointment yet due to Dr. Sander NephewGilliam's schedule.  Pt interrupted me and stated " I demand to speak with Dr. Jena Gaussourk before I go anywhere."  I explain to pt that doctor Rourk is seeing patients in the office this morning and he would have to speak with her after his office visits were completed.   Pt is very upset and yelling on the phone "I need to speak to him now."  " I am a patient and I need to speak with him."   Pt is very upset and I gave the phone call to Ave Filteramille McCollum for her to address patients concern.

## 2016-04-02 NOTE — Telephone Encounter (Signed)
PT HAVING PROBLEMS WITH FEEDING TUBE. ASKED TO COME TO SHORT STAY FOR AN EVALUATION. PT WILL ARRIVE BETWEEN 1130A-12N TODAY

## 2016-04-02 NOTE — Telephone Encounter (Signed)
PT SEEN AND EXAMINE. MED LIST REVIEWED. PEG LUMEN WORN AND PARTIALLY OCCLUDED BY TUBE FEED RESIDUE. ABLE TO USE 60 ML SYRINGE TO FLUSH AND ASPIRATE WITH EASE. USED 60 CC TO SHOW TUBE EMPTIES BY GRAVITY. PT NOTES THAT TUBES EMPTYING MORE SLOWLY.IT USUALLY TAKES HER 20 MINS TO GET TWO CANS IN.   COKE INFUSED INTO LUMEN x2. AFTER COKE INFUSED SYRINGE AGAIN EMPTIED BY GRAVITY. PT GIVEN 2 60 CC SYRINGES TO TAKE HOME. PLUNGER REMOVED FROM NOT UNITS BECAUSE SHE IS UNABLE TO USE THE SYRINGE TO PUSH FEEDS INTO TUBE. EXPLAINED SHE COULD HAVE A FEEDING PUMP. SHOWED HER A VIDEO ON YOU TUBE, BUT PT FELT SHE COULD DO BETTER WITH THE SYRINGES. AWAITING APPT AT [facility]. NEEDS PEGJ CHANGE BY DR,. REHMAN.

## 2016-04-03 ENCOUNTER — Other Ambulatory Visit (INDEPENDENT_AMBULATORY_CARE_PROVIDER_SITE_OTHER): Payer: Self-pay | Admitting: *Deleted

## 2016-04-03 DIAGNOSIS — R627 Adult failure to thrive: Secondary | ICD-10-CM

## 2016-04-03 NOTE — Progress Notes (Signed)
Quick Note:  I faxed the lab results to Dr. Juanetta GoslingHawkins. I called to speak to his nurse and got VM.  I left VM for Joy that Tana CoastLeslie Lewis, PA wanted Dr. Juanetta GoslingHawkins to address the labs for this pt. I asked her to please call and let me know that she got the labs and the message. ______

## 2016-04-03 NOTE — Telephone Encounter (Signed)
Routing to Fiservnna Tilley to schedule the peg change.

## 2016-04-03 NOTE — Telephone Encounter (Signed)
PER Nur's office/Endo PEG change be done at 12:30 tomorrow  Routing to BreckenridgeAnn to place order Raynelle FanningJulie gave the patient instructions

## 2016-04-03 NOTE — Telephone Encounter (Signed)
BECAUSE IT'S A PEGJ NOT A PEG.

## 2016-04-03 NOTE — Telephone Encounter (Signed)
Per Shawna OrleansMelanie she spoke with NUR and he will be able to do the PEG change tomorrow at 1230.  Nancy FanningJulie spoke with the patient and gave her prep instructions and appt time

## 2016-04-03 NOTE — Telephone Encounter (Signed)
I thought you all place PEGs and changed them too. Just curious why you want me to change PEG. Is it because I placed it to begin with. Let me know.

## 2016-04-04 ENCOUNTER — Telehealth (INDEPENDENT_AMBULATORY_CARE_PROVIDER_SITE_OTHER): Payer: Self-pay | Admitting: Internal Medicine

## 2016-04-04 ENCOUNTER — Encounter (HOSPITAL_COMMUNITY): Payer: Self-pay | Admitting: *Deleted

## 2016-04-04 ENCOUNTER — Ambulatory Visit (HOSPITAL_COMMUNITY)
Admission: RE | Admit: 2016-04-04 | Discharge: 2016-04-04 | Disposition: A | Discharge status: Home / Self Care | Payer: Medicare Other | Source: Ambulatory Visit | Attending: Internal Medicine | Admitting: Internal Medicine

## 2016-04-04 ENCOUNTER — Encounter (HOSPITAL_COMMUNITY)
Admission: RE | Disposition: A | Discharge status: Home / Self Care | Payer: Self-pay | Source: Ambulatory Visit | Attending: Internal Medicine

## 2016-04-04 ENCOUNTER — Telehealth: Payer: Self-pay

## 2016-04-04 DIAGNOSIS — R627 Adult failure to thrive: Secondary | ICD-10-CM | POA: Diagnosis not present

## 2016-04-04 DIAGNOSIS — G629 Polyneuropathy, unspecified: Secondary | ICD-10-CM

## 2016-04-04 DIAGNOSIS — K9423 Gastrostomy malfunction: Secondary | ICD-10-CM | POA: Insufficient documentation

## 2016-04-04 DIAGNOSIS — R1310 Dysphagia, unspecified: Secondary | ICD-10-CM

## 2016-04-04 HISTORY — PX: PEG PLACEMENT: SHX5437

## 2016-04-04 SURGERY — REPLACEMENT, PEG TUBE, WITHOUT ENDOSCOPY
Anesthesia: Moderate Sedation

## 2016-04-04 MED ORDER — SILVER NITRATE-POT NITRATE 75-25 % EX MISC: Freq: Every day | CUTANEOUS | Status: AC

## 2016-04-04 NOTE — Telephone Encounter (Signed)
Noted. To discuss with Dr. Jena Gaussourk, Primary GI.

## 2016-04-04 NOTE — Op Note (Signed)
PEG change without endoscopy.  Patient has 3424 JamaicaFrench action pull-type tube. Granulation tissue noted at stoma. Gastric tube was pulled out without any difficulty. 24 French angled balloon replacement gastrostomy tube advanced via existent fistula to stomach. Balloon filled with 6 mL of sterile water. Position was confirmed by injecting a resulting in typical gurgling sound as well as by aspirating bilious fluid. Few was secured by placing bolster closed the skin and sterile dressing applied. Patient tolerated the procedure well.  Recommendations:  Patient will resume bolus feeding as before. Treat granulation tissue with topical silver nitrate.

## 2016-04-04 NOTE — H&P (Signed)
Nancy Horn is an 82 y.o. female.   Chief Complaint: Patient is here for PEG change. HPI: Patient is a 82 year old Caucasian female who had PEG placed 1 year ago for severe esophageal dysphagia second read to motility disorder resulting in malnutrition and weight loss. She actually had PEG/PEJ. Jejunal feeding tube did not work and was removed. Tube is not clogging. She is therefore here to have a change.  Past Medical History  Diagnosis Date  . Osteoporosis   . Vertigo   . Neuropathy (HCC)   . Candida esophagitis (HCC) APR 2016  . Helicobacter pylori gastritis APR 2016    treated with amox and biaxin  . Dysphagia     Past Surgical History  Procedure Laterality Date  . Partial hysterectomy    . Esophagogastroduodenoscopy N/A 02/19/2015    Dr.Fields- dilated proximal esophagus, possible mid- esophageal web, mild non-erosive gastritis bx= chronic gastritis with focal activity and hpylori  . Esophageal dilation N/A 02/19/2015    Procedure: ESOPHAGEAL DILATION;  Surgeon: West Bali, MD;  [facility];  Service: Endoscopy;  Laterality: N/A;  . Peg placement N/A 07/19/2015    Dr.Jayna Mulnix - 29F gtube, 25F jejunal feeding tube could only be advanced into the second part of the duodenum.  . Esophagogastroduodenoscopy (egd) with propofol N/A 07/19/2015    Dr.Capri Veals- dilated esophageal body with pooling of secretions, antral gastritis, 29F gtube placed percutaneously    Family History  Problem Relation Age of Onset  . Colon cancer Neg Hx    Social History:  reports that she has never smoked. She does not have any smokeless tobacco history on file. She reports that she does not drink alcohol or use illicit drugs.  Allergies: No Known Allergies  Medications Prior to Admission  Medication Sig Dispense Refill  . ALPRAZolam (XANAX) 0.5 MG tablet Take 1 tablet (0.5 mg total) by mouth 3 (three) times daily as needed for anxiety.  0  . calcium carbonate, dosed in mg elemental calcium,  1250 (500 CA) MG/5ML Place 5 mLs (500 mg of elemental calcium total) into feeding tube 2 (two) times daily with a meal. 450 mL   . gabapentin (NEURONTIN) 100 MG capsule Take 600 mg by mouth at bedtime.   11  . magnesium citrate SOLN 1 cc twice daily    . meclizine (ANTIVERT) 25 MG tablet Take 25 mg by mouth at bedtime.    . Nutritional Supplements (FEEDING SUPPLEMENT, JEVITY 1.2 CAL,) LIQD Place 1,000 mLs into feeding tube daily.  0  . Potassium Chloride 40 MEQ/15ML (20%) SOLN Take 15 mLs by mouth 2 (two) times daily.    . ranitidine (ZANTAC) 15 MG/ML syrup Take 150 mg by mouth 2 (two) times daily.       No results found for this or any previous visit (from the past 48 hour(s)). No results found.  ROS  Blood pressure 114/61, pulse 78, temperature 98.2 F (36.8 C), temperature source Oral, resp. rate 18, height  (1.651 m), weight 114 lb (51.71 kg), SpO2 98 %. Physical Exam  Constitutional: She appears well-developed and well-nourished.  HENT:  Mouth/Throat: Oropharynx is clear and moist.  Neck: No thyromegaly present.  GI:  Gastrostomy tube is in place. Caliber is irregular with food debris in it. There is granulation tissue at stoma.  Musculoskeletal: She exhibits no edema.  Lymphadenopathy:    She has no cervical adenopathy.  Neurological: She is alert.  Skin: Skin is warm.     Assessment/Plan Malfunctioning  PEG tube.   [name],[name], MD 04/04/2016, 12:52 PM

## 2016-04-04 NOTE — Telephone Encounter (Signed)
Patient called, stated that the feeding tube is keeping food in the tube itself.  Stated that she has gotten 2 cans down and poured water in afterwards like she is supposed to, but the food is staying in the tube itself.  Her concern is that when she puts her medication in the tube and runs the water in it, is it going to sit in the tube like the food has.  She would like a call back.  [phone]

## 2016-04-04 NOTE — Telephone Encounter (Signed)
REVIEWED-NO ADDITIONAL RECOMMENDATIONS. 

## 2016-04-04 NOTE — Telephone Encounter (Signed)
T/C from Dr[facility] at [facility] ( [phone]). He said there is nothing that he can do for pt. He said he had wanted pt to see a neurologist, but she has not seen one out there.  He said pt has systemic and peripheral neuropathy.  He had thought she should have a 24 hour urine screening for heavy metal and arsenic. She never had that done out there. He said you could see his notes if needed.

## 2016-04-04 NOTE — Telephone Encounter (Signed)
I was notified the patient was seeing Dr. Karilyn Cotaehman for PEG assessment today. Input from [facility] at [facility] noted.  There is no need for her to come back to see us before she has her neurology evaluation. To wrap up the GI evaluation, I agree, patient needs a screen for heavy metals. Can we go ahead and order  Now?

## 2016-04-04 NOTE — Discharge Instructions (Signed)
Resume gastric feeding as before. Treat granulation tissue at gastrostomy site with topicals silver nitrate 2-3 times a week until it is resolved. Keep gastrostomy tube site dry at all times.  Care of a Feeding Tube People who have trouble swallowing or cannot take food or medicine by mouth are sometimes given feeding tubes. A feeding tube can go into the nose and down to the stomach or through the skin in the abdomen and into the stomach or small bowel. Some of the names of these feeding tubes are gastrostomy tubes, PEG lines, nasogastric tubes, and gastrojejunostomy tubes.  SUPPLIES NEEDED TO CARE FOR THE TUBE SITE  Clean gloves.  Clean wash cloth, gauze pads, or soft paper towel.  Cotton swabs.  Skin barrier ointment or cream.  Soap and water.  Pre-cut foam pads or gauze (that go around the tube).  Tube tape. TUBE SITE CARE 1. Have all supplies ready and available. 2. Wash hands well. 3. Put on clean gloves. 4. Remove the soiled foam pad or gauze, if present, that is found under the tube stabilizer. Change the foam pad or gauze daily or when soiled or moist. 5. Check the skin around the tube site for redness, rash, swelling, drainage, or extra tissue growth. If you notice any of these, call your caregiver. 6. Moisten gauze and cotton swabs with water and soap. 7. Wipe the area closest to the tube (right near the stoma) with cotton swabs. Wipe the surrounding skin with moistened gauze. Rinse with water. 8. Dry the skin and stoma site with a dry gauze pad or soft paper towel. Do not use antibiotic ointments at the tube site. 9. If the skin is red, apply a skin barrier cream or ointment (such as petroleum jelly) in a circular motion, using a cotton swab. The cream or ointment will provide a moisture barrier for the skin and helps with wound healing. 10. Apply a new pre-cut foam pad or gauze around the tube. Secure it with tape around the edges. If no drainage is present, foam pads or  gauze may be left off. 11. Use tape or an anchoring device to fasten the feeding tube to the skin for comfort or as directed. Rotate where you tape the tube to avoid skin damage from the adhesive. 12. Position the person in a semi-upright position (30-45 degree angle). 13. Throw away used supplies. 14. Remove gloves. 15. Wash hands. SUPPLIES NEEDED TO FLUSH A FEEDING TUBE  Clean gloves.  60 mL syringe (that connects to the feeding tube).  Towel.  Water. FLUSHING A FEEDING TUBE  1. Have all supplies ready and available. 2. Wash hands well. 3. Put on clean gloves. 4. Draw up 30 mL of water in the syringe. 5. Kink the feeding tube while disconnecting it from the feeding-bag tubing or while removing the plug at the end of the tube. Kinking closes the tube and prevents secretions in the tube from spilling out. 6. Insert the tip of the syringe into the end of the feeding tube. Release the kink. Slowly inject the water. 7. If unable to inject the water, the person with the feeding tube should lay on his or her left side. The tip of the tube may be against the stomach wall, blocking fluid flow. Changing positions may move the tip away from the stomach wall. After repositioning, try injecting the water again. 8. After injecting the water, remove the syringe. 9. Always flush before giving the first medicine, between medicines, and after the final  medicine before starting a feeding. This prevents medicines from clogging the tube. 10. Throw away used supplies. 11. Remove gloves. 12. Wash hands.   This information is not intended to replace advice given to you by your health care provider. Make sure you discuss any questions you have with your health care provider.   Document Released: 10/30/2005 Document Revised: 10/16/2012 Document Reviewed: 06/13/2012 Elsevier Interactive Patient Education [employer]2016 Elsevier Inc.

## 2016-04-05 ENCOUNTER — Ambulatory Visit (HOSPITAL_COMMUNITY)
Admission: RE | Admit: 2016-04-05 | Discharge: 2016-04-05 | Disposition: A | Discharge status: Home / Self Care | Payer: Medicare Other | Source: Ambulatory Visit | Attending: Internal Medicine | Admitting: Internal Medicine

## 2016-04-05 ENCOUNTER — Other Ambulatory Visit: Payer: Self-pay

## 2016-04-05 ENCOUNTER — Encounter (HOSPITAL_COMMUNITY)
Admission: RE | Disposition: A | Discharge status: Home / Self Care | Payer: Self-pay | Source: Ambulatory Visit | Attending: Internal Medicine

## 2016-04-05 ENCOUNTER — Other Ambulatory Visit (INDEPENDENT_AMBULATORY_CARE_PROVIDER_SITE_OTHER): Payer: Self-pay | Admitting: *Deleted

## 2016-04-05 ENCOUNTER — Encounter (HOSPITAL_COMMUNITY): Payer: Self-pay | Admitting: *Deleted

## 2016-04-05 DIAGNOSIS — K9423 Gastrostomy malfunction: Secondary | ICD-10-CM

## 2016-04-05 DIAGNOSIS — R627 Adult failure to thrive: Secondary | ICD-10-CM

## 2016-04-05 DIAGNOSIS — R1310 Dysphagia, unspecified: Secondary | ICD-10-CM

## 2016-04-05 DIAGNOSIS — G629 Polyneuropathy, unspecified: Secondary | ICD-10-CM

## 2016-04-05 DIAGNOSIS — Z79899 Other long term (current) drug therapy: Secondary | ICD-10-CM | POA: Diagnosis not present

## 2016-04-05 DIAGNOSIS — R1314 Dysphagia, pharyngoesophageal phase: Secondary | ICD-10-CM | POA: Diagnosis not present

## 2016-04-05 HISTORY — PX: PEG PLACEMENT: SHX5437

## 2016-04-05 HISTORY — PX: ESOPHAGOGASTRODUODENOSCOPY: SHX5428

## 2016-04-05 SURGERY — EGD (ESOPHAGOGASTRODUODENOSCOPY)
Anesthesia: Moderate Sedation

## 2016-04-05 MED ORDER — STERILE WATER FOR IRRIGATION IR SOLN
Status: DC | PRN
  Administered 2016-04-05: 2.5 mL

## 2016-04-05 MED ORDER — MEPERIDINE HCL 50 MG/ML IJ SOLN
INTRAMUSCULAR | Status: AC
  Filled 2016-04-05: qty 1

## 2016-04-05 MED ORDER — BUTAMBEN-TETRACAINE-BENZOCAINE 2-2-14 % EX AERO
INHALATION_SPRAY | CUTANEOUS | Status: DC | PRN
  Administered 2016-04-05: 1 via TOPICAL

## 2016-04-05 MED ORDER — MIDAZOLAM HCL 5 MG/5ML IJ SOLN
INTRAMUSCULAR | Status: DC | PRN
  Administered 2016-04-05 (×2): 1 mg via INTRAVENOUS

## 2016-04-05 MED ORDER — MEPERIDINE HCL 50 MG/ML IJ SOLN
INTRAMUSCULAR | Status: DC | PRN
  Administered 2016-04-05: 25 mg

## 2016-04-05 MED ORDER — MIDAZOLAM HCL 5 MG/5ML IJ SOLN
INTRAMUSCULAR | Status: AC
  Filled 2016-04-05: qty 10

## 2016-04-05 MED ORDER — SODIUM CHLORIDE 0.9 % IV SOLN
INTRAVENOUS | Status: DC
  Administered 2016-04-05: 1000 mL via INTRAVENOUS

## 2016-04-05 NOTE — Telephone Encounter (Signed)
Spoke with Raynelle FanningJulie, orders placed.

## 2016-04-05 NOTE — H&P (Signed)
Nancy Horn is an 82 y.o. female.   Chief Complaint: Patient is here to have peg tube changed endoscopically HPI: Patient is 83 year old Caucasian female who had gastrostomy tube placed one year ago for severe esophageal dysphagia due to motility disorder. Tube was changed yesterday because of malfunction. She had balloon replacement angle to place without any difficulty. However she and her family are not able to use it have therefore requested to be changed to the one she had to begin with.  Past Medical History  Diagnosis Date  . Osteoporosis   . Vertigo   . Neuropathy (HCC)   . Candida esophagitis (HCC) APR 2016  . Helicobacter pylori gastritis APR 2016    treated with amox and biaxin  . Dysphagia     Past Surgical History  Procedure Laterality Date  . Partial hysterectomy    . Esophagogastroduodenoscopy N/A 02/19/2015    Dr.Fields- dilated proximal esophagus, possible mid- esophageal web, mild non-erosive gastritis bx= chronic gastritis with focal activity and hpylori  . Esophageal dilation N/A 02/19/2015    Procedure: ESOPHAGEAL DILATION;  Surgeon: West Bali, MD;  Location: [facility];  Service: Endoscopy;  Laterality: N/A;  . Peg placement N/A 07/19/2015    Dr.Aryaman Haliburton - 48F gtube, 52F jejunal feeding tube could only be advanced into the second part of the duodenum.  . Esophagogastroduodenoscopy (egd) with propofol N/A 07/19/2015    Dr.Jayleen Scaglione- dilated esophageal body with pooling of secretions, antral gastritis, 48F gtube placed percutaneously    Family History  Problem Relation Age of Onset  . Colon cancer Neg Hx   . Angina Mother   . Hypothyroidism Mother   . Melanoma Mother   . Heart failure Father   . Melanoma Sister   . Prostate cancer Brother    Social History:  reports that she has never smoked. She does not have any smokeless tobacco history on file. She reports that she does not drink alcohol or use illicit drugs.  Allergies: No Known  Allergies  Medications Prior to Admission  Medication Sig Dispense Refill  . ALPRAZolam (XANAX) 0.5 MG tablet Take 1 tablet (0.5 mg total) by mouth 3 (three) times daily as needed for anxiety.  0  . calcium carbonate, dosed in mg elemental calcium, 1250 (500 CA) MG/5ML Place 5 mLs (500 mg of elemental calcium total) into feeding tube 2 (two) times daily with a meal. 450 mL   . gabapentin (NEURONTIN) 100 MG capsule Take 600 mg by mouth at bedtime.   11  . magnesium citrate SOLN 1 cc twice daily    . meclizine (ANTIVERT) 25 MG tablet Take 25 mg by mouth at bedtime.    . Nutritional Supplements (FEEDING SUPPLEMENT, JEVITY 1.2 CAL,) LIQD Place 1,000 mLs into feeding tube daily.  0  . Potassium Chloride 40 MEQ/15ML (20%) SOLN Take 15 mLs by mouth 2 (two) times daily.    . ranitidine (ZANTAC) 15 MG/ML syrup Take 150 mg by mouth 2 (two) times daily.     . silver nitrate applicators 75-25 % applicator Apply topically daily. Apply silver nitrate solution to granulation tissue 3 times a week as directed until it is completely resolved. 100 each 0    No results found for this or any previous visit (from the past 48 hour(s)). No results found.  ROS  Blood pressure 114/67, pulse 73, temperature 97.8 F (36.6 C), temperature source Oral, resp. rate 19, height  (1.651 m), weight 114 lb (51.71 kg), SpO2 100 %.  Physical Exam  Constitutional: She appears well-developed and well-nourished.  HENT:  Mouth/Throat: Oropharynx is clear and moist.  Eyes: Conjunctivae are normal. No scleral icterus.  Neck: No thyromegaly present.  Cardiovascular: Normal rate, regular rhythm and normal heart sounds.   No murmur heard. Respiratory: Effort normal and breath sounds normal.  GI:  Abdominal exam reveals gastrostomy tube in place. She has granulation tissue and stoma. Abdomen is soft and nontender without organomegaly or masses.  Musculoskeletal: She exhibits no edema.  Lymphadenopathy:    She has no cervical  adenopathy.  Neurological: She is alert.  Skin: Skin is warm and dry.     Assessment/Plan PEG malfunction. PEG change at endoscopy.  [name],Enis Leatherwood U, MD 04/05/2016, 3:22 PM

## 2016-04-05 NOTE — Telephone Encounter (Signed)
Per Dr.Rehman - the patient should flush with water after medication has been given. Patient was called and made aware.

## 2016-04-05 NOTE — Telephone Encounter (Signed)
Spoke with [facility] customer service- he gave me the test codes for heavy metal panel but it is not in epic. He also said the cadmium would need to be ordered separately but the pt only needs one container for all of them. It has to be a metal free container. I called [facility] in [place], spoke with Selma, they do have metal free container for 24 hour urine and they are aware pt will be by today to pick it up. [facility] orders [facility] to [facility].

## 2016-04-05 NOTE — Op Note (Signed)
[facility] Patient Name: Nancy Horn Procedure Date: 04/05/2016 3:18 PM MRN: [id number] Date of Birth: Sep 26, 1925 Attending MD: Lionel December , MD CSN: [id number] Age: 82 Admit Type: Inpatient Procedure:                Upper GI endoscopy Indications:              Esophageal dysphagia, Replace PEG tube due to                            malfunctioning gastrostomy tube Providers:                Lionel December, MD, Jannett Celestine, RN, Birder Robson,                            Technician Referring MD:              Medicines:                Cetacaine spray, Meperidine 25 mg IV, Midazolam 2                            mg IV Complications:            No immediate complications. Estimated Blood Loss:     Estimated blood loss: none. Procedure:                Pre-Anesthesia Assessment:                           - Prior to the procedure, a History and Physical                            was performed, and patient medications and                            allergies were reviewed. The patient's tolerance of                            previous anesthesia was also reviewed. The risks                            and benefits of the procedure and the sedation                            options and risks were discussed with the patient.                            All questions were answered, and informed consent                            was obtained. Prior Anticoagulants: The patient has                            taken no previous anticoagulant or antiplatelet                            agents.  ASA Grade Assessment: III - A patient with                            severe systemic disease. After reviewing the risks                            and benefits, the patient was deemed in                            satisfactory condition to undergo the procedure.                           After obtaining informed consent, the endoscope was                            passed under direct vision.  Throughout the                            procedure, the patient's blood pressure, pulse, and                            oxygen saturations were monitored continuously. The                            EG-[id number] ([id number]) scope was introduced through the                            mouth, and advanced to the antrum of the stomach.                            The upper GI endoscopy was accomplished without                            difficulty. The patient tolerated the procedure                            well. Scope In: 3:32:18 PM Scope Out: 3:41:22 PM Total Procedure Duration: 0 hours 9 minutes 4 seconds  Findings:      dilated esophagus with frothy liquid.      No gross lesions were noted in the entire examined stomach. The PEG       required removal because it was not functioning. The PEG was removed       under endoscopic vision. Removal was easily accomplished. An       endoscopically removable 24 Fr EndoVive Safety gastrostomy tube was       lubricated. The guide wire was passed through the existing G-tube port       and snared endoscopically. The endoscope and snare were then removed,       pulling the wire out through the mouth. The g-tube was passed over the       guidewire through the mouth, into the stomach and out through the G-tube       port. The bumper was attached to the gastrostomy tube. The feeding tube       was then cut to an appropriate  length. The final position of the       gastrostomy tube was confirmed by relook endoscopy, and skin marking       noted to be 4 cm at the external bumper. The final tension and       compression of the abdominal wall by the PEG tube and external bumper       were checked and revealed that the bumper was loose and lightly touching       the skin. The tube was capped, and the tube site was cleaned and dressed. Impression:               - No gross lesions in the stomach.                           - The PEG was removed because it was not                             functioning, and replaced with an endoscopically                            removable PEG.                           - No specimens collected. Moderate Sedation:      Moderate (conscious) sedation was administered by the endoscopy nurse       and supervised by the endoscopist. The following parameters were       monitored: oxygen saturation, heart rate, blood pressure, CO2       capnography and response to care. Total physician intraservice time was       15 minutes. Recommendation:           - Discharge patient to home [Means].                           - Patient has a contact number available for                            emergencies. The signs and symptoms of potential                            delayed complications were discussed with the                            patient. Return to normal activities tomorrow.                            Written discharge instructions were provided to the                            patient.                           - Please follow the post-PEG recommendations                            including: advance food and medications per primary  care provider and change dressing once per day.                           - Make the patient NPO [Start day].                           - Continue present medications. Procedure Code(s):        --- Professional ---                           [phone], 52, Esophagogastroduodenoscopy, flexible,                            transoral; with directed placement of percutaneous                            gastrostomy tube                           99152, Moderate sedation services provided by the                            same physician or other qualified health care                            professional performing the diagnostic or                            therapeutic service that the sedation supports,                            requiring the presence of an independent  trained                            observer to assist in the monitoring of the                            patient's level of consciousness and physiological                            status; initial 15 minutes of intraservice time,                            patient age 69 years or older                           76, Moderate sedation services provided by the                            same physician or other qualified health care                            professional performing the diagnostic or  therapeutic service that the sedation supports,                            requiring the presence of an independent trained                            observer to assist in the monitoring of the                            patient's level of consciousness and physiological                            status; initial 15 minutes of intraservice time,                            patient age 65 years or older Diagnosis Code(s):        --- Professional ---                           [phone], Gastrostomy malfunction                           R13.14, Dysphagia, pharyngoesophageal phase CPT copyright 2016 American [employer] Association. All rights reserved. The codes documented in this report are preliminary and upon coder review may  be revised to meet current compliance requirements. Lionel DecemberNajeeb Candida Vetter, MD Lionel DecemberNajeeb Adnan Vanvoorhis, MD 04/05/2016 4:16:11 PM This report has been signed electronically. Number of Addenda: 0

## 2016-04-05 NOTE — Telephone Encounter (Signed)
Talked with the patient. She states that she cannot or does not have the strength in her hands to work with the tube and syringe.  She had to call her friend over last night because her food was running out. She also told me that she had talked [facility] and that her friend is coming to our office now.  Mrs. Darien RamusNancy Horn presented to the office and says that we have a problem. Patient's previous tube she could handle it as it had a clamp on it. Patient has problems with her hands and she cannot handle this tube,syringe , ect. [name] has brought a clamp and this did not work , it only slowed the fluid down. They would appreciate some help as the patient is wanting to remain independent .  Both were advised that Dr.Rehman would be made aware. Per Dr.Rehman - have her added on for today EGD/Endoscopy with conscious sedation.  Patient and MS. Horn to be called and given instructions as soon as possible.

## 2016-04-05 NOTE — Telephone Encounter (Signed)
Spoke with patient. She asked that I call her back. She was receiving another call she needed to take regarding her feeding tube. Told patient that I would call her back in a few minutes.   Spoke with patient again at 1100am. Explained Dr. Gwinda PasseGilliam suggested heavy metal screen and Dr. Jena Gaussourk agrees. [facility]. Agrees to heavy metal screen and will go to lab today to get container for 24 hour urine.  Patient needs 24 hour urinary screen for arsenic, lead, mercury, cadmium. Test code Q 542

## 2016-04-05 NOTE — Discharge Instructions (Signed)
Resume gastric feeding as before. Resume usual medications. No driving for 24 hours.        PEG Tube Home Guide A PEG tube is used to put food and fluids into the stomach. Before you leave the hospital, make sure that you know:  How to care for your PEG tube.  How to care for the opening (stoma) in your belly.  How to give yourself feedings and medicines.  When to call your doctor for help. HOW DO I CARE FOR MY PEG TUBE? Check your PEG tube every day. Make sure:  It is not too tight.  It is in the right place. There is a mark on the tube that shows when the tube is in the right place. Adjust the tube if you need to. HOW DO I CARE FOR MY STOMA? Clean your stoma every day. Follow these steps: 1. Wash your hands with soap and water. 2. Wash the stoma gently with warm, soapy water. 3. Rinse the stoma with warm water. 4. Pat the stoma area dry. Check your stoma for:  Redness.  Leaking.  Skin irritation. HOW DO I GIVE A FEEDING? Your doctor will tell you:  How much nutrition and fluid you will need for each feeding.  How often to have a feeding.  Whether you should take medicine in the tube by itself or with a feeding. To give yourself a feeding, follow these steps. 1. Lay out all of the things that you will need. 2. Make sure that the nutritional formula is at room temperature. 3. Wash your hands with soap and water. 4. Sit up or stand up straight. You will need to stay straight while you give yourself a feeding. 5. Make sure the syringe plunger is pushed in. Place the tip of the syringe in clear water, and slowly pull the plunger to bring (draw up) the water into the syringe. 6. Remove the clamp and the cap from the PEG tube. 7. Push the water out of the syringe to clean (flush) the tube. 8. If the tube is clear, draw up the formula into the syringe. Make sure to use the right amount for each feeding. Add water if you need to. 9. Slowly push the formula from the  syringe through the tube. 10. After the feeding, flush the tube with water. 11. Put the clamp and the cap on the tube. 12. Stay sitting up or standing up straight for at least 30 minutes. HOW DO I GIVE MEDICINE? To give yourself medicine, follow these steps: 1. Lay out all of the things that you will need. 2. If your medicine is in tablet form, crush it and dissolve it in water. 3. Wash your hands with soap and water. 4. Sit up or stand up straight. You will need to stay straight while you give yourself medicine. 5. Make sure the syringe plunger is pushed in. Place the tip of the syringe in clear water, and slowly pull the plunger to bring (draw up) the water into the syringe. 6. Remove the clamp and the cap from the PEG tube. 7. Push the water out of the syringe to clean (flush) the tube. 8. If the tube is clear, draw up the medicine into the syringe. 9. Slowly push the medicine from the syringe through the tube. 10. Flush the tube with water. 11. Put the clamp and the cap on the tube. 12. Stay sitting up or standing up straight for at least 30 minutes. You should not take sustained  release (SR) medicines through your tube. If you are not sure if your medicine is a SR medicine, ask your doctor or pharmacist. GET HELP IF:  The area around your stoma is sore, irritated, or red.  You have belly pain or bloating while you are feeding or afterward.  You have a feeling of being sick to your stomach (nausea) that does not go away.  You cannot poop (constipated) or you have watery poop (diarrhea) for a long time.  You have a fever.  You have problems with your PEG tube. GET HELP RIGHT AWAY IF:  Your tube is blocked.  Your tube falls out.  You have pain around your tube.  You are bleeding from your tube.  Your tube is leaking.  You choke or you have trouble breathing while you are feeding or afterward.   This information is not intended to replace advice given to you by your  health care provider. Make sure you discuss any questions you have with your health care provider.   Document Released: 12/02/2010 Document Revised: 03/16/2015 Document Reviewed: 11/04/2014 Elsevier Interactive Patient Education 2016 Elsevier Inc.        Esophagogastroduodenoscopy, Care After Refer to this sheet in the next few weeks. These instructions provide you with information about caring for yourself after your procedure. Your health care provider may also give you more specific instructions. Your treatment has been planned according to current medical practices, but problems sometimes occur. Call your health care provider if you have any problems or questions after your procedure. WHAT TO EXPECT AFTER THE PROCEDURE After your procedure, it is typical to feel:  Soreness in your throat.  Pain with swallowing.  Sick to your stomach (nauseous).  Bloated.  Dizzy.  Fatigued. HOME CARE INSTRUCTIONS  Do not eat or drink anything until the numbing medicine (local anesthetic) has worn off and your gag reflex has returned. You will know that the local anesthetic has worn off when you can swallow comfortably.  Do not drive or operate machinery until directed by your health care provider.  Take medicines only as directed by your health care provider. SEEK MEDICAL CARE IF:  5. You cannot stop coughing. 6. You are not urinating at all or less than usual. SEEK IMMEDIATE MEDICAL CARE IF:  You have difficulty swallowing.  You cannot eat or drink.  You have worsening throat or chest pain.  You have dizziness or lightheadedness or you faint.  You have nausea or vomiting.  You have chills.  You have a fever.  You have severe abdominal pain.  You have black, tarry, or bloody stools.   This information is not intended to replace advice given to you by your health care provider. Make sure you discuss any questions you have with your health care provider.   Document  Released: 10/16/2012 Document Revised: 11/20/2014 Document Reviewed: 10/16/2012 Elsevier Interactive Patient Education [employer]

## 2016-04-05 NOTE — Progress Notes (Signed)
Quick Note:  LMOM for the nurse, to please let me know if she received the labs. ______

## 2016-04-05 NOTE — Telephone Encounter (Signed)
Patient was called and advised to be at Hospital at 2:30 pm for procedure. She was made aware that this was a work in and there may be a wait. She has a friend who will be bringing her .

## 2016-04-06 ENCOUNTER — Encounter (HOSPITAL_COMMUNITY): Payer: Self-pay | Admitting: Internal Medicine

## 2016-04-06 NOTE — Progress Notes (Signed)
Quick Note:  Nancy Horn called and said they did receive the labs and she has given to Dr. Juanetta GoslingHawkins. They will address appropriately. ______

## 2016-04-07 ENCOUNTER — Encounter (HOSPITAL_COMMUNITY): Payer: Self-pay | Admitting: Internal Medicine

## 2016-04-14 LAB — MERCURY, URINE, 24 HOUR

## 2016-04-14 LAB — CADMIUM, URINE, 24 HOUR: Cadmium, 24H Ur: 0.3 mcg/L (ref <=5.0)

## 2016-04-14 LAB — ARSENIC FRACTIONATION, 24 HR URINE
Collection Duration: 24 h
URINE VOLUME: 1900 mL

## 2016-04-14 LAB — LEAD, URINE, 24 HOUR

## 2016-04-17 NOTE — Progress Notes (Signed)
Quick Note:  Please let patient know her heavy metal screens were negative. 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 ______

## 2016-05-02 ENCOUNTER — Telehealth: Payer: Self-pay

## 2016-05-02 NOTE — Telephone Encounter (Signed)
Referral to neurologist of her choice, locally would be Dr. Gerilyn Pilgrimoonquah. [facility]   Let's have her give herself one can at a time only. Do this 5-6 times per day.  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   If she continues to have nausea/fullness, we may have to consider going back to pump.

## 2016-05-02 NOTE — Telephone Encounter (Signed)
Pt called- she has not seen a neurologist since she was in the hospital and she will need a referral to see one. (see blood work results)  Pt also c/o feeling too full after her feedings. She was on a pump prior to going to the nursing home last September, they transitioned her to bolus feeds. She was doing 5.5 cans of jevity a day (2 in the am, 2 at noon and 1.5 at night), now she is lucky if she can get in 4 cans a day. Usually 1.5-2 is all she can handle. She said she has a lot of nausea if she tries to put in anymore. No vomiting. Pt is concerned that she is not getting enough calories and also that something else is wrong.

## 2016-05-03 ENCOUNTER — Other Ambulatory Visit: Payer: Self-pay

## 2016-05-03 DIAGNOSIS — R1310 Dysphagia, unspecified: Secondary | ICD-10-CM

## 2016-05-03 DIAGNOSIS — R1319 Other dysphagia: Secondary | ICD-10-CM

## 2016-05-03 NOTE — Telephone Encounter (Signed)
REFERRAL HAS BEEN MADE  

## 2016-05-03 NOTE — Telephone Encounter (Signed)
Pt is aware. She said Dr.Doonquah was fine for her referral. She is going to try to do her feedings per LSL recommendations. She said she doesn't want to go back to the pump unless she has to. Her tube is fitting fine and it is snug, she is not having any problems with it at this time.  [facility] has already sent referral and pt is aware.

## 2016-05-09 ENCOUNTER — Telehealth: Payer: Self-pay | Admitting: Internal Medicine

## 2016-05-09 NOTE — Telephone Encounter (Signed)
Nancy Horn from Dr Ronal Fearoonquah's office called to let us know that patient is aware of her OV with them tomorrow at 315 pm

## 2016-05-09 NOTE — Telephone Encounter (Signed)
Noted  

## 2017-01-27 IMAGING — DX DG CHEST 2V
2 series · 2 of 2 positions shown · non-contrast
Comparison: None.

CLINICAL DATA: Fall with non painful knot near the left breast.
Initial encounter.

EXAM:
CHEST  2 VIEW

[chest pa]
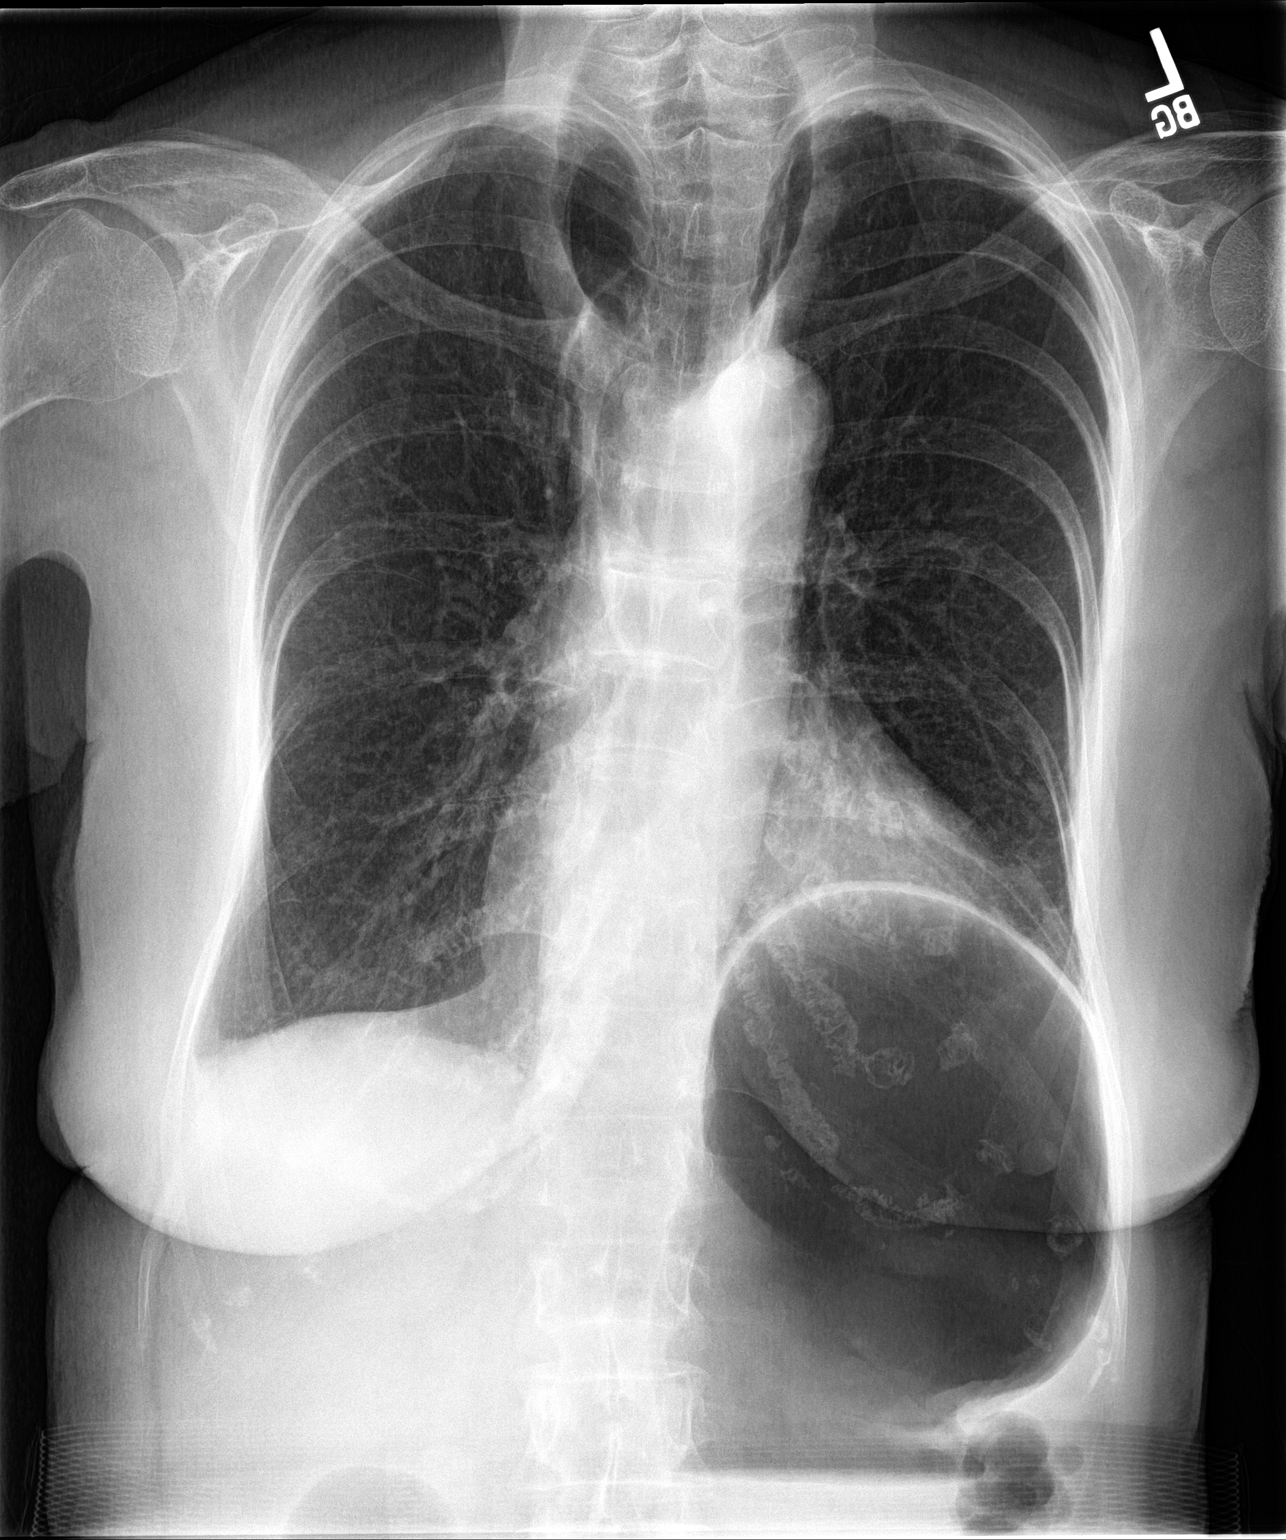

[chest lat]
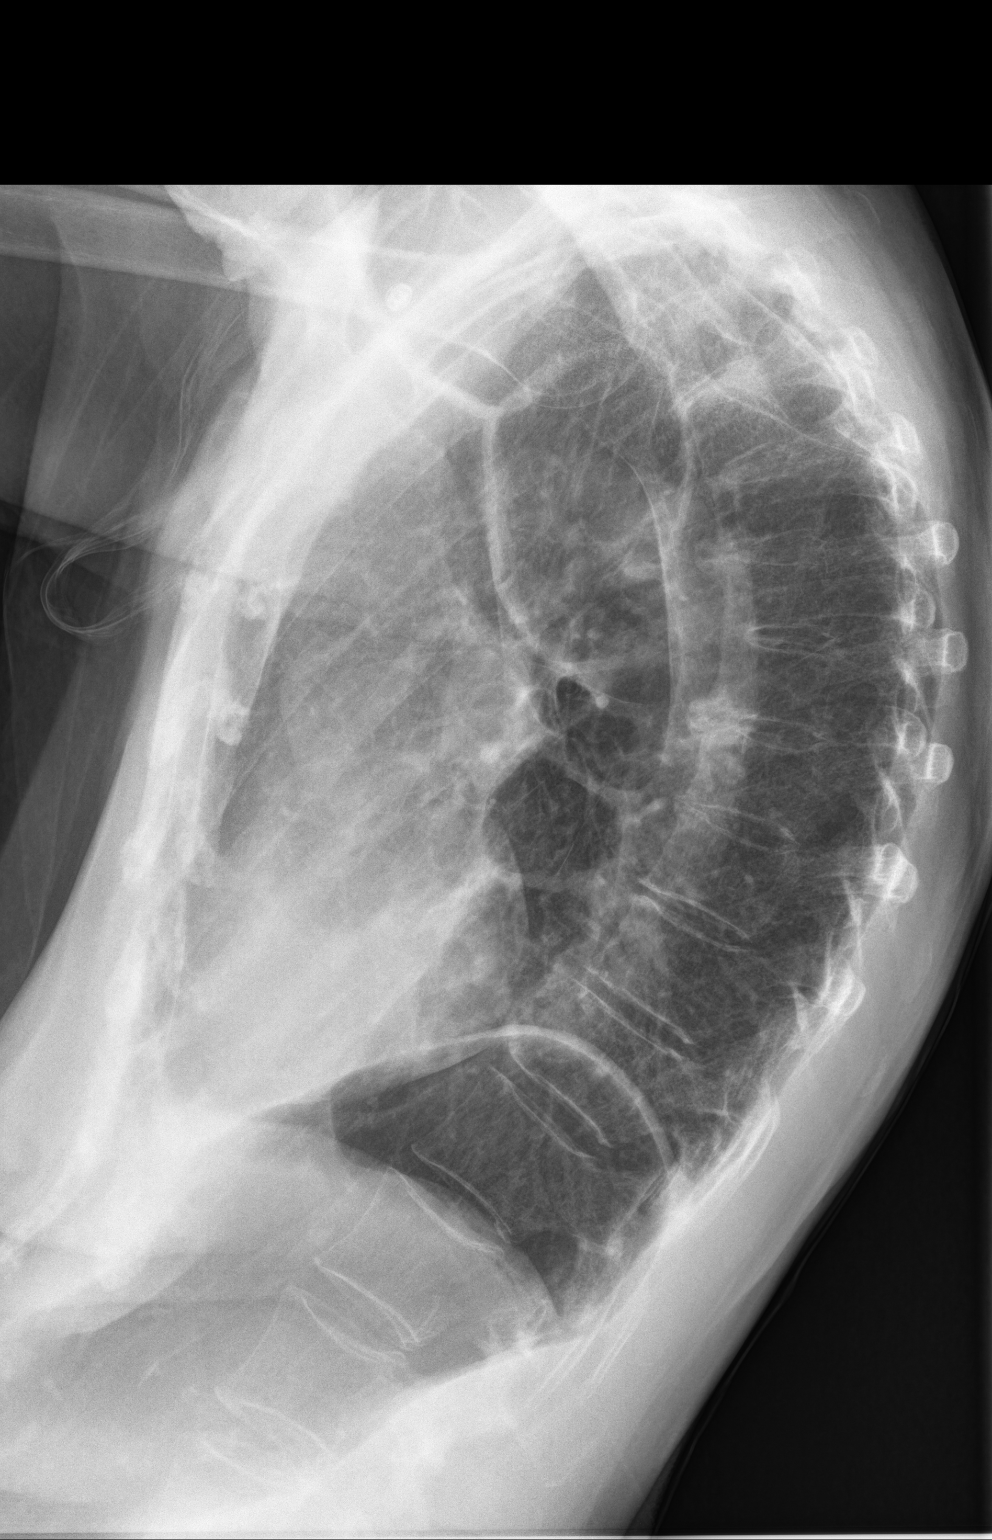

[2 of 2 positions shown; findings below may reference images not displayed]

FINDINGS: Chronic marked esophageal distention with gas, especially the
thoracic inlet, displacing the trachea. The stomach is also gas
dilated with fluid level.

Elevation of the left diaphragm, likely from gastric distention.

There is no edema, consolidation, effusion, or pneumothorax. Normal
heart size. Negative aortic contours.

Partial segmentation anomaly in the upper thoracic spine with
accelerated degenerative disc disease around the fixed level.
IMPRESSION: 1. No active cardiopulmonary disease.
2. Chronic marked distension of the esophagus, reference esophagram
04/13/2015.

## 2017-05-21 ENCOUNTER — Telehealth (INDEPENDENT_AMBULATORY_CARE_PROVIDER_SITE_OTHER): Payer: Self-pay | Admitting: Internal Medicine

## 2017-05-21 NOTE — Telephone Encounter (Signed)
Patient called, she is having problems with her feeding tube.  She would like to speak to Dr. Karilyn Cotaehman or Babette Relicammy.  [phone]

## 2017-05-21 NOTE — Telephone Encounter (Signed)
I talked with the patient. She gave the following information. Her first tube was in 2016 and it lasted for 8 months. Her second tube was placed 2017 and she has had it for 14 months. She is having trouble with the tube. In the last month she has experienced nausea, 1 day last week it was so bad she had to go to bed, and at 1 point she had to stand up. Patient states that she takes Meclizine for this as it helps her dizziness it should help her nausea,this was per the patient. Redness is around the stoma, the tube has begun to be blocked, and she has to call for help to get it to flush., she has had bleeding around the tube. Patient says that she has not had any fever. Patient feels that the tube may need to be replaced.  I explained to the patient that Dr,.Rehman would be returning to the office 05/22/2017 and I would share all this information with him. I suggested that she go to the ED for evaluation or see her PCP, especially if she started running a fever. Ms. Marca AnconaGilmore says that she would do so , however she will wait until I address this with Dr.Rehman. Call back number is [phone].

## 2017-05-23 ENCOUNTER — Encounter (HOSPITAL_COMMUNITY): Payer: Self-pay

## 2017-05-23 ENCOUNTER — Ambulatory Visit (HOSPITAL_COMMUNITY)
Admission: RE | Admit: 2017-05-23 | Discharge: 2017-05-23 | Disposition: A | Discharge status: Home / Self Care | Payer: Medicare Other | Source: Ambulatory Visit | Attending: Internal Medicine | Admitting: Internal Medicine

## 2017-05-23 ENCOUNTER — Telehealth (INDEPENDENT_AMBULATORY_CARE_PROVIDER_SITE_OTHER): Payer: Self-pay | Admitting: *Deleted

## 2017-05-23 ENCOUNTER — Ambulatory Visit (HOSPITAL_COMMUNITY): Admit: 2017-05-23 | Payer: Medicare Other | Admitting: Internal Medicine

## 2017-05-23 ENCOUNTER — Encounter (HOSPITAL_COMMUNITY)
Admission: RE | Disposition: A | Discharge status: Home / Self Care | Payer: Self-pay | Source: Ambulatory Visit | Attending: Internal Medicine

## 2017-05-23 ENCOUNTER — Other Ambulatory Visit (INDEPENDENT_AMBULATORY_CARE_PROVIDER_SITE_OTHER): Payer: Self-pay | Admitting: *Deleted

## 2017-05-23 ENCOUNTER — Encounter (HOSPITAL_COMMUNITY): Payer: Self-pay | Admitting: *Deleted

## 2017-05-23 DIAGNOSIS — K9423 Gastrostomy malfunction: Secondary | ICD-10-CM | POA: Diagnosis present

## 2017-05-23 DIAGNOSIS — K224 Dyskinesia of esophagus: Secondary | ICD-10-CM | POA: Diagnosis not present

## 2017-05-23 DIAGNOSIS — R627 Adult failure to thrive: Secondary | ICD-10-CM | POA: Diagnosis not present

## 2017-05-23 DIAGNOSIS — G629 Polyneuropathy, unspecified: Secondary | ICD-10-CM | POA: Insufficient documentation

## 2017-05-23 DIAGNOSIS — K228 Other specified diseases of esophagus: Secondary | ICD-10-CM | POA: Insufficient documentation

## 2017-05-23 DIAGNOSIS — Z79899 Other long term (current) drug therapy: Secondary | ICD-10-CM | POA: Diagnosis not present

## 2017-05-23 DIAGNOSIS — Z431 Encounter for attention to gastrostomy: Secondary | ICD-10-CM | POA: Diagnosis not present

## 2017-05-23 HISTORY — PX: ESOPHAGOGASTRODUODENOSCOPY: SHX5428

## 2017-05-23 HISTORY — PX: PEG PLACEMENT: SHX5437

## 2017-05-23 SURGERY — EGD (ESOPHAGOGASTRODUODENOSCOPY)
Anesthesia: Moderate Sedation

## 2017-05-23 SURGERY — REPLACEMENT, PEG TUBE, WITHOUT ENDOSCOPY
Anesthesia: LOCAL

## 2017-05-23 MED ORDER — MIDAZOLAM HCL 5 MG/5ML IJ SOLN
INTRAMUSCULAR | Status: AC
  Filled 2017-05-23: qty 10

## 2017-05-23 MED ORDER — LIDOCAINE VISCOUS 2 % MT SOLN
OROMUCOSAL | Status: AC
  Filled 2017-05-23: qty 15

## 2017-05-23 MED ORDER — MIDAZOLAM HCL 5 MG/5ML IJ SOLN
INTRAMUSCULAR | Status: DC | PRN
  Administered 2017-05-23: 1 mg via INTRAVENOUS

## 2017-05-23 MED ORDER — SODIUM CHLORIDE 0.9 % IV SOLN
INTRAVENOUS | Status: DC
  Administered 2017-05-23: 1000 mL via INTRAVENOUS

## 2017-05-23 MED ORDER — MIDAZOLAM HCL 5 MG/5ML IJ SOLN
INTRAMUSCULAR | Status: DC | PRN
  Administered 2017-05-23 (×2): 1 mg via INTRAVENOUS

## 2017-05-23 MED ORDER — LIDOCAINE VISCOUS 2 % MT SOLN
OROMUCOSAL | Status: DC | PRN
  Administered 2017-05-23: 1 via OROMUCOSAL

## 2017-05-23 MED ORDER — MEPERIDINE HCL 50 MG/ML IJ SOLN
INTRAMUSCULAR | Status: DC | PRN
  Administered 2017-05-23: 25 mg via INTRAVENOUS

## 2017-05-23 MED ORDER — MEPERIDINE HCL 50 MG/ML IJ SOLN
INTRAMUSCULAR | Status: AC
  Filled 2017-05-23: qty 1

## 2017-05-23 NOTE — H&P (Signed)
Nancy Horn is an 83 y.o. female.   Chief Complaint: For PEG change. HPI: Patient is an 83 year old Caucasian female who has severe esophageal motility disorder and has been on tube feeding for several months. Tube was changed in May last year. Balloon replacement tube was tried but it did not work. She therefore had PEG placement and endoscopy. This tube is not clogging off frequently. She has been flushing the tube with water and [employer]. She has requested to be changed. She is maintaining her weight. She is unable to swallow liquids or solids even her saliva. She has to spitted out frequently. She is maintaining her weight. She lives at home and drives locally.  Past Medical History:  Diagnosis Date  . Candida esophagitis (HCC) APR 2016  . Dysphagia   . Helicobacter pylori gastritis APR 2016   treated with amox and biaxin  . Neuropathy   . Osteoporosis   . Vertigo     Past Surgical History:  Procedure Laterality Date  . ESOPHAGEAL DILATION N/A 02/19/2015   Procedure: ESOPHAGEAL DILATION;  Surgeon: West BaliSandi L Fields, MD;  Location: [facility];  Service: Endoscopy;  Laterality: N/A;  . ESOPHAGOGASTRODUODENOSCOPY N/A 02/19/2015   Dr.Fields- dilated proximal esophagus, possible mid- esophageal web, mild non-erosive gastritis bx= chronic gastritis with focal activity and hpylori  . ESOPHAGOGASTRODUODENOSCOPY N/A 04/05/2016   Procedure: ESOPHAGOGASTRODUODENOSCOPY (EGD);  Surgeon: Malissa HippoNajeeb U Givanni Staron, MD;  Location: [facility];  Service: Endoscopy;  Laterality: N/A;  325  . ESOPHAGOGASTRODUODENOSCOPY (EGD) WITH PROPOFOL N/A 07/19/2015   Dr.Jin Shockley- dilated esophageal body with pooling of secretions, antral gastritis, 49F gtube placed percutaneously  . PARTIAL HYSTERECTOMY    . PEG PLACEMENT N/A 07/19/2015   Dr.Rhoderick Farrel - 49F gtube, 71F jejunal feeding tube could only be advanced into the second part of the duodenum.  Marland Kitchen. PEG PLACEMENT N/A 04/04/2016   Procedure: PERCUTANEOUS ENDOSCOPIC GASTROSTOMY  (PEG) REPLACEMENT;  Surgeon: Malissa HippoNajeeb U Trayveon Beckford, MD;  Location: [facility];  Service: Endoscopy;  Laterality: N/A;  . PEG PLACEMENT N/A 04/05/2016   Procedure: PERCUTANEOUS ENDOSCOPIC GASTROSTOMY (PEG) REPLACEMENT;  Surgeon: Malissa HippoNajeeb U Trung Wenzl, MD;  Location: [facility];  Service: Endoscopy;  Laterality: N/A;    Family History  Problem Relation Age of Onset  . Angina Mother   . Hypothyroidism Mother   . Melanoma Mother   . Heart failure Father   . Melanoma Sister   . Prostate cancer Brother   . Colon cancer Neg Hx    Social History:  reports that she has never smoked. She has never used smokeless tobacco. She reports that she does not drink alcohol or use drugs.  Allergies: No Known Allergies  Medications Prior to Admission  Medication Sig Dispense Refill  . ALPRAZolam (XANAX) 0.5 MG tablet Take 1 tablet (0.5 mg total) by mouth 3 (three) times daily as needed for anxiety.  0  . calcium carbonate, dosed in mg elemental calcium, 1250 (500 CA) MG/5ML Place 5 mLs (500 mg of elemental calcium total) into feeding tube 2 (two) times daily with a meal. 450 mL   . gabapentin (NEURONTIN) 100 MG capsule Take 600 mg by mouth at bedtime.   11  . magnesium citrate SOLN 1 cc twice daily    . meclizine (ANTIVERT) 25 MG tablet Take 25 mg by mouth at bedtime.    . Nutritional Supplements (FEEDING SUPPLEMENT, JEVITY 1.2 CAL,) LIQD Place 1,000 mLs into feeding tube daily.  0  . Potassium Chloride 40 MEQ/15ML (20%) SOLN Take  15 mLs by mouth 2 (two) times daily.    . ranitidine (ZANTAC) 15 MG/ML syrup Take 150 mg by mouth 2 (two) times daily.     . silver nitrate applicators 75-25 % applicator Apply topically daily. Apply silver nitrate solution to granulation tissue 3 times a week as directed until it is completely resolved. 100 each 0    No results found for this or any previous visit (from the past 48 hour(s)). No results found.  ROS  Blood pressure (!) 105/49, pulse 66, temperature 97.6 F (36.4  C), temperature source Axillary, resp. rate 20, height 5\' 5"  (1.651 m), weight 120 lb (54.4 kg), SpO2 90 %. Physical Exam  Constitutional: She appears well-developed and well-nourished.  HENT:  Mouth/Throat: Oropharynx is clear and moist.  Eyes: Conjunctivae are normal. No scleral icterus.  Neck: No thyromegaly present.  Cardiovascular: Normal rate and regular rhythm.   Murmur (faint SEM at left upper sternal border.) heard. Respiratory: Effort normal and breath sounds normal.  GI:  Abdomen is symmetrical with PEG tube in place. Tube has variable caliber with debridement. There is small amount of admission tissue at G site with peristomal erythema but no ulceration. Abdomen is soft and nontender without organomegaly or masses.  Musculoskeletal: She exhibits no edema.  Lymphadenopathy:    She has no cervical adenopathy.  Neurological: She is alert.  Skin: Skin is warm and dry.     Assessment/Plan Malfunctioning PEG tube. EGD with PEG change.  Lionel December, MD 05/23/2017, 2:25 PM

## 2017-05-23 NOTE — Telephone Encounter (Signed)
PEG replacement sch'd for today, patient aware

## 2017-05-23 NOTE — Telephone Encounter (Signed)
Mrs. Nancy Horn called the office on 05/21/2017. She states that she has been having problems with her Peg Tube. Her first tube was put in 2016 and it lasted for 8 months.Then it was replaced with a second one that has lasted for 14 months. It is leaking, redness around stoma, some bleeding around the tube, tube has begun to block more often per patient, and it is hard to push fluid to get in to unblock. Patient complains of nausea , she this has been ongoing for several months. At one point she had to go to bed. She says that she is taking Meclizine three times a day, its her dizzy medication but it helps with the nausea.  It was explained that Dr.Rehman would return to the office on 05/22/2017 and I would discuss this with him. Per Dr.Rehman call in Mycolog -2 apply to the affected area twice a day 30 grams. This was called to the [facility] this morning and they will deliver to the patient. They were also made aware that if insurance did not pay for this they may do the two ingredients separately.   Dr.Rehman also stated to arrange for a tube change. Patient was called and made aware, she ask that they do a tube with a cut off. She was advised that Luster LandsbergAnn Tilley would work on this and either Dewayne Hatchnn or the hospital would call her with that information.

## 2017-05-23 NOTE — Op Note (Signed)
[facility] Patient Name: Nancy Horn Procedure Date: 05/23/2017 1:57 PM MRN: [id number] Date of Birth: 1925-05-01 Attending MD: Lionel December , MD CSN: [id number] Age: 83 Admit Type: Outpatient Procedure:                Upper GI endoscopy Indications:              Replace PEG tube due to malfunctioning gastrostomy                            tube Providers:                Lionel December, MD, Nena Polio, RN, Edrick Kins, RN Referring MD:             Oneal Deputy. Juanetta Gosling, MD Medicines:                Lidocaine spray, Meperidine 25 mg IV, Midazolam 3                            mg IV Complications:            No immediate complications. Estimated Blood Loss:     Estimated blood loss: none. Procedure:                Pre-Anesthesia Assessment:                           - Prior to the procedure, a History and Physical                            was performed, and patient medications and                            allergies were reviewed. The patient's tolerance of                            previous anesthesia was also reviewed. The risks                            and benefits of the procedure and the sedation                            options and risks were discussed with the patient.                            All questions were answered, and informed consent                            was obtained. Prior Anticoagulants: The patient has                            taken no previous anticoagulant or antiplatelet                            agents. ASA Grade Assessment: III - A patient with  severe systemic disease. After reviewing the risks                            and benefits, the patient was deemed in                            satisfactory condition to undergo the procedure.                           After obtaining informed consent, the endoscope was                            passed under direct vision. Throughout the   procedure, the patient's blood pressure, pulse, and                            oxygen saturations were monitored continuously. The                            EG-[id number] ([id number]) scope was introduced through the                            mouth, and advanced to the pylorus. The upper GI                            endoscopy was accomplished without difficulty. The                            patient tolerated the procedure well. Scope In: 2:38:17 PM Scope Out: 2:57:46 PM Total Procedure Duration: 0 hours 19 minutes 29 seconds  Findings:      The lumen of the proximal esophagus and mid esophagus was moderately       dilated.      Fluid was found in the mid esophagus.      A gastric tube was found in the gastric body. Removal was accomplished       with a snare after the tube was cut on the ooutside.      The exam of the stomach was otherwise normal.      035 guidewire was passed into gastric lumen via existing fistula. It was       caught with snare and scope was withdrawn. [employer] [place]       gastrostomy advanced over the guidewire. As the tapered end appeared at       gastrostomy site tube was pulled until it was felt to be against gastric       mucosa. Tube position wa confirmed by passing the scope. On the outside       tube was secured by placing bolster around it. It was cut to desired       length and connected to adapter      gastrostomy site was cleaned and covered      Examination of the duodenum was not performed. Impression:               - Dilation in the proximal and mid esophagus with  frothy liquid in the mid esophagus.                           - Malfunctioning gastrostomy tube was removed and                            replaced [facility] [place] [facility] via gastrostomy tube via                            existing fistula.                           - duodenum not examined. Moderate Sedation:      Moderate (conscious) sedation was administered by the  endoscopy nurse       and supervised by the endoscopist. The following parameters were       monitored: oxygen saturation, heart rate, blood pressure, CO2       capnography and response to care. Total physician intraservice time was       24 minutes. Recommendation:           - Patient has a contact number available for                            emergencies. The signs and symptoms of potential                            delayed complications were discussed with the                            patient. Return to normal activities tomorrow.                            Written discharge instructions were provided to the                            patient.                           - Resume previous diet today.                           - Continue present medications.                           - Flush tube with 1 tablespoon full of vinegar                            daily.                           - Pass brush through the tube daily or 3 times a                            week. Procedure Code(s):        --- Professional ---  43247, 52, Esophagogastroduodenoscopy, flexible,                            transoral; with removal of foreign body(s)                           99152, Moderate sedation services provided by the                            same physician or other qualified health care                            professional performing the diagnostic or                            therapeutic service that the sedation supports,                            requiring the presence of an independent trained                            observer to assist in the monitoring of the                            patient's level of consciousness and physiological                            status; initial 15 minutes of intraservice time,                            patient age 36 years or older                           [phone], Moderate sedation services; each additional                             15 minutes intraservice time Diagnosis Code(s):        --- Professional ---                           K22.8, Other specified diseases of esophagus                           Z43.1, Encounter for attention to gastrostomy                           K94.23, Gastrostomy malfunction CPT copyright 2016 American Medical Association. All rights reserved. The codes documented in this report are preliminary and upon coder review may  be revised to meet current compliance requirements. Lionel December, MD Lionel December, MD 05/23/2017 3:27:08 PM This report has been signed electronically. Number of Addenda: 0

## 2017-05-23 NOTE — Discharge Instructions (Signed)
Resume gastric feeding as before. Resume usual medications. Flush gastrostomy tube with tablespoonful of vinegar daily. Pass brush through gastrostomy tube daily or 3 times a week as directed. Keep gastrostomy tube dry. Call if you have any questions.  Gastrointestinal Endoscopy, Care After Refer to this sheet in the next few weeks. These instructions provide you with information about caring for yourself after your procedure. Your health care provider may also give you more specific instructions. Your treatment has been planned according to current medical practices, but problems sometimes occur. Call your health care provider if you have any problems or questions after your procedure. What can I expect after the procedure? After your procedure, it is common to feel:  Bloated.  Soreness in your throat.  Sleepy.  Follow these instructions at home:  Do not drive for 24 hours if you received a if you received a medicine to help you relax (sedative).  Take over-the-counter and prescription medicines only as told by your health care provider.  If you feel bloated, try going for a walk. Walking may help the feeling go away.  If your throat is sore, try gargling with salt water. Get help right away if:  You have severe nausea or vomiting.  You have severe abdominal pain, abdominal cramps that last longer than 6 hours, or abdominal swelling.  You have severe shoulder or back pain.  You have trouble swallowing.  You have shortness of breath, your breathing is shallow, or you breathing is faster than normal.  You have a fever.  Your heart is beating very fast.  You vomit blood or material that looks like coffee grounds.  You have bloody, black, or tarry stools.

## 2017-05-28 ENCOUNTER — Encounter (HOSPITAL_COMMUNITY): Payer: Self-pay | Admitting: Internal Medicine

## 2017-07-03 ENCOUNTER — Other Ambulatory Visit (INDEPENDENT_AMBULATORY_CARE_PROVIDER_SITE_OTHER): Payer: Self-pay | Admitting: Internal Medicine

## 2017-09-24 ENCOUNTER — Telehealth (INDEPENDENT_AMBULATORY_CARE_PROVIDER_SITE_OTHER): Payer: Self-pay | Admitting: Internal Medicine

## 2017-09-24 NOTE — Telephone Encounter (Signed)
Patient called, is having some issues she would like to discuss with the nurse to give to Dr. Karilyn Cotaehman, no details.  [phone]

## 2017-09-28 NOTE — Telephone Encounter (Signed)
Patient states that on Saturday , Sunday, and Monday right after she had lunch she experienced hard chills and nausea. She had to go to bed with the electric blanket. This has left her weak. Yesterday and today she has felt better.  Just wanted Dr.Rehman to know and see if he had any thoughts or recommendations.  [phone].

## 2017-09-30 NOTE — Telephone Encounter (Signed)
Patient feels fine. She may have had brief viral illness. No further workup unless symptoms relapse.

## 2018-02-13 ENCOUNTER — Other Ambulatory Visit (INDEPENDENT_AMBULATORY_CARE_PROVIDER_SITE_OTHER): Payer: Self-pay | Admitting: Internal Medicine

## 2018-03-16 ENCOUNTER — Other Ambulatory Visit (INDEPENDENT_AMBULATORY_CARE_PROVIDER_SITE_OTHER): Payer: Self-pay | Admitting: Internal Medicine

## 2018-03-20 ENCOUNTER — Encounter (HOSPITAL_COMMUNITY)
Admission: RE | Disposition: A | Discharge status: Home / Self Care | Payer: Self-pay | Source: Ambulatory Visit | Attending: Internal Medicine

## 2018-03-20 ENCOUNTER — Ambulatory Visit (HOSPITAL_COMMUNITY)
Admission: RE | Admit: 2018-03-20 | Discharge: 2018-03-20 | Disposition: A | Discharge status: Home / Self Care | Payer: Medicare Other | Source: Ambulatory Visit | Attending: Internal Medicine | Admitting: Internal Medicine

## 2018-03-20 ENCOUNTER — Telehealth (INDEPENDENT_AMBULATORY_CARE_PROVIDER_SITE_OTHER): Payer: Self-pay | Admitting: Internal Medicine

## 2018-03-20 HISTORY — PX: PEG PLACEMENT: SHX5437

## 2018-03-20 SURGERY — REPLACEMENT, PEG TUBE, WITHOUT ENDOSCOPY
Anesthesia: LOCAL

## 2018-03-20 NOTE — Progress Notes (Signed)
Ms. Mclamb is a 84 year old Caucasian female who has severe esophageal motility disorder resulting in inability to swallow any kind of foods including liquids as well as saliva.  Her tube was changed endoscopically in July last year. Patient called office yesterday afternoon stating that she was having problems with the 2 and she thought it need to be changed. Patient was advised to come to endoscopy. Patient states she has noted yellowish fluid from around the tube.  She complains of soreness at gastrostomy tube site.  Denies abdominal pain fever or chills.  Patient states she is drooling all the time.  It has gotten much worse than before.  She has to change her clothes all the time. Patient is feeding herself via gastrostomy tube.  Patient has non-balloon gastrostomy tube in place. There is peristomal erythema and superficial excoriation. Bolster is at least 2 cm away from the skin.  Tube moves freely in and out.  Rest of her abdomen is soft.  Patient has peristomal inflammation due to good sedation of gastric contents around the G-tube.  Bolster was moved close to the skin to about 2.5 cm.  There was no more leakage. Site was covered with drain sponge.  Vision wants to know if anything could be done to decrease salivation so that drooling would become manageable. She is not a candidate for anti-cholinergic therapy.  I would check with the radiation oncologist if some of her saliva glands could be destroyed to decrease saliva production.  Patient's friend Ms. Darien Ramus is with her.  I showed it to her how to move the bolster back in case it is needed.  We will try to obtain non-balloon replacement tube for future exchange.

## 2018-03-20 NOTE — Telephone Encounter (Signed)
Patient left message stating she was waiting for someone to call her back

## 2018-03-20 NOTE — Progress Notes (Signed)
Dr. Karilyn Cota in to examine patient. PEG Tube per Dr. Karilyn Cota does not need to be changed.  Dr. Karilyn Cota "tightened the bolster where it had loosened".  Patient friend Darien Ramus present for evaluation with patient and Dr. Karilyn Cota.

## 2018-03-21 NOTE — Telephone Encounter (Signed)
Patient went to Endo on 03/20/2018 and the tube was tightened ,per [facility] in Endo.

## 2018-03-25 ENCOUNTER — Encounter (HOSPITAL_COMMUNITY): Payer: Self-pay | Admitting: Internal Medicine

## 2018-04-30 ENCOUNTER — Other Ambulatory Visit: Payer: Self-pay

## 2018-04-30 ENCOUNTER — Encounter (HOSPITAL_COMMUNITY): Payer: Self-pay | Admitting: Emergency Medicine

## 2018-04-30 ENCOUNTER — Emergency Department (HOSPITAL_COMMUNITY): Payer: Medicare Other

## 2018-04-30 ENCOUNTER — Inpatient Hospital Stay (HOSPITAL_COMMUNITY)
Admission: EM | Admit: 2018-04-30 | Discharge: 2018-05-13 | DRG: 871 | Disposition: E | Payer: Medicare Other | Attending: Pulmonary Disease | Admitting: Pulmonary Disease

## 2018-04-30 ENCOUNTER — Inpatient Hospital Stay (HOSPITAL_COMMUNITY): Payer: Medicare Other

## 2018-04-30 DIAGNOSIS — R7989 Other specified abnormal findings of blood chemistry: Secondary | ICD-10-CM | POA: Diagnosis present

## 2018-04-30 DIAGNOSIS — R740 Nonspecific elevation of levels of transaminase and lactic acid dehydrogenase [LDH]: Secondary | ICD-10-CM | POA: Diagnosis present

## 2018-04-30 DIAGNOSIS — I480 Paroxysmal atrial fibrillation: Secondary | ICD-10-CM | POA: Diagnosis present

## 2018-04-30 DIAGNOSIS — R739 Hyperglycemia, unspecified: Secondary | ICD-10-CM | POA: Diagnosis present

## 2018-04-30 DIAGNOSIS — R1314 Dysphagia, pharyngoesophageal phase: Secondary | ICD-10-CM | POA: Diagnosis present

## 2018-04-30 DIAGNOSIS — Z808 Family history of malignant neoplasm of other organs or systems: Secondary | ICD-10-CM | POA: Diagnosis not present

## 2018-04-30 DIAGNOSIS — E872 Acidosis, unspecified: Secondary | ICD-10-CM | POA: Diagnosis present

## 2018-04-30 DIAGNOSIS — E440 Moderate protein-calorie malnutrition: Secondary | ICD-10-CM | POA: Diagnosis present

## 2018-04-30 DIAGNOSIS — J9601 Acute respiratory failure with hypoxia: Secondary | ICD-10-CM | POA: Diagnosis present

## 2018-04-30 DIAGNOSIS — I361 Nonrheumatic tricuspid (valve) insufficiency: Secondary | ICD-10-CM | POA: Diagnosis not present

## 2018-04-30 DIAGNOSIS — Z7189 Other specified counseling: Secondary | ICD-10-CM | POA: Diagnosis not present

## 2018-04-30 DIAGNOSIS — M810 Age-related osteoporosis without current pathological fracture: Secondary | ICD-10-CM | POA: Diagnosis present

## 2018-04-30 DIAGNOSIS — I340 Nonrheumatic mitral (valve) insufficiency: Secondary | ICD-10-CM | POA: Diagnosis not present

## 2018-04-30 DIAGNOSIS — K224 Dyskinesia of esophagus: Secondary | ICD-10-CM | POA: Diagnosis present

## 2018-04-30 DIAGNOSIS — Z66 Do not resuscitate: Secondary | ICD-10-CM | POA: Diagnosis not present

## 2018-04-30 DIAGNOSIS — Z8042 Family history of malignant neoplasm of prostate: Secondary | ICD-10-CM

## 2018-04-30 DIAGNOSIS — K9423 Gastrostomy malfunction: Secondary | ICD-10-CM | POA: Diagnosis not present

## 2018-04-30 DIAGNOSIS — Z79899 Other long term (current) drug therapy: Secondary | ICD-10-CM

## 2018-04-30 DIAGNOSIS — A419 Sepsis, unspecified organism: Secondary | ICD-10-CM | POA: Diagnosis present

## 2018-04-30 DIAGNOSIS — F419 Anxiety disorder, unspecified: Secondary | ICD-10-CM | POA: Diagnosis present

## 2018-04-30 DIAGNOSIS — I959 Hypotension, unspecified: Secondary | ICD-10-CM | POA: Diagnosis present

## 2018-04-30 DIAGNOSIS — I351 Nonrheumatic aortic (valve) insufficiency: Secondary | ICD-10-CM | POA: Diagnosis not present

## 2018-04-30 DIAGNOSIS — Z931 Gastrostomy status: Secondary | ICD-10-CM | POA: Diagnosis not present

## 2018-04-30 DIAGNOSIS — E871 Hypo-osmolality and hyponatremia: Secondary | ICD-10-CM | POA: Diagnosis present

## 2018-04-30 DIAGNOSIS — J690 Pneumonitis due to inhalation of food and vomit: Secondary | ICD-10-CM | POA: Diagnosis present

## 2018-04-30 DIAGNOSIS — J9690 Respiratory failure, unspecified, unspecified whether with hypoxia or hypercapnia: Secondary | ICD-10-CM

## 2018-04-30 DIAGNOSIS — G629 Polyneuropathy, unspecified: Secondary | ICD-10-CM | POA: Diagnosis present

## 2018-04-30 DIAGNOSIS — I483 Typical atrial flutter: Secondary | ICD-10-CM | POA: Diagnosis present

## 2018-04-30 DIAGNOSIS — R000 Tachycardia, unspecified: Secondary | ICD-10-CM | POA: Diagnosis present

## 2018-04-30 DIAGNOSIS — Z6821 Body mass index (BMI) 21.0-21.9, adult: Secondary | ICD-10-CM | POA: Diagnosis not present

## 2018-04-30 DIAGNOSIS — Z515 Encounter for palliative care: Secondary | ICD-10-CM | POA: Diagnosis not present

## 2018-04-30 DIAGNOSIS — R7401 Elevation of levels of liver transaminase levels: Secondary | ICD-10-CM | POA: Diagnosis present

## 2018-04-30 DIAGNOSIS — J9600 Acute respiratory failure, unspecified whether with hypoxia or hypercapnia: Secondary | ICD-10-CM | POA: Diagnosis present

## 2018-04-30 LAB — CBC WITH DIFFERENTIAL/PLATELET
Basophils Absolute: 0 10*3/uL (ref 0.0–0.1)
Basophils Relative: 0 %
Eosinophils Absolute: 0 10*3/uL (ref 0.0–0.7)
Eosinophils Relative: 0 %
HEMATOCRIT: 42.2 % (ref 36.0–46.0)
HEMOGLOBIN: 13.7 g/dL (ref 12.0–15.0)
LYMPHS ABS: 0.7 10*3/uL (ref 0.7–4.0)
LYMPHS PCT: 8 %
MCH: 32.2 pg (ref 26.0–34.0)
MCHC: 32.5 g/dL (ref 30.0–36.0)
MCV: 99.3 fL (ref 78.0–100.0)
Monocytes Absolute: 0.4 10*3/uL (ref 0.1–1.0)
Monocytes Relative: 5 %
NEUTROS ABS: 7.1 10*3/uL (ref 1.7–7.7)
Neutrophils Relative %: 87 %
Platelets: 139 10*3/uL — ABNORMAL LOW (ref 150–400)
RBC: 4.25 MIL/uL (ref 3.87–5.11)
RDW: 12.8 % (ref 11.5–15.5)
WBC: 8.1 10*3/uL (ref 4.0–10.5)

## 2018-04-30 LAB — COMPREHENSIVE METABOLIC PANEL
ALT: 32 U/L (ref 14–54)
ANION GAP: 10 (ref 5–15)
AST: 58 U/L — AB (ref 15–41)
Albumin: 3.2 g/dL — ABNORMAL LOW (ref 3.5–5.0)
Alkaline Phosphatase: 113 U/L (ref 38–126)
BILIRUBIN TOTAL: 0.6 mg/dL (ref 0.3–1.2)
BUN: 30 mg/dL — ABNORMAL HIGH (ref 6–20)
CHLORIDE: 93 mmol/L — AB (ref 101–111)
CO2: 27 mmol/L (ref 22–32)
Calcium: 9.3 mg/dL (ref 8.9–10.3)
Creatinine, Ser: 0.93 mg/dL (ref 0.44–1.00)
GFR calc Af Amer: >60 mL/min (ref >60)
GFR, EST NON AFRICAN AMERICAN: 53 mL/min — AB (ref >60)
Glucose, Bld: 281 mg/dL — ABNORMAL HIGH (ref 65–99)
POTASSIUM: 3.9 mmol/L (ref 3.5–5.1)
Sodium: 130 mmol/L — ABNORMAL LOW (ref 135–145)
TOTAL PROTEIN: 7.2 g/dL (ref 6.5–8.1)

## 2018-04-30 LAB — URINALYSIS, ROUTINE W REFLEX MICROSCOPIC
Bacteria, UA: NONE SEEN
Bilirubin Urine: NEGATIVE
HGB URINE DIPSTICK: NEGATIVE
Ketones, ur: NEGATIVE mg/dL
LEUKOCYTES UA: NEGATIVE
NITRITE: NEGATIVE
PH: 7 (ref 5.0–8.0)
Protein, ur: 30 mg/dL — AB
SPECIFIC GRAVITY, URINE: 1.018 (ref 1.005–1.030)

## 2018-04-30 LAB — I-STAT CG4 LACTIC ACID, ED
LACTIC ACID, VENOUS: 3.89 mmol/L — AB (ref 0.5–1.9)
Lactic Acid, Venous: 5.09 mmol/L (ref 0.5–1.9)

## 2018-04-30 LAB — BRAIN NATRIURETIC PEPTIDE: B Natriuretic Peptide: 285 pg/mL — ABNORMAL HIGH (ref 0.0–100.0)

## 2018-04-30 LAB — LIPASE, BLOOD: LIPASE: 25 U/L (ref 11–51)

## 2018-04-30 MED ORDER — VANCOMYCIN HCL IN DEXTROSE 1-5 GM/200ML-% IV SOLN
1000.0000 mg | Freq: Once | INTRAVENOUS | Status: AC
  Administered 2018-04-30: 1000 mg via INTRAVENOUS
  Filled 2018-04-30: qty 200

## 2018-04-30 MED ORDER — SODIUM CHLORIDE 0.9 % IV BOLUS
1000.0000 mL | Freq: Once | INTRAVENOUS | Status: AC
  Administered 2018-04-30: 1000 mL via INTRAVENOUS

## 2018-04-30 MED ORDER — ACETAMINOPHEN 650 MG RE SUPP
650.0000 mg | Freq: Once | RECTAL | Status: AC
  Administered 2018-04-30: 650 mg via RECTAL
  Filled 2018-04-30: qty 1

## 2018-04-30 MED ORDER — METHYLPREDNISOLONE SODIUM SUCC 125 MG IJ SOLR
125.0000 mg | Freq: Once | INTRAMUSCULAR | Status: AC
  Administered 2018-04-30: 125 mg via INTRAVENOUS
  Filled 2018-04-30: qty 2

## 2018-04-30 MED ORDER — VANCOMYCIN HCL IN DEXTROSE 750-5 MG/150ML-% IV SOLN
750.0000 mg | INTRAVENOUS | Status: DC
  Administered 2018-05-01 – 2018-05-03 (×3): 750 mg via INTRAVENOUS
  Filled 2018-04-30 (×5): qty 150

## 2018-04-30 MED ORDER — IOPAMIDOL (ISOVUE-370) INJECTION 76%
100.0000 mL | Freq: Once | INTRAVENOUS | Status: AC | PRN
  Administered 2018-04-30: 100 mL via INTRAVENOUS

## 2018-04-30 MED ORDER — KETOROLAC TROMETHAMINE 15 MG/ML IJ SOLN
15.0000 mg | Freq: Once | INTRAMUSCULAR | Status: AC
  Administered 2018-04-30: 15 mg via INTRAVENOUS
  Filled 2018-04-30: qty 1

## 2018-04-30 MED ORDER — LORAZEPAM 2 MG/ML IJ SOLN
0.5000 mg | Freq: Once | INTRAMUSCULAR | Status: AC | PRN
  Administered 2018-04-30: 0.5 mg via INTRAVENOUS
  Filled 2018-04-30: qty 1

## 2018-04-30 MED ORDER — MAGNESIUM SULFATE 2 GM/50ML IV SOLN
2.0000 g | Freq: Once | INTRAVENOUS | Status: AC
  Administered 2018-05-01: 2 g via INTRAVENOUS
  Filled 2018-04-30: qty 50

## 2018-04-30 MED ORDER — PIPERACILLIN-TAZOBACTAM 3.375 G IVPB 30 MIN
3.3750 g | Freq: Once | INTRAVENOUS | Status: AC
  Administered 2018-04-30: 3.375 g via INTRAVENOUS
  Filled 2018-04-30: qty 50

## 2018-04-30 MED ORDER — PIPERACILLIN-TAZOBACTAM 3.375 G IVPB
3.3750 g | Freq: Three times a day (TID) | INTRAVENOUS | Status: DC
  Administered 2018-05-01 – 2018-05-06 (×16): 3.375 g via INTRAVENOUS
  Filled 2018-04-30 (×16): qty 50

## 2018-04-30 MED ORDER — IPRATROPIUM-ALBUTEROL 0.5-2.5 (3) MG/3ML IN SOLN
3.0000 mL | Freq: Once | RESPIRATORY_TRACT | Status: AC
  Administered 2018-04-30: 3 mL via RESPIRATORY_TRACT
  Filled 2018-04-30: qty 3

## 2018-04-30 NOTE — ED Notes (Addendum)
Critical result. Lactic Acid. 5.09. Dr Jacqulyn BathLong Notified.

## 2018-04-30 NOTE — ED Provider Notes (Signed)
Emergency Department Provider Note   I have reviewed the triage vital signs and the nursing notes.   HISTORY  Chief Complaint Respiratory Distress   HPI Nancy Horn is a 84 y.o. female with PMH of failure to thrive, esophageal dysmotility, and gastritis presents to the emergency department by EMS from home with fever, shortness of breath, and fatigue.  Symptoms began worsening 2 days ago.  The patient reportedly had a fall this morning but did not call EMS.  When her shortness of breath worsened she did call paramedics who report finding her with oxygen sats of 86% on room air.  They gave albuterol and 1 Atrovent in route with some improvement in shortness of breath.  They note the patient to be hot to touch.  No report of vomiting.  Patient denies any chest pain or abdominal pain.  EMS also notes leaking from the patient's G-tube.   Level 5 caveat: Acute respiratory distress  Past Medical History:  Diagnosis Date  . Candida esophagitis (HCC) APR 2016  . Dysphagia   . Helicobacter pylori gastritis APR 2016   treated with amox and biaxin  . Neuropathy   . Osteoporosis   . Vertigo     Patient Active Problem List   Diagnosis Date Noted  . Sinus tachycardia 05/01/2018  . Hyperglycemia 05/01/2018  . Arterial hypotension 05/01/2018  . Sepsis due to undetermined organism (HCC) 2018-01-30  . Lactic acidosis 2018-01-30  . Hyponatremia 2018-01-30  . Elevated AST (SGOT) 2018-01-30  . Acute respiratory failure (HCC) 2018-01-30  . Failure to thrive in adult 05/23/2017  . PEG tube malfunction (HCC) 05/23/2017  . Localized swelling, mass and lump, neck 03/27/2016  . Generalized weakness   . Protein-calorie malnutrition, severe (HCC) 07/15/2015  . Esophageal dysphagia   . Cholelithiasis with choledocholithiasis   . Diarrhea 06/26/2015  . Dehydration 06/26/2015  . Weakness generalized 06/26/2015  . Malnutrition of moderate degree (HCC) 06/26/2015  . Vertigo   . Dysphagia,  pharyngoesophageal phase 01/27/2015    Past Surgical History:  Procedure Laterality Date  . ESOPHAGEAL DILATION N/A 02/19/2015   Procedure: ESOPHAGEAL DILATION;  Surgeon: West BaliSandi L Fields, MD;  [facility]: [facility];  Service: Endoscopy;  Laterality: N/A;  . ESOPHAGOGASTRODUODENOSCOPY N/A 02/19/2015   Dr.Fields- dilated proximal esophagus, possible mid- esophageal web, mild non-erosive gastritis bx= chronic gastritis with focal activity and hpylori  . ESOPHAGOGASTRODUODENOSCOPY N/A 04/05/2016   Procedure: ESOPHAGOGASTRODUODENOSCOPY (EGD);  Surgeon: Malissa HippoNajeeb U Rehman, MD;  Location: [facility];  Service: Endoscopy;  Laterality: N/A;  325  . ESOPHAGOGASTRODUODENOSCOPY N/A 05/23/2017   Procedure: ESOPHAGOGASTRODUODENOSCOPY (EGD);  Surgeon: [name], Najeeb U, MD;  Location: [facility];  Service: Endoscopy;  Laterality: N/A;  125 Peg tube please make sure that it has a cut off  . ESOPHAGOGASTRODUODENOSCOPY (EGD) WITH PROPOFOL N/A 07/19/2015   Dr.Rehman- dilated esophageal body with pooling of secretions, antral gastritis, 85F gtube placed percutaneously  . PARTIAL HYSTERECTOMY    . PEG PLACEMENT N/A 07/19/2015   Dr.Rehman - 85F gtube, 70F jejunal feeding tube could only be advanced into the second part of the duodenum.  Marland Kitchen. PEG PLACEMENT N/A 04/04/2016   Procedure: PERCUTANEOUS ENDOSCOPIC GASTROSTOMY (PEG) REPLACEMENT;  Surgeon: Malissa HippoNajeeb U Rehman, MD;  [facility]: [facility];  Service: Endoscopy;  Laterality: N/A;  . PEG PLACEMENT N/A 04/05/2016   Procedure: PERCUTANEOUS ENDOSCOPIC GASTROSTOMY (PEG) REPLACEMENT;  Surgeon: Malissa HippoNajeeb U Rehman, MD;  [facility]: [facility];  Service: Endoscopy;  Laterality: N/A;  . PEG PLACEMENT  N/A 05/23/2017   Procedure: PERCUTANEOUS ENDOSCOPIC GASTROSTOMY (PEG) REPLACEMENT;  Surgeon: Malissa Hippo, MD;  [facility];  Service: Endoscopy;  Laterality: N/A;  . PEG PLACEMENT N/A 03/20/2018   Procedure: PERCUTANEOUS ENDOSCOPIC GASTROSTOMY (PEG) REPLACEMENT;  Surgeon:  Malissa Hippo, MD;  Location: [facility];  Service: Endoscopy;  Laterality: N/A;      Allergies Patient has no known allergies.  Family History  Problem Relation Age of Onset  . Angina Mother   . Hypothyroidism Mother   . Melanoma Mother   . Heart failure Father   . Melanoma Sister   . Prostate cancer Brother   . Colon cancer Neg Hx     Social History Social History   Tobacco Use  . Smoking status: Never Smoker  . Smokeless tobacco: Never Used  Substance Use Topics  . Alcohol use: No    Alcohol/week: 0.0 oz  . Drug use: No    Review of Systems  Constitutional: Positive fever/chills Eyes: No visual changes. ENT: No sore throat. Cardiovascular: Denies chest pain. Respiratory: Positive shortness of breath. Gastrointestinal: No abdominal pain.  No nausea, no vomiting.  No diarrhea.  No constipation. Genitourinary: Negative for dysuria. Musculoskeletal: Negative for back pain. Skin: Negative for rash. Neurological: Negative for headaches, focal weakness or numbness.  10-point ROS otherwise negative.  ____________________________________________   PHYSICAL EXAM:  VITAL SIGNS: Vitals:   05/01/18 0645 05/01/18 0845  BP: (!) 80/40   Pulse: 81   Resp: 15   Temp: (!) 97.2 F (36.2 C)   SpO2:  100%    Constitutional: Alert but appears to be in some distress. Increased WOB.  Eyes: Conjunctivae are normal.  Head: Atraumatic. Nose: No congestion/rhinnorhea. Mouth/Throat: Mucous membranes are moist.   Neck: No stridor.  Cardiovascular: Normal rate, regular rhythm. Good peripheral circulation. Grossly normal heart sounds.   Respiratory: Increased respiratory effort.  No retractions. Lungs with rales at the bases and course wheezing throughout.  Gastrointestinal: Soft and nontender. Positive distention. G-tube with some non-bloody fluid leaking from around the tube. Balloon appears well-seating with application of traction. Tightened the tube in place using  silicone stay and leaking stopped.  Musculoskeletal: No lower extremity tenderness nor edema. No gross deformities of extremities. Neurologic: No gross focal neurologic deficits are appreciated.  Skin:  Skin is warm, dry and intact. No rash noted.  ____________________________________________   LABS (all labs ordered are listed, but only abnormal results are displayed)  Labs Reviewed  COMPREHENSIVE METABOLIC PANEL - Abnormal; Notable for the following components:      Result Value   Sodium 130 (*)    Chloride 93 (*)    Glucose, Bld 281 (*)    BUN 30 (*)    Albumin 3.2 (*)    AST 58 (*)    GFR calc non Af Amer 53 (*)    All other components within normal limits  CBC WITH DIFFERENTIAL/PLATELET - Abnormal; Notable for the following components:   Platelets 139 (*)    All other components within normal limits  URINALYSIS, ROUTINE W REFLEX MICROSCOPIC - Abnormal; Notable for the following components:   APPearance HAZY (*)    Glucose, UA >=500 (*)    Protein, ur 30 (*)    All other components within normal limits  BRAIN NATRIURETIC PEPTIDE - Abnormal; Notable for the following components:   B Natriuretic Peptide 285.0 (*)    All other components within normal limits  LACTIC ACID, PLASMA - Abnormal; Notable for the  following components:   Lactic Acid, Venous 3.1 (*)    All other components within normal limits  CBC WITH DIFFERENTIAL/PLATELET - Abnormal; Notable for the following components:   RBC 3.80 (*)    Platelets 105 (*)    Lymphs Abs 0.5 (*)    All other components within normal limits  COMPREHENSIVE METABOLIC PANEL - Abnormal; Notable for the following components:   Sodium 133 (*)    Glucose, Bld 194 (*)    BUN 26 (*)    Calcium 8.6 (*)    Total Protein 5.6 (*)    Albumin 2.4 (*)    AST 52 (*)    GFR calc non Af Amer 55 (*)    All other components within normal limits  LACTIC ACID, PLASMA - Abnormal; Notable for the following components:   Lactic Acid, Venous 3.4 (*)     All other components within normal limits  LACTIC ACID, PLASMA - Abnormal; Notable for the following components:   Lactic Acid, Venous 2.5 (*)    All other components within normal limits  GLUCOSE, CAPILLARY - Abnormal; Notable for the following components:   Glucose-Capillary 186 (*)    All other components within normal limits  GLUCOSE, CAPILLARY - Abnormal; Notable for the following components:   Glucose-Capillary 196 (*)    All other components within normal limits  I-STAT CG4 LACTIC ACID, ED - Abnormal; Notable for the following components:   Lactic Acid, Venous 3.89 (*)    All other components within normal limits  I-STAT CG4 LACTIC ACID, ED - Abnormal; Notable for the following components:   Lactic Acid, Venous 5.09 (*)    All other components within normal limits  CULTURE, BLOOD (ROUTINE X 2)  CULTURE, BLOOD (ROUTINE X 2)  MRSA PCR SCREENING  URINE CULTURE  LIPASE, BLOOD  HEMOGLOBIN A1C  I-STAT TROPONIN, ED   ____________________________________________  EKG   EKG Interpretation  Date/Time:  Tuesday April 30 2018 20:13:32 EDT Ventricular Rate:  129 PR Interval:    QRS Duration: 80 QT Interval:  297 QTC Calculation: 435 R Axis:   -43 Text Interpretation:  Sinus tachycardia Left axis deviation Baseline wander in lead(s) V5 No STEMI.  Confirmed by Alona Bene [id number]) on 04/15/2018 9:03:41 PM       ____________________________________________  RADIOLOGY  Ct Angio Chest Pe W Or Wo Contrast  Result Date: 05/01/2018 CLINICAL DATA:  84 y/o  F; shortness of breath and hypoxia. EXAM: CT ANGIOGRAPHY CHEST WITH CONTRAST TECHNIQUE: Multidetector CT imaging of the chest was performed using the standard protocol during bolus administration of intravenous contrast. Multiplanar CT image reconstructions and MIPs were obtained to evaluate the vascular anatomy. CONTRAST:  ISOVUE-370 IOPAMIDOL (ISOVUE-370) INJECTION 76% COMPARISON:  05/10/2018 chest radiograph FINDINGS:  Cardiovascular: Satisfactory opacification of the pulmonary arteries to the segmental level. No evidence of pulmonary embolism. Normal heart size. No pericardial effusion. Calcific atherosclerosis of the aorta. Mediastinum/Nodes: Mild lower paratracheal, hilar, and AP window lymphadenopathy, likely reactive. Severe dilatation of the upper esophagus mild dilatation of the lower esophagus with fluid level. Normal thyroid gland. Lungs/Pleura: Small areas of consolidation within the bilateral upper lobes, left lower lobe lobar consolidation, and multi segmental consolidation of the right lower lobe with air bronchograms compatible with pneumonia. No pleural effusion or pneumothorax. Upper Abdomen: No acute abnormality. Musculoskeletal: No chest wall abnormality. No acute or significant osseous findings. Review of the MIP images confirms the above findings. IMPRESSION: 1. No pulmonary embolus identified. 2. Consolidation throughout the dependent  left-greater-than-right lungs compatible with pneumonia. 3. Aortic atherosclerosis. 4. Marked esophageal dilatation, question lower esophageal or gastroesophageal junction obstruction. Electronically Signed   By: Mitzi Hansen M.D.   On: 05/01/2018 00:33   Dg Chest Port 1 View  Result Date: 04/20/2018 CLINICAL DATA:  Shortness of breath. EXAM: PORTABLE CHEST 1 VIEW COMPARISON:  PA and lateral chest 07/13/2015. FINDINGS: There are small bilateral pleural effusions and basilar airspace disease, worse on the left. No pneumothorax. Heart size is normal. Aortic atherosclerosis is noted. Marked dilatation of the esophagus as seen on prior exam is unchanged. IMPRESSION: Left greater than right small effusions and basilar airspace disease, likely atelectasis. Marked dilatation of the esophagus is chronic and likely due to achalasia. Electronically Signed   By: Drusilla Kanner M.D.   On: 04/16/2018 21:16     ____________________________________________   PROCEDURES  Procedure(s) performed:   .Critical Care  Performed by: Maia Plan, MD  Authorized by: Maia Plan, MD   Critical care provider statement:    Critical care time (minutes):  60   Critical care time was exclusive of:  Separately billable procedures and treating other patients and teaching time   Critical care was necessary to treat or prevent imminent or life-threatening deterioration of the following conditions:  Respiratory failure, sepsis and circulatory failure   Critical care was time spent personally by me on the following activities:  Blood draw for specimens, development of treatment plan with patient or surrogate, evaluation of patient's response to treatment, examination of patient, obtaining history from patient or surrogate, ordering and performing treatments and interventions, ordering and review of laboratory studies, ordering and review of radiographic studies, pulse oximetry, re-evaluation of patient's condition and review of old charts   I assumed direction of critical care for this patient from another provider in my specialty: no       ____________________________________________   INITIAL IMPRESSION / ASSESSMENT AND PLAN / ED COURSE  Pertinent labs & imaging results that were available during my care of the patient were reviewed by me and considered in my medical decision making (see chart for details).  Patient arrives to the emergency department in acute respiratory distress.  She is hypoxemic on room air after already having several albuterol treatments in route.  Course on lung exam.  Unclear if this represents rales versus very coarse wheezing but exam is symmetric.  Patient warm to the touch here.  Initiating sepsis order set along with portable chest x-ray and will transition the patient to BiPAP. No vomiting.   08:15 PM Spoke with the patient's son who is listed as her emergency contact.   He states that he is the medical power of attorney.  He is unsure of his mother's CODE STATUS.  I mentioned that she was listed as a DNR in 2016 but he was not aware of this.  He states that his understanding is she would want to be a FULL CODE at this time.  Patient CXR reviewed. No acute findings but suspect PNA clinically. BP ok for now. Lactate uptrending but suspect albuterol may be increasing lactate. Continue IVF. Cultures pending. Patient looking much better on BiPAP.   Discussed patient's case with Hospitalist, Dr. Robb Matar to request admission. Patient and family (if present) updated with plan. Care transferred to Hospitalist service.  I reviewed all nursing notes, vitals, pertinent old records, EKGs, labs, imaging (as available).  ____________________________________________  FINAL CLINICAL IMPRESSION(S) / ED DIAGNOSES  Final diagnoses:  Acute respiratory failure with hypoxia (  HCC)  Sepsis, due to unspecified organism [facility])     MEDICATIONS GIVEN DURING THIS VISIT:  Medications  piperacillin-tazobactam (ZOSYN) IVPB 3.375 g (0 g Intravenous Stopped 05/01/18 0947)  vancomycin (VANCOCIN) IVPB 750 mg/150 ml premix (has no administration in time range)  lactated ringers infusion ( Intravenous Rate/Dose Change 05/01/18 0442)  ipratropium-albuterol (DUONEB) 0.5-2.5 (3) MG/3ML nebulizer solution 3 mL (3 mLs Nebulization Given 05/01/18 0845)  albuterol (PROVENTIL) (2.5 MG/3ML) 0.083% nebulizer solution 2.5 mg (has no administration in time range)  enoxaparin (LOVENOX) injection 40 mg (40 mg Subcutaneous Given 05/01/18 0935)  acetaminophen (TYLENOL) tablet 650 mg (has no administration in time range)    Or  acetaminophen (TYLENOL) suppository 650 mg (has no administration in time range)  ondansetron (ZOFRAN) tablet 4 mg (has no administration in time range)    Or  ondansetron (ZOFRAN) injection 4 mg (has no administration in time range)  chlorhexidine (PERIDEX) 0.12 % solution 15 mL (15 mLs  Mouth Rinse Given 05/01/18 0935)  MEDLINE mouth rinse (has no administration in time range)  ipratropium-albuterol (DUONEB) 0.5-2.5 (3) MG/3ML nebulizer solution 3 mL (3 mLs Nebulization Given 2018-05-10 2015)  acetaminophen (TYLENOL) suppository 650 mg (650 mg Rectal Given May 10, 2018 2024)  piperacillin-tazobactam (ZOSYN) IVPB 3.375 g (0 g Intravenous Stopped 05-10-18 2141)  vancomycin (VANCOCIN) IVPB 1000 mg/200 mL premix (0 mg Intravenous Stopped May 10, 2018 2314)  sodium chloride 0.9 % bolus 1,000 mL (0 mLs Intravenous Stopped 05-10-18 2246)  sodium chloride 0.9 % bolus 1,000 mL (0 mLs Intravenous Stopped 05/01/18 0000)  magnesium sulfate IVPB 2 g 50 mL (0 g Intravenous Stopped 05/01/18 0158)  ketorolac (TORADOL) 15 MG/ML injection 15 mg (15 mg Intravenous Given May 10, 2018 2315)  LORazepam (ATIVAN) injection 0.5 mg (0.5 mg Intravenous Given 05-10-2018 2318)  methylPREDNISolone sodium succinate (SOLU-MEDROL) 125 mg/2 mL injection 125 mg (125 mg Intravenous Given May 10, 2018 2316)  iopamidol (ISOVUE-370) 76 % injection 100 mL (100 mLs Intravenous Contrast Given 05/10/2018 2348)  lactated ringers bolus 500 mL (0 mLs Intravenous Stopped 05/01/18 0158)  lactated ringers bolus 250 mL (0 mLs Intravenous Stopped 05/01/18 0442)  lactated ringers bolus 250 mL (0 mLs Intravenous Stopped 05/01/18 0545)  lactated ringers bolus 500 mL (500 mLs Intravenous Bolus from Bag 05/01/18 0551)  sodium chloride 0.9 % bolus 1,000 mL (0 mLs Intravenous Stopped 05/01/18 1045)    Note:  This document was prepared using Dragon voice recognition software and may include unintentional dictation errors.  Alona Bene, MD Emergency Medicine   Nalee Lightle, Arlyss Repress, MD 05/01/18 [phone]

## 2018-04-30 NOTE — ED Triage Notes (Signed)
Pt arrives from home via [facility]. Pt C/O SOB. Pt given 4 albuterol and 1 atrovent breathing tx in route. Pt RA sats 86%. Pt reports a fall this morning but did not call EMS.

## 2018-04-30 NOTE — Progress Notes (Signed)
Pharmacy Antibiotic Note  Nancy RakeDorothy P Horn is a 84 y.o. female admitted on 04/24/2018 with sepsis.  Pharmacy has been consulted for Vancomycin and Zosyn dosing.  Plan: Vancomycin 1000 mg IV x 1 dose Vancomycin 750 mg IV every 24 hours.  Goal trough 15-20 mcg/mL. Zosyn 3.375g IV q8h (4 hour infusion).  Monitor labs, c/s, and vanco trough as indicated  Height: 5\' 5"  (165.1 cm) Weight: 120 lb (54.4 kg) IBW/kg (Calculated) : 57  Temp (24hrs), Avg:103.2 F (39.6 C), Min:103.2 F (39.6 C), Max:103.2 F (39.6 C)  Recent Labs  Lab 05/10/2018 2009  WBC 8.1  CREATININE 0.93    Estimated Creatinine Clearance: 34.5 mL/min (by C-G formula based on SCr of 0.93 mg/dL).    No Known Allergies  Antimicrobials this admission: Zosyn 6/18 >>  Vanco 6/18 >>   Dose adjustments this admission: N/A  Microbiology results: 6/18 BCx:  6/18 UCx:     Thank you for allowing pharmacy to be a part of this patient's care.  Nancy MooreSteven C Galan Horn 04/20/2018 9:28 PM

## 2018-04-30 NOTE — H&P (Signed)
History and Physical    Nancy RakeDorothy P Mcsorley WUJ:[id number] DOB: [birth year] DOA: 05/05/2018  PCP: Kari BaarsHawkins, Edward, MD   Patient coming from: Home.  I have personally briefly reviewed patient's old medical records in [facility]  Chief Complaint: Shortness of breath.  HPI: Nancy Horn is a 84 y.o. female with medical history significant of Candida esophagitis, dysphagia, history of H. pylori gastritis, peripheral neuropathy, osteoporosis, vertigo who is coming to the emergency department due to fever, progressively worse shortness of breath, fatigue and malaise for the past 2 days.  EMS reported initial O2 sat was 86% on room air.  She was given several albuterol and 1 Atrovent treatments via nebulizer by EMS in route to the ED.  When seen in the ED, the patient was anxious, tachycardic, febrile and having discomfort on her nose from the BiPAP mask.  She was unable to provide further history.    ED Course: On arrival to the emergency department her temperature was 103.2, pulse 130, respirations 30, blood pressure 128/66 mmHg and O2 sat 94%.  She was given supplemental oxygen, placed on BiPAP ventilation, a DuoNeb treatment, vancomycin and Zosyn IVPB.  Lab work shows an urinalysis with glucosuria of more than 500 and proteinuria 30 mg/dL.  White count of 8.1 with 87% neutrophils, 8% lymphocytes and 5% monocytes.  Hemoglobin 13.7 g/dL and platelets [id number].  Lipase was 25, alkaline phosphatase 113, ALT 32, and AST 58 units/L.  Initial lactic acid was 3.89, second measurement 5.09 and third sample was 3.1 mmol/L.  Values may be higher than expected due to the use of albuterol.Sodium 130, potassium 3.9, CO2 27 glucose 281 mg/dL.  BUN 30, creatinine 0.93, calcium 9.3 and glucose 281 mg/dL.  Total protein was 7.2 and albumin 3.2 g/dL.    Imaging: her chest radiograph showed normal heart size, bilateral small pleural effusions, bilateral lower airspace disease, likely due to atelectasis,  esophageal achalasia.  CT chest was ordered and show similar findings as above, but lower airspace disease was read as bilateral pneumonia.  Please see images and full radiology report for further detail.  Review of Systems: Unable to obtain due to BiPAP mask and dyspnea.  Past Medical History:  Diagnosis Date  . Candida esophagitis (HCC) APR 2016  . Dysphagia   . Helicobacter pylori gastritis APR 2016   treated with amox and biaxin  . Neuropathy   . Osteoporosis   . Vertigo     Past Surgical History:  Procedure Laterality Date  . ESOPHAGEAL DILATION N/A 02/19/2015   Procedure: ESOPHAGEAL DILATION;  Surgeon: West BaliSandi L Fields, MD;  [facility];  Service: Endoscopy;  Laterality: N/A;  . ESOPHAGOGASTRODUODENOSCOPY N/A 02/19/2015   Dr.Fields- dilated proximal esophagus, possible mid- esophageal web, mild non-erosive gastritis bx= chronic gastritis with focal activity and hpylori  . ESOPHAGOGASTRODUODENOSCOPY N/A 04/05/2016   Procedure: ESOPHAGOGASTRODUODENOSCOPY (EGD);  Surgeon: Malissa HippoNajeeb U Rehman, MD;  [facility];  Service: Endoscopy;  Laterality: N/A;  325  . ESOPHAGOGASTRODUODENOSCOPY N/A 05/23/2017   Procedure: ESOPHAGOGASTRODUODENOSCOPY (EGD);  Surgeon: [name], Najeeb U, MD;  [facility];  Service: Endoscopy;  Laterality: N/A;  125 Peg tube please make sure that it has a cut off  . ESOPHAGOGASTRODUODENOSCOPY (EGD) WITH PROPOFOL N/A 07/19/2015   Dr.Rehman- dilated esophageal body with pooling of secretions, antral gastritis, 26F gtube placed percutaneously  . PARTIAL HYSTERECTOMY    . PEG PLACEMENT N/A 07/19/2015   Dr.Rehman - 26F gtube,  21F jejunal feeding tube could only be advanced into the second part of the duodenum.  Marland Kitchen PEG PLACEMENT N/A 04/04/2016   Procedure: PERCUTANEOUS ENDOSCOPIC GASTROSTOMY (PEG) REPLACEMENT;  Surgeon: Malissa Hippo, MD;  [facility];  Service: Endoscopy;  Laterality: N/A;  . PEG PLACEMENT N/A 04/05/2016   Procedure:  PERCUTANEOUS ENDOSCOPIC GASTROSTOMY (PEG) REPLACEMENT;  Surgeon: Malissa Hippo, MD;  [facility];  Service: Endoscopy;  Laterality: N/A;  . PEG PLACEMENT N/A 05/23/2017   Procedure: PERCUTANEOUS ENDOSCOPIC GASTROSTOMY (PEG) REPLACEMENT;  Surgeon: Malissa Hippo, MD;  [facility];  Service: Endoscopy;  Laterality: N/A;  . PEG PLACEMENT N/A 03/20/2018   Procedure: PERCUTANEOUS ENDOSCOPIC GASTROSTOMY (PEG) REPLACEMENT;  Surgeon: Malissa Hippo, MD;  [facility];  Service: Endoscopy;  Laterality: N/A;     reports that she has never smoked. She has never used smokeless tobacco. She reports that she does not drink alcohol or use drugs.  No Known Allergies  Family History  Problem Relation Age of Onset  . Angina Mother   . Hypothyroidism Mother   . Melanoma Mother   . Heart failure Father   . Melanoma Sister   . Prostate cancer Brother   . Colon cancer Neg Hx     Prior to Admission medications   Medication Sig Start Date End Date Taking? Authorizing Provider  ALPRAZolam Prudy Feeler) 0.5 MG tablet Take 1 tablet (0.5 mg total) by mouth 3 (three) times daily as needed for anxiety. 07/21/15   Kari Baars, MD  calcium carbonate, dosed in mg elemental calcium, 1250 (500 CA) MG/5ML Place 5 mLs (500 mg of elemental calcium total) into feeding tube 2 (two) times daily with a meal. 07/21/15   Kari Baars, MD  gabapentin (NEURONTIN) 100 MG capsule Take 600 mg by mouth at bedtime.  01/01/15   [provider]  magnesium citrate SOLN 1 cc twice daily 09/22/15   Tiffany Kocher, PA-C  meclizine (ANTIVERT) 25 MG tablet Take 25 mg by mouth at bedtime.    [provider]  Nutritional Supplements (FEEDING SUPPLEMENT, JEVITY 1.2 CAL,) LIQD Place 1,000 mLs into feeding tube daily. 07/21/15   Kari Baars, MD  nystatin cream (MYCOSTATIN) APPLY TO AFFECTED AREAS TWICE DAILY. USE WITH TRIAMCINOLONE CREAM. 02/13/18   Setzer, Brand Males, NP  Potassium Chloride 40  MEQ/15ML (20%) SOLN Take 15 mLs by mouth 2 (two) times daily. 09/13/15   [provider]  ranitidine (ZANTAC) 15 MG/ML syrup Take 150 mg by mouth 2 (two) times daily.     [provider]  silver nitrate applicators 75-25 % applicator Apply topically daily. Apply silver nitrate solution to granulation tissue 3 times a week as directed until it is completely resolved. 04/04/16   Rehman, Joline Maxcy, MD  triamcinolone cream (KENALOG) 0.1 % APPLY TO AFFECTED AREAS TWICE DAILY. USE WITH NYSTATIN CREAM. 03/18/18   Malissa Hippo, MD    Physical Exam: Vitals:   05/14/18 2200 14-May-2018 2215 05/14/18 2251 05/14/2018 2312  BP:   (!) 100/48   Pulse: (!) 114 (!) 112    Resp: (!) 31 (!) 35    Temp:    (!) 101.1 F (38.4 C)  TempSrc:    Rectal  SpO2: 100% 100%    Weight:      Height:        Constitutional: Febrile, tachycardic initially.  NAD and sleeping comfortably in ICU/SDU. Eyes: PERRL, lids and conjunctivae normal ENMT: BiPAP mask on.  Mucous membranes are dry. Posterior pharynx clear of any exudate or lesions. Neck: normal, supple, no masses, no thyromegaly Respiratory: Tachypneic in the high 20s and low 30s, no accessory muscle use.  Decreased breath sounds on bases with bibasilar rales.  Mild bilateral rhonchi.  Subtle wheezing. Cardiovascular: Regular rate and rhythm, no murmurs / rubs / gallops. No lower extremities pitting edema. 2+ pedal pulses. No carotid bruits.  Abdomen: Positive G-tube with poor local care.  Soft, no tenderness, no masses palpated. No hepatosplenomegaly. Bowel sounds positive.  Musculoskeletal: no clubbing / cyanosis.  There is mild muscle wasting of lower extremities.  Good ROM, no contractures. Normal muscle tone.  Skin: Small puncture wound on left fifth toe on healing small wound on the dorsal aspect of her right foot over second MTP area.  There is onychomycosis of toenails.  For further detail, please see pictures below. Neurologic: CN 2-12 grossly  intact. Sensation intact, DTR normal. Strength 5/5 in all 4.  Psychiatric: Normal judgment and insight. Alert and oriented x 3. Normal mood.           Labs on Admission: I have personally reviewed following labs and imaging studies  CBC: Recent Labs  Lab May 25, 2018 2009  WBC 8.1  NEUTROABS 7.1  HGB 13.7  HCT 42.2  MCV 99.3  PLT 139*   Basic Metabolic Panel: Recent Labs  Lab 05/25/18 2009  NA 130*  K 3.9  CL 93*  CO2 27  GLUCOSE 281*  BUN 30*  CREATININE 0.93  CALCIUM 9.3   GFR: Estimated Creatinine Clearance: 34.5 mL/min (by C-G formula based on SCr of 0.93 mg/dL). Liver Function Tests: Recent Labs  Lab 05-25-18 2009  AST 58*  ALT 32  ALKPHOS 113  BILITOT 0.6  PROT 7.2  ALBUMIN 3.2*   Recent Labs  Lab 05-25-2018 2009  LIPASE 25   No results for input(s): AMMONIA in the last 168 hours. Coagulation Profile: No results for input(s): INR, PROTIME in the last 168 hours. Cardiac Enzymes: No results for input(s): CKTOTAL, CKMB, CKMBINDEX, TROPONINI in the last 168 hours. BNP (last 3 results) No results for input(s): PROBNP in the last 8760 hours. HbA1C: No results for input(s): HGBA1C in the last 72 hours. CBG: No results for input(s): GLUCAP in the last 168 hours. Lipid Profile: No results for input(s): CHOL, HDL, LDLCALC, TRIG, CHOLHDL, LDLDIRECT in the last 72 hours. Thyroid Function Tests: No results for input(s): TSH, T4TOTAL, FREET4, T3FREE, THYROIDAB in the last 72 hours. Anemia Panel: No results for input(s): VITAMINB12, FOLATE, FERRITIN, TIBC, IRON, RETICCTPCT in the last 72 hours. Urine analysis:    Component Value Date/Time   COLORURINE YELLOW 25-May-2018 2005   APPEARANCEUR HAZY (A) 05/25/2018 2005   LABSPEC 1.018 05/25/2018 2005   PHURINE 7.0 05/25/18 2005   GLUCOSEU >=500 (A) May 25, 2018 2005   HGBUR NEGATIVE 05/25/18 2005   BILIRUBINUR NEGATIVE 05/25/18 2005   KETONESUR NEGATIVE 05/25/18 2005   PROTEINUR 30 (A) 2018-05-25  2005   UROBILINOGEN 0.2 07/13/2015 1850   NITRITE NEGATIVE 25-May-2018 2005   LEUKOCYTESUR NEGATIVE 05/25/18 2005    Radiological Exams on Admission: Dg Chest Port 1 View  Result Date: 05/25/18 CLINICAL DATA:  Shortness of breath. EXAM: PORTABLE CHEST 1 VIEW COMPARISON:  PA and lateral chest 07/13/2015. FINDINGS: There are small bilateral pleural effusions and basilar airspace disease, worse on the left. No pneumothorax. Heart size is normal. Aortic atherosclerosis is noted. Marked dilatation of the esophagus as seen on prior exam is unchanged.  IMPRESSION: Left greater than right small effusions and basilar airspace disease, likely atelectasis. Marked dilatation of the esophagus is chronic and likely due to achalasia. Electronically Signed   By: Drusilla Kanner M.D.   On: 2018-05-03 21:16     EKG: Independently reviewed. Vent. rate 129 BPM PR interval * ms QRS duration 80 ms QT/QTc 297/435 ms P-R-T axes 86 -43 53 Sinus tachycardia Left axis deviation Baseline wander in lead(s) V5  Assessment/Plan Principal Problem:   Sepsis due to undetermined organism (HCC) Likely due to pneumonia. Chest x-ray showed bibasilar airspace disease and small bilateral pleural effusions. Continue IV fluids. Continue supplemental oxygen and BiPAP ventilation. Continue vancomycin per pharmacy. Continue Zosyn per pharmacy. Follow-up blood cultures and sensitivity. Check CTA chest.  Active Problems:   Acute respiratory failure (HCC) Continue supplemental oxygen. Continue BiPAP ventilation. Scheduled and as needed bronchodilators.    Arterial hypotension Initially the patient was normotensive, tachycardic and febrile.  Currently sleeping comfortably. Since her tachycardia and fever have resolved, her SBP has decreased ranging from the 70s to the 90s., particularly while sleeping.  Likely, the patient baseline BP might be lower than normal. She does not seem to be in distress and has had 900 mL of  urine output so far.  Echocardiogram, ordered due to abnormal EKG, should give [place] a clearer idea of her cardiovascular status.  Continue IV fluids for now.  Will consider pressors if he does not respond to fluids or the patient's clinical condition worsens.      Lactic acidosis Likely secondary to sepsis and albuterol. Continue BiPAP ventilation. Continue IV fluids.    Hyperglycemia Treated initially with fluids, but received solumedrol earlier. CBG monitoring every 6 hours while NPO. SQ regular insulin as needed. Check hemoglobin A1C.    Hyponatremia This is mostly pseudo-hyponatremia. Corrected sodium was 134 mmol/L. Continue IV fluids Follow sodium level. Follow-up glucose.    Dysphagia, pharyngoesophageal phase This is a chronic issue. She has a G-Tube, but has trouble handling oral secretions Last month Dr. Karilyn Cota mentioned radiation salivary glands, since she is not a candidate for anticholinergic therapy. However, given the pneumonia picture. I will have SLP evaluate.    Malnutrition of moderate degree (HCC) Consult nutritional services. Consult GI for evaluation of G-tube.    Elevated AST (SGOT) Follow level in the morning.    DVT prophylaxis: Lovenox SQ. Code Status: Full code. Family Communication:  Disposition Plan: Admit for IV antibiotic therapy and respiratory support with BiPAP mode ventilation. Consults called:  Admission status: Inpatient/SDU.   Bobette Mo MD [facility] Pager [phone].  If 7PM-7AM, please contact night-coverage [email or web address] Password [facility]  May 03, 2018, 11:51 PM   This document was prepared using Dragon voice recognition software and may contain some unintended transcription errors.  Addendum:  Radiological Exams on Admission: Ct Angio Chest Pe W Or Wo Contrast  Result Date: 05/01/2018 CLINICAL DATA:  84 y/o  F; shortness of breath and hypoxia. EXAM: CT ANGIOGRAPHY CHEST WITH CONTRAST TECHNIQUE:  Multidetector CT imaging of the chest was performed using the standard protocol during bolus administration of intravenous contrast. Multiplanar CT image reconstructions and MIPs were obtained to evaluate the vascular anatomy. CONTRAST:  ISOVUE-370 IOPAMIDOL (ISOVUE-370) INJECTION 76% COMPARISON:  05/03/18 chest radiograph FINDINGS: Cardiovascular: Satisfactory opacification of the pulmonary arteries to the segmental level. No evidence of pulmonary embolism. Normal heart size. No pericardial effusion. Calcific atherosclerosis of the aorta. Mediastinum/Nodes: Mild lower paratracheal, hilar, and AP window lymphadenopathy, likely reactive. Severe dilatation of the  upper esophagus mild dilatation of the lower esophagus with fluid level. Normal thyroid gland. Lungs/Pleura: Small areas of consolidation within the bilateral upper lobes, left lower lobe lobar consolidation, and multi segmental consolidation of the right lower lobe with air bronchograms compatible with pneumonia. No pleural effusion or pneumothorax. Upper Abdomen: No acute abnormality. Musculoskeletal: No chest wall abnormality. No acute or significant osseous findings. Review of the MIP images confirms the above findings. IMPRESSION: 1. No pulmonary embolus identified. 2. Consolidation throughout the dependent left-greater-than-right lungs compatible with pneumonia. 3. Aortic atherosclerosis. 4. Marked esophageal dilatation, question lower esophageal or gastroesophageal junction obstruction. Electronically Signed   By: Mitzi Hansen M.D.   On: 05/01/2018 00:33    Sanda Klein, MD

## 2018-04-30 NOTE — ED Notes (Signed)
Critical result. Lactic acid. 3.89. Dr. Jacqulyn BathLong notified.

## 2018-05-01 ENCOUNTER — Encounter (HOSPITAL_COMMUNITY): Payer: Self-pay

## 2018-05-01 ENCOUNTER — Inpatient Hospital Stay (HOSPITAL_COMMUNITY): Payer: Medicare Other

## 2018-05-01 DIAGNOSIS — K9423 Gastrostomy malfunction: Secondary | ICD-10-CM

## 2018-05-01 DIAGNOSIS — I351 Nonrheumatic aortic (valve) insufficiency: Secondary | ICD-10-CM

## 2018-05-01 DIAGNOSIS — R000 Tachycardia, unspecified: Secondary | ICD-10-CM | POA: Diagnosis present

## 2018-05-01 DIAGNOSIS — Z515 Encounter for palliative care: Secondary | ICD-10-CM

## 2018-05-01 DIAGNOSIS — J690 Pneumonitis due to inhalation of food and vomit: Secondary | ICD-10-CM

## 2018-05-01 DIAGNOSIS — I361 Nonrheumatic tricuspid (valve) insufficiency: Secondary | ICD-10-CM

## 2018-05-01 DIAGNOSIS — I959 Hypotension, unspecified: Secondary | ICD-10-CM | POA: Diagnosis present

## 2018-05-01 DIAGNOSIS — R739 Hyperglycemia, unspecified: Secondary | ICD-10-CM | POA: Diagnosis present

## 2018-05-01 DIAGNOSIS — I340 Nonrheumatic mitral (valve) insufficiency: Secondary | ICD-10-CM

## 2018-05-01 DIAGNOSIS — Z7189 Other specified counseling: Secondary | ICD-10-CM

## 2018-05-01 DIAGNOSIS — A419 Sepsis, unspecified organism: Secondary | ICD-10-CM

## 2018-05-01 DIAGNOSIS — E440 Moderate protein-calorie malnutrition: Secondary | ICD-10-CM

## 2018-05-01 DIAGNOSIS — E871 Hypo-osmolality and hyponatremia: Secondary | ICD-10-CM

## 2018-05-01 LAB — CBC WITH DIFFERENTIAL/PLATELET
Basophils Absolute: 0 10*3/uL (ref 0.0–0.1)
Basophils Relative: 0 %
EOS PCT: 0 %
Eosinophils Absolute: 0 10*3/uL (ref 0.0–0.7)
HEMATOCRIT: 37.9 % (ref 36.0–46.0)
HEMOGLOBIN: 12.3 g/dL (ref 12.0–15.0)
LYMPHS PCT: 8 %
Lymphs Abs: 0.5 10*3/uL — ABNORMAL LOW (ref 0.7–4.0)
MCH: 32.4 pg (ref 26.0–34.0)
MCHC: 32.5 g/dL (ref 30.0–36.0)
MCV: 99.7 fL (ref 78.0–100.0)
MONOS PCT: 4 %
Monocytes Absolute: 0.2 10*3/uL (ref 0.1–1.0)
NEUTROS PCT: 88 %
Neutro Abs: 5 10*3/uL (ref 1.7–7.7)
PLATELETS: 105 10*3/uL — AB (ref 150–400)
RBC: 3.8 MIL/uL — AB (ref 3.87–5.11)
RDW: 12.9 % (ref 11.5–15.5)
WBC: 5.7 10*3/uL (ref 4.0–10.5)

## 2018-05-01 LAB — COMPREHENSIVE METABOLIC PANEL
ALT: 27 U/L (ref 14–54)
AST: 52 U/L — ABNORMAL HIGH (ref 15–41)
Albumin: 2.4 g/dL — ABNORMAL LOW (ref 3.5–5.0)
Alkaline Phosphatase: 89 U/L (ref 38–126)
Anion gap: 7 (ref 5–15)
BILIRUBIN TOTAL: 0.7 mg/dL (ref 0.3–1.2)
BUN: 26 mg/dL — ABNORMAL HIGH (ref 6–20)
CHLORIDE: 101 mmol/L (ref 101–111)
CO2: 25 mmol/L (ref 22–32)
Calcium: 8.6 mg/dL — ABNORMAL LOW (ref 8.9–10.3)
Creatinine, Ser: 0.89 mg/dL (ref 0.44–1.00)
GFR calc Af Amer: >60 mL/min (ref >60)
GFR, EST NON AFRICAN AMERICAN: 55 mL/min — AB (ref >60)
Glucose, Bld: 194 mg/dL — ABNORMAL HIGH (ref 65–99)
Potassium: 4.1 mmol/L (ref 3.5–5.1)
Sodium: 133 mmol/L — ABNORMAL LOW (ref 135–145)
Total Protein: 5.6 g/dL — ABNORMAL LOW (ref 6.5–8.1)

## 2018-05-01 LAB — GLUCOSE, CAPILLARY
GLUCOSE-CAPILLARY: 186 mg/dL — AB (ref 65–99)
GLUCOSE-CAPILLARY: 141 mg/dL — AB (ref 65–99)
Glucose-Capillary: 136 mg/dL — ABNORMAL HIGH (ref 65–99)
Glucose-Capillary: 196 mg/dL — ABNORMAL HIGH (ref 65–99)
Glucose-Capillary: 93 mg/dL (ref 65–99)

## 2018-05-01 LAB — LACTIC ACID, PLASMA
LACTIC ACID, VENOUS: 3.4 mmol/L — AB (ref 0.5–1.9)
Lactic Acid, Venous: 3.1 mmol/L (ref 0.5–1.9)
Lactic Acid, Venous: 2.5 mmol/L (ref 0.5–1.9)

## 2018-05-01 LAB — HEMOGLOBIN A1C
Hgb A1c MFr Bld: 6.1 % — ABNORMAL HIGH (ref 4.8–5.6)
Mean Plasma Glucose: 128.37 mg/dL

## 2018-05-01 LAB — MRSA PCR SCREENING: MRSA by PCR: NEGATIVE

## 2018-05-01 LAB — ECHOCARDIOGRAM COMPLETE
Height: 65 in
Weight: 2021.18 oz

## 2018-05-01 MED ORDER — LACTATED RINGERS IV SOLN
INTRAVENOUS | Status: DC
  Administered 2018-05-01: 02:00:00 via INTRAVENOUS

## 2018-05-01 MED ORDER — AMIODARONE HCL IN DEXTROSE 360-4.14 MG/200ML-% IV SOLN
30.0000 mg/h | INTRAVENOUS | Status: DC
  Administered 2018-05-01: 30 mg/h via INTRAVENOUS
  Filled 2018-05-01: qty 200

## 2018-05-01 MED ORDER — CHLORHEXIDINE GLUCONATE CLOTH 2 % EX PADS
6.0000 | MEDICATED_PAD | Freq: Every day | CUTANEOUS | Status: DC
  Administered 2018-05-01 – 2018-05-04 (×4): 6 via TOPICAL

## 2018-05-01 MED ORDER — ACETAMINOPHEN 325 MG PO TABS
650.0000 mg | ORAL_TABLET | Freq: Four times a day (QID) | ORAL | Status: DC | PRN
  Administered 2018-05-04 – 2018-05-05 (×4): 650 mg via ORAL
  Filled 2018-05-01 (×4): qty 2

## 2018-05-01 MED ORDER — MECLIZINE HCL 12.5 MG PO TABS
12.5000 mg | ORAL_TABLET | Freq: Once | ORAL | Status: AC
  Administered 2018-05-01: 12.5 mg via ORAL
  Filled 2018-05-01: qty 1

## 2018-05-01 MED ORDER — SODIUM CHLORIDE 0.9 % IV BOLUS
1000.0000 mL | Freq: Once | INTRAVENOUS | Status: AC
Start: 2018-05-01 — End: 2018-05-01
  Administered 2018-05-01: 1000 mL via INTRAVENOUS

## 2018-05-01 MED ORDER — ENOXAPARIN SODIUM 40 MG/0.4ML ~~LOC~~ SOLN
40.0000 mg | SUBCUTANEOUS | Status: DC
  Administered 2018-05-01 – 2018-05-05 (×5): 40 mg via SUBCUTANEOUS
  Filled 2018-05-01 (×5): qty 0.4

## 2018-05-01 MED ORDER — ALPRAZOLAM 0.5 MG PO TABS
0.5000 mg | ORAL_TABLET | Freq: Once | ORAL | Status: AC
  Administered 2018-05-01: 0.5 mg via ORAL
  Filled 2018-05-01: qty 1

## 2018-05-01 MED ORDER — LACTATED RINGERS IV BOLUS
500.0000 mL | Freq: Once | INTRAVENOUS | Status: AC
  Administered 2018-05-01: 500 mL via INTRAVENOUS

## 2018-05-01 MED ORDER — ACETAMINOPHEN 650 MG RE SUPP: 650.0000 mg | Freq: Four times a day (QID) | RECTAL | Status: DC | PRN

## 2018-05-01 MED ORDER — LACTATED RINGERS IV BOLUS
250.0000 mL | Freq: Once | INTRAVENOUS | Status: AC
  Administered 2018-05-01: 250 mL via INTRAVENOUS

## 2018-05-01 MED ORDER — ONDANSETRON HCL 4 MG PO TABS: 4.0000 mg | ORAL_TABLET | Freq: Four times a day (QID) | ORAL | Status: DC | PRN

## 2018-05-01 MED ORDER — SODIUM CHLORIDE 0.9 % IV SOLN
INTRAVENOUS | Status: DC
  Administered 2018-05-01 – 2018-05-04 (×6): via INTRAVENOUS

## 2018-05-01 MED ORDER — GABAPENTIN 250 MG/5ML PO SOLN
300.0000 mg | Freq: Once | ORAL | Status: DC
  Filled 2018-05-01: qty 6

## 2018-05-01 MED ORDER — AMIODARONE HCL IN DEXTROSE 360-4.14 MG/200ML-% IV SOLN
60.0000 mg/h | INTRAVENOUS | Status: AC
  Administered 2018-05-01 (×2): 60 mg/h via INTRAVENOUS
  Filled 2018-05-01 (×2): qty 200

## 2018-05-01 MED ORDER — SODIUM CHLORIDE 0.9% FLUSH: 10.0000 mL | INTRAVENOUS | Status: DC | PRN

## 2018-05-01 MED ORDER — ALBUTEROL SULFATE (2.5 MG/3ML) 0.083% IN NEBU: 2.5000 mg | INHALATION_SOLUTION | RESPIRATORY_TRACT | Status: DC | PRN

## 2018-05-01 MED ORDER — ONDANSETRON HCL 4 MG/2ML IJ SOLN: 4.0000 mg | Freq: Four times a day (QID) | INTRAMUSCULAR | Status: DC | PRN

## 2018-05-01 MED ORDER — AMIODARONE LOAD VIA INFUSION
150.0000 mg | Freq: Once | INTRAVENOUS | Status: AC
  Administered 2018-05-01: 150 mg via INTRAVENOUS
  Filled 2018-05-01: qty 83.34

## 2018-05-01 MED ORDER — SODIUM CHLORIDE 0.9% FLUSH
10.0000 mL | Freq: Two times a day (BID) | INTRAVENOUS | Status: DC
  Administered 2018-05-01 – 2018-05-06 (×9): 10 mL

## 2018-05-01 MED ORDER — IPRATROPIUM-ALBUTEROL 0.5-2.5 (3) MG/3ML IN SOLN
3.0000 mL | Freq: Four times a day (QID) | RESPIRATORY_TRACT | Status: DC
  Administered 2018-05-01 – 2018-05-06 (×20): 3 mL via RESPIRATORY_TRACT
  Filled 2018-05-01 (×20): qty 3

## 2018-05-01 MED ORDER — ORAL CARE MOUTH RINSE
15.0000 mL | Freq: Two times a day (BID) | OROMUCOSAL | Status: DC
  Administered 2018-05-01 – 2018-05-05 (×9): 15 mL via OROMUCOSAL

## 2018-05-01 MED ORDER — CHLORHEXIDINE GLUCONATE 0.12 % MT SOLN
15.0000 mL | Freq: Two times a day (BID) | OROMUCOSAL | Status: DC
  Administered 2018-05-01 – 2018-05-05 (×10): 15 mL via OROMUCOSAL
  Filled 2018-05-01 (×10): qty 15

## 2018-05-01 NOTE — Progress Notes (Addendum)
After 500mL bolus LR given pts BP 78/50 manually. 2 RNs verified. Dr Robb Matarrtiz made aware.  No new orders at this time- waiting at Lactic acid

## 2018-05-01 NOTE — Progress Notes (Signed)
Inpatient Diabetes Program Recommendations  AACE/ADA: New Consensus Statement on Inpatient Glycemic Control (2019)  Target Ranges:  Prepandial:   less than 140 mg/dL      Peak postprandial:   less than 180 mg/dL (1-2 hours)      Critically ill patients:  140 - 180 mg/dL   Results for Nancy Horn, Nancy Horn (MRN [id number]) as of 05/01/2018 09:31  Ref. Range 05/01/2018 03:56 05/01/2018 05:58  Glucose-Capillary Latest Ref Range: 65 - 99 mg/dL 086186 (H) 578196 (H)  Results for Nancy Horn, Nancy Horn (MRN [id number]) as of 05/01/2018 09:31  Ref. Range 05/10/2018 20:09 05/01/2018 05:41  Glucose Latest Ref Range: 65 - 99 mg/dL 413281 (H) 244194 (H)   Review of Glycemic Control  Diabetes history: No Outpatient Diabetes medications: NA Current orders for Inpatient glycemic control: CBG monitoring  Inpatient Diabetes Program Recommendations: Correction (SSI): While inpatient, may want to consider ordering CBGs with Novolog 0-9 units Q4H. HgbA1C: A1C in process.   Thanks, Orlando PennerMarie Salvatrice Morandi, RN, MSN, CDE Diabetes Coordinator Inpatient Diabetes Program [phone] (Team Pager from 8am to 5pm)

## 2018-05-01 NOTE — Progress Notes (Signed)
Dr. Robb Matarrtiz aware of BP 69/43 and recheck of 71/37-MD in room with pt. New order for 250mL LR bolus. Will continue to monitor pt

## 2018-05-01 NOTE — Progress Notes (Signed)
Dr. Robb Matarrtiz paged and made aware of pts Bp 70/40 manually checked by 2RNs. Dr. Is on the floor. Pt had >700 on the bladder scan right after tx from ED. New order for temp foley from Donnamarie PoagK. Kirby, NP.

## 2018-05-01 NOTE — Consult Note (Signed)
Consultation Note Date: 05/01/2018   Patient Name: Nancy Horn  DOB: 02-22-1925  MRN: [id number]  Age / Sex: 84 y.o., female  PCP: Kari Baars, MD Referring Physician: Kari Baars, MD  Reason for Consultation: Establishing goals of care  HPI/Patient Profile: 84 y.o. female  with past medical history of esophageal dilation April 2016 with Candida esophagitis, H. pylori, chronic gastritis, PEG tube placement September 2016, with approximately yearly replacement, neuropathy, osteoporosis, anxiety, admitted on 05/29/2018 with sepsis due to undetermined organism, likely pneumonia, acute respiratory failure.   Clinical Assessment and Goals of Care: [name] is resting quietly in bed.  She has BiPAP in place, but nursing staff is removing this so she can speak with her son.  She is alert and oriented x3, calm and cooperative, pleasant.  We talked about her acute health concerns.  I share that we are giving her IV fluids and antibiotics, also BiPAP.  She states that she does not like the BiPAP, but is willing to wear it if needed for the next few days.  I shared that she always decides what treatments she does and does not want.  [name] asks about hospice services.  She states that she had heard someone mentioned hospice.  I share that I believe hospice is a great service, but this point, we are continuing treatment with a goal of improvement.  She agrees.  We talked about in-home hospice services in the future, she states her sister had hospice in the home.  [name] states that her sister is driving up from [place], her grandchildren from the [place] and [place].  Conversation with son Dorene Sorrow at bedside.  We talked about [name]'s health history.  Dorene Sorrow states that he is unsure about his mother's functional status.  Although he lives 2 blocks away, [name] is very independent.  He shares  that a friend, Dillard Essex, owns a beauty shop next door, has a key to [name]'s home, and checks on her daily.  Dorene Sorrow states that he is unsure who gets supplies such as laundry detergent and toilet paper for his mother.  He states he is also unsure about her functional status with bathing and dressing (he assumes that she is able to do this on her own), but she does use a 3 wheeled walker.  We talked about healthcare power of attorney, see below. We talked about CODE STATUS, see below.  Healthcare POA NEXT OF KIN -Mrs. Kisner states that she likes to make her own choices.  I share that she will be making her own choices unless she can no longer speak.  Dorene Sorrow interjects, stating that he would make choices if she could not.  I continue to look at [name], and ask her again who would speak for her if she could not.  She names her son, [name] as her surrogate decision-maker.   SUMMARY OF RECOMMENDATIONS   24 to 48 hours for outcomes. [name] asks about in-home hospice services in the future.  Code  Status/Advance Care Planning:  DNR -we discussed the concept of treat the treatable but no extraordinary measures.  [name] states that she wants to treat the treatable, but if she worsens and is dying we should let her have peace, no CPR, no intubation.  [name] states that she does not like the BiPAP, but is willing to continue use at least for a few more days, as needed.  I asked Dorene SorrowJerry if he could abide this decision, he states that he could.  Symptom Management:   Per hospitalist, no additional needs at this time.  Palliative Prophylaxis:   Aspiration and Turn Reposition  Additional Recommendations (Limitations, Scope, Preferences):  treat the treatable, no CPR, No intubation, ok for BiPAP  Psycho-social/Spiritual:   Desire for further Chaplaincy support:no  Additional Recommendations: Caregiving  Support/Resources and Education on Hospice  Prognosis:     Unable to determine, based on outcome.  Likely to survive this hospitalization, but 6-12 months or less would not be surprising.  Discharge Planning: Anticipate home with home health if needed.      Primary Diagnoses: Present on Admission: . Sepsis due to undetermined organism (HCC) . Lactic acidosis . Malnutrition of moderate degree (HCC) . Dysphagia, pharyngoesophageal phase . Hyponatremia . Elevated AST (SGOT) . Acute respiratory failure (HCC) . Sinus tachycardia . Hyperglycemia . Arterial hypotension   I have reviewed the medical record, interviewed the patient and family, and examined the patient. The following aspects are pertinent.  Past Medical History:  Diagnosis Date  . Candida esophagitis (HCC) APR 2016  . Dysphagia   . Helicobacter pylori gastritis APR 2016   treated with amox and biaxin  . Neuropathy   . Osteoporosis   . Vertigo    Social History   Socioeconomic History  . Marital status: Widowed    Spouse name: Not on file  . Number of children: Not on file  . Years of education: Not on file  . Highest education level: Not on file  Occupational History  . Not on file  Social Needs  . Financial resource strain: Not on file  . Food insecurity:    Worry: Not on file    Inability: Not on file  . Transportation needs:    Medical: Not on file    Non-medical: Not on file  Tobacco Use  . Smoking status: Never Smoker  . Smokeless tobacco: Never Used  Substance and Sexual Activity  . Alcohol use: No    Alcohol/week: 0.0 oz  . Drug use: No  . Sexual activity: Not on file  Lifestyle  . Physical activity:    Days per week: Not on file    Minutes per session: Not on file  . Stress: Not on file  Relationships  . Social connections:    Talks on phone: Not on file    Gets together: Not on file    Attends religious service: Not on file    Active member of club or organization: Not on file    Attends meetings of clubs or organizations: Not on file     Relationship status: Not on file  Other Topics Concern  . Not on file  Social History Narrative  . Not on file   Family History  Problem Relation Age of Onset  . Angina Mother   . Hypothyroidism Mother   . Melanoma Mother   . Heart failure Father   . Melanoma Sister   . Prostate cancer Brother   . Colon cancer Neg Hx  Scheduled Meds: . chlorhexidine  15 mL Mouth Rinse BID  . enoxaparin (LOVENOX) injection  40 mg Subcutaneous Q24H  . ipratropium-albuterol  3 mL Nebulization Q6H  . mouth rinse  15 mL Mouth Rinse q12n4p   Continuous Infusions: . sodium chloride 75 mL/hr at 05/01/18 1117  . amiodarone 60 mg/hr (05/01/18 1217)   Followed by  . amiodarone    . piperacillin-tazobactam (ZOSYN)  IV Stopped (05/01/18 0947)  . vancomycin     PRN Meds:.acetaminophen **OR** acetaminophen, albuterol, ondansetron **OR** ondansetron (ZOFRAN) IV Medications Prior to Admission:  Prior to Admission medications   Medication Sig Start Date End Date Taking? Authorizing Provider  ALPRAZolam Prudy Feeler) 0.5 MG tablet Take 1 tablet (0.5 mg total) by mouth 3 (three) times daily as needed for anxiety. 07/21/15   Kari Baars, MD  amoxicillin (AMOXIL) 250 MG/5ML suspension  05-26-2018   [provider]  calcium carbonate, dosed in mg elemental calcium, 1250 (500 CA) MG/5ML Place 5 mLs (500 mg of elemental calcium total) into feeding tube 2 (two) times daily with a meal. 07/21/15   Kari Baars, MD  gabapentin (NEURONTIN) 300 MG capsule Take 1 capsule by mouth 3 (three) times daily. 04/22/18   [provider]  magnesium citrate SOLN 1 cc twice daily 09/22/15   Tiffany Kocher, PA-C  meclizine (ANTIVERT) 25 MG tablet Take 25 mg by mouth at bedtime.    [provider]  Nutritional Supplements (FEEDING SUPPLEMENT, JEVITY 1.2 CAL,) LIQD Place 1,000 mLs into feeding tube daily. 07/21/15   Kari Baars, MD  nystatin cream (MYCOSTATIN) APPLY TO AFFECTED AREAS TWICE DAILY. USE WITH  TRIAMCINOLONE CREAM. 02/13/18   Setzer, Brand Males, NP  Potassium Chloride 40 MEQ/15ML (20%) SOLN Take 15 mLs by mouth 2 (two) times daily. 09/13/15   [provider]  ranitidine (ZANTAC) 15 MG/ML syrup Take 150 mg by mouth 2 (two) times daily.     [provider]  silver nitrate applicators 75-25 % applicator Apply topically daily. Apply silver nitrate solution to granulation tissue 3 times a week as directed until it is completely resolved. 04/04/16   Rehman, Joline Maxcy, MD  triamcinolone cream (KENALOG) 0.1 % APPLY TO AFFECTED AREAS TWICE DAILY. USE WITH NYSTATIN CREAM. 03/18/18   Rehman, Joline Maxcy, MD   No Known Allergies Review of Systems  Unable to perform ROS: Severe respiratory distress    Physical Exam  Constitutional: She is oriented to person, place, and time.  Appears frail, acutely/chronically ill, makes and keeps eye contact.  HENT:  Head: Atraumatic.  Cardiovascular: Normal rate.  Pulmonary/Chest:  Able to make sentences, mild work of breathing noted  Abdominal: Soft. She exhibits no distension.  PEG tube site  Musculoskeletal: She exhibits no edema.  Neurological: She is alert and oriented to person, place, and time.  Skin: Skin is warm and dry.  Psychiatric:  Calm and cooperative, not fearful  Nursing note and vitals reviewed.   Vital Signs: BP (!) 102/58   Pulse (!) 101   Temp 99 F (37.2 C)   Resp (!) 21   Ht 5\' 5"  (1.651 m)   Wt 57.3 kg (126 lb 5.2 oz)   SpO2 97%   BMI 21.02 kg/m  Pain Scale: 0-10 POSS *See Group Information*: 1-Acceptable,Awake and alert Pain Score: 0-No pain   SpO2: SpO2: 97 % O2 Device:SpO2: 97 % O2 Flow Rate: .   IO: Intake/output summary:   Intake/Output Summary (Last 24 hours) at 05/01/2018 1357 Last data filed at  05/01/2018 1117 Gross per 24 hour  Intake 3124.16 ml  Output 1000 ml  Net 2124.16 ml    LBM: Last BM Date: (unknown) Baseline Weight: Weight: 54.4 kg (120 lb) Most recent weight: Weight: 57.3 kg (126  lb 5.2 oz)     Palliative Assessment/Data:   Flowsheet Rows     Most Recent Value  Intake Tab  Referral Department  Hospitalist  Unit at Time of Referral  Intermediate Care Unit  Palliative Care Primary Diagnosis  Sepsis/Infectious Disease  Date Notified  05/01/18  Palliative Care Type  New Palliative care  Reason for referral  Clarify Goals of Care  Date of Admission  04/29/2018  Date first seen by Palliative Care  05/01/18  # of days Palliative referral response time  0 Day(s)  # of days IP prior to Palliative referral  1  Clinical Assessment  Palliative Performance Scale Score  40%  Pain Max last 24 hours  Not able to report  Pain Min Last 24 hours  Not able to report  Dyspnea Max Last 24 Hours  Not able to report  Dyspnea Min Last 24 hours  Not able to report  Psychosocial & Spiritual Assessment  Palliative Care Outcomes  Patient/Family meeting held?  Yes  Who was at the meeting?  Patient and son Dorene Sorrow at bedside  Patient/Family wishes: Interventions discontinued/not started   Mechanical Ventilation      Time In: 0920 Time Out: 1030 Time Total: 70 minutes Greater than 50%  of this time was spent counseling and coordinating care related to the above assessment and plan.  Signed by: Katheran Awe, NP   Please contact Palliative Medicine Team phone at [phone] for questions and concerns.  For individual provider: See Loretha Stapler

## 2018-05-01 NOTE — Progress Notes (Signed)
Night shift SDU coverage.  [facility] nursing staff asked our staff if the patient may need pressors. I briefly discussed the case with Dr. Marchelle Gearingamaswamy.  Basically the patient had a systolic blood pressure in the 120s and 130s while she was febrile and tachycardic.  After the fever and tachycardia resolved, when the patient is awake her SBP has been in the 90s to low 100s range.  When the patient goes to sleep, then the patient's blood pressure decreases as she relaxes her muscle tone.  I believe that the patient normally runs lower blood pressures unusual.  She has had urine output of over 1000 mL since she got to the unit.  I will continue the IV fluids and careful monitoring for now.  However, should the blood pressure continue to decrease or decreases below 90 while she is awake, pressors should be considered.  Sanda Kleinavid Ortiz, MD.

## 2018-05-01 NOTE — Progress Notes (Signed)
*  PRELIMINARY RESULTS* Echocardiogram 2D Echocardiogram has been performed.  Stacey DrainWhite, Gio Janoski J 05/01/2018, 2:46 PM

## 2018-05-01 NOTE — Progress Notes (Signed)
Pt is now awake, alert and answering questions. BP increased to 90/53 when awake. Pt states her BP runs low. Dr. Robb Matarrtiz paged and made aware.

## 2018-05-01 NOTE — Progress Notes (Signed)
CRITICAL VALUE ALERT  Critical Value:  Lactic Acid 3.4  Date & Time Notied:  05/01/18 @ 0622  Provider Notified: Dr. Robb Matarrtiz  Orders Received/Actions taken: Waiting for call back/orders

## 2018-05-01 NOTE — Progress Notes (Signed)
[facility] called and reported that blood cultures show MRSA. MD notified via text page. Patient already on vanc and zosyn

## 2018-05-01 NOTE — Progress Notes (Signed)
Peripherally Inserted Central Catheter/Midline Placement  The IV Nurse has discussed with the patient and/or persons authorized to consent for the patient, the purpose of this procedure and the potential benefits and risks involved with this procedure.  The benefits include less needle sticks, lab draws from the catheter, and the patient may be discharged home with the catheter. Risks include, but not limited to, infection, bleeding, blood clot (thrombus formation), and puncture of an artery; nerve damage and irregular heartbeat and possibility to perform a PICC exchange if needed/ordered by physician.  Alternatives to this procedure were also discussed.  Bard Power PICC patient education guide, fact sheet on infection prevention and patient information card has been provided to patient /or left at bedside.    PICC/Midline Placement Documentation        Nancy Horn, Nancy Horn 05/01/2018, 5:42 PM

## 2018-05-01 NOTE — Progress Notes (Signed)
Dr. Robb Matarrtiz paged and made aware of Lactic acid of 3.1- went down from 5.09.

## 2018-05-01 NOTE — Progress Notes (Signed)
Dr. Robb Matarrtiz paged and made aware that [facility] has called stating the pt needs to be on pressors. Adv MD is aware of pts pressure and has gotten several boluses as well as MD stating pts normal Bp is in 80s and 90s while awake. Waiting for orders/call back. Will continue to monitor pt

## 2018-05-01 NOTE — Progress Notes (Addendum)
SLP Cancellation Note  Patient Details Name: Nancy Horn MRN: [id number] DOB: November 13, 1925   Cancelled treatment:      SLP attempted BSE ordered; however, Pt on BiPAP.  Pt has a history of esophageal dysphagia and has PEG for nutrition.  Pt was alert and requested pen and paper.  She wrote that she takes nothing by mouth, other than brushing teeth and spits out any paste and/or water and family is on there way here from out of town. Pt diagnosed with pneumonia with comprised respiratory function and is at high risk for aspiration.  SLP discussed Pt history with nurse, Lyman BishopLawrence who stated PEG was leaking.  Recommend continued NPO. SLP will follow up for complete BSE tomorrow, if appropriate.          Athena MasseAngela Ryota Treece  M.A., CCC-SLP [email or web address]     Iliya Spivack W Oswald Pott 05/01/2018, 10:00 AM

## 2018-05-01 NOTE — Progress Notes (Signed)
Subjective: She was admitted yesterday with pneumonia presumably from aspiration and sepsis from that.  Her last lactate level was 3.4 and she is still hypotensive.  She does run a relatively low blood pressure at baseline.  She is on BiPAP and 75% oxygen.  She says she feels okay.  She has significant trouble with swallowing at baseline and I think she is probably aspirated.  She has a feeding tube for nutrition.  Objective: Vital signs in last 24 hours: Temp:  [96.8 F (36 C)-103.2 F (39.6 C)] 97.2 F (36.2 C) (06/19 0645) Pulse Rate:  [47-130] 81 (06/19 0645) Resp:  [14-40] 15 (06/19 0645) BP: (69-128)/(37-66) 80/40 (06/19 0645) SpO2:  [92 %-100 %] 97 % (06/19 0432) FiO2 (%):  [75 %] 75 % (06/19 0312) Weight:  [54.4 kg (120 lb)-57.3 kg (126 lb 5.2 oz)] 57.3 kg (126 lb 5.2 oz) (06/19 0000) Weight change:  Last BM Date: (unknown)  Intake/Output from previous day: 06/18 0701 - 06/19 0700 In: 2074.2 [I.V.:221.7; IV Piggyback:1852.5] Out: 1000 [Urine:1000]  PHYSICAL EXAM General appearance: alert, cooperative and On BiPAP so communication is difficult Resp: wheezes bilaterally Cardio: regular rate and rhythm, S1, S2 normal, no murmur, click, rub or gallop GI: soft, non-tender; bowel sounds normal; no masses,  no organomegaly Extremities: extremities normal, atraumatic, no cyanosis or edema She has increased respiratory effort Lab Results:  Results for orders placed or performed during the hospital encounter of 04/22/2018 (from the past 48 hour(s))  Urinalysis, Routine w reflex microscopic     Status: Abnormal   Collection Time: 04/29/2018  8:05 PM  Result Value Ref Range   Color, Urine YELLOW YELLOW   APPearance HAZY (A) CLEAR   Specific Gravity, Urine 1.018 1.005 - 1.030   pH 7.0 5.0 - 8.0   Glucose, UA >=500 (A) NEGATIVE mg/dL   Hgb urine dipstick NEGATIVE NEGATIVE   Bilirubin Urine NEGATIVE NEGATIVE   Ketones, ur NEGATIVE NEGATIVE mg/dL   Protein, ur 30 (A) NEGATIVE mg/dL    Nitrite NEGATIVE NEGATIVE   Leukocytes, UA NEGATIVE NEGATIVE   RBC / HPF 0-5 0 - 5 RBC/hpf   WBC, UA 0-5 0 - 5 WBC/hpf   Bacteria, UA NONE SEEN NONE SEEN   Squamous Epithelial / LPF 0-5 0 - 5   Mucus PRESENT    Amorphous Crystal PRESENT     Comment: Performed at [facility], [address] [place], [place] [address]  Comprehensive metabolic panel     Status: Abnormal   Collection Time: 04/19/2018  8:09 PM  Result Value Ref Range   Sodium 130 (L) 135 - 145 mmol/L   Potassium 3.9 3.5 - 5.1 mmol/L   Chloride 93 (L) 101 - 111 mmol/L   CO2 27 22 - 32 mmol/L   Glucose, Bld 281 (H) 65 - 99 mg/dL   BUN 30 (H) 6 - 20 mg/dL   Creatinine, Ser 0.93 0.44 - 1.00 mg/dL   Calcium 9.3 8.9 - 10.3 mg/dL   Total Protein 7.2 6.5 - 8.1 g/dL   Albumin 3.2 (L) 3.5 - 5.0 g/dL   AST 58 (H) 15 - 41 U/L   ALT 32 14 - 54 U/L   Alkaline Phosphatase 113 38 - 126 U/L   Total Bilirubin 0.6 0.3 - 1.2 mg/dL   GFR calc non Af Amer 53 (L) >60 mL/min   GFR calc Af Amer >60 >60 mL/min    Comment: (NOTE) The eGFR has been calculated using the CKD EPI equation. This calculation  has not been validated in all clinical situations. eGFR's persistently <60 mL/min signify possible Chronic Kidney Disease.    Anion gap 10 5 - 15    Comment: Performed at [facility], [address] [place], [place] [address]  CBC WITH DIFFERENTIAL     Status: Abnormal   Collection Time: 04/13/2018  8:09 PM  Result Value Ref Range   WBC 8.1 4.0 - 10.5 K/uL   RBC 4.25 3.87 - 5.11 MIL/uL   Hemoglobin 13.7 12.0 - 15.0 g/dL   HCT 42.2 36.0 - 46.0 %   MCV 99.3 78.0 - 100.0 fL   MCH 32.2 26.0 - 34.0 pg   MCHC 32.5 30.0 - 36.0 g/dL   RDW 12.8 11.5 - 15.5 %   Platelets 139 (L) 150 - 400 K/uL   Neutrophils Relative % 87 %   Neutro Abs 7.1 1.7 - 7.7 K/uL   Lymphocytes Relative 8 %   Lymphs Abs 0.7 0.7 - 4.0 K/uL   Monocytes Relative 5 %   Monocytes Absolute 0.4 0.1 - 1.0 K/uL   Eosinophils Relative 0 %   Eosinophils Absolute 0.0 0.0 - 0.7  K/uL   Basophils Relative 0 %   Basophils Absolute 0.0 0.0 - 0.1 K/uL    Comment: Performed at [facility], [address] [place], [place] [address]  Blood Culture (routine x 2)     Status: None (Preliminary result)   Collection Time: 04/22/2018  8:09 PM  Result Value Ref Range   Specimen Description BLOOD LEFT HAND    Special Requests      BOTTLES DRAWN AEROBIC AND ANAEROBIC Blood Culture results may not be optimal due to an inadequate volume of blood received in culture bottles Performed at [facility], [address] [place], [place] [address]    Culture PENDING    Report Status PENDING   Lipase, blood     Status: None   Collection Time: 04/14/2018  8:09 PM  Result Value Ref Range   Lipase 25 11 - 51 U/L    Comment: Performed at [facility], [address], [place], [place] [address]  Brain natriuretic peptide     Status: Abnormal   Collection Time: 04/13/2018  8:09 PM  Result Value Ref Range   B Natriuretic Peptide 285.0 (H) 0.0 - 100.0 pg/mL    Comment: Performed at [facility], [address], [place],  [address]  I-Stat CG4 Lactic Acid, ED  (not at  [facility])     Status: Abnormal   Collection Time: 04/26/2018  9:20 PM  Result Value Ref Range   Lactic Acid, Venous 3.89 (HH) 0.5 - 1.9 mmol/L  Blood Culture (routine x 2)     Status: None (Preliminary result)   Collection Time: 05/12/2018  9:20 PM  Result Value Ref Range   Specimen Description BLOOD LEFT HAND    Special Requests      BOTTLES DRAWN AEROBIC AND ANAEROBIC Blood Culture adequate volume Performed at [facility], [address] [place],  [address]    Culture PENDING    Report Status PENDING   I-Stat CG4 Lactic Acid, ED  (not at  [facility])     Status: Abnormal   Collection Time: 05/06/2018  9:59 PM  Result Value Ref Range   Lactic Acid, Venous 5.09 (HH) 0.5 - 1.9 mmol/L  MRSA PCR Screening     Status: None   Collection Time: 04/16/2018 11:40 PM  Result Value Ref Range   MRSA by PCR NEGATIVE NEGATIVE  Comment:         The GeneXpert MRSA Assay (FDA approved for NASAL specimens only), is one component of a comprehensive MRSA colonization surveillance program. It is not intended to diagnose MRSA infection nor to guide or monitor treatment for MRSA infections. Performed at [facility], [address], [place], [place] [address]   Lactic acid, plasma     Status: Abnormal   Collection Time: 05/01/18  1:24 AM  Result Value Ref Range   Lactic Acid, Venous 3.1 (HH) 0.5 - 1.9 mmol/L    Comment: CRITICAL RESULT CALLED TO, READ BACK BY AND VERIFIED WITH: DR.ORTIZ @ 0302 ON 6.19.19 BY BOWMAN,L Performed at [facility], [address], [place], [place] [address]   Glucose, capillary     Status: Abnormal   Collection Time: 05/01/18  3:56 AM  Result Value Ref Range   Glucose-Capillary 186 (H) 65 - 99 mg/dL  CBC with Differential     Status: Abnormal   Collection Time: 05/01/18  5:41 AM  Result Value Ref Range   WBC 5.7 4.0 - 10.5 K/uL   RBC 3.80 (L) 3.87 - 5.11 MIL/uL   Hemoglobin 12.3 12.0 - 15.0 g/dL   HCT 37.9 36.0 - 46.0 %   MCV 99.7 78.0 - 100.0 fL   MCH 32.4 26.0 - 34.0 pg   MCHC 32.5 30.0 - 36.0 g/dL   RDW 12.9 11.5 - 15.5 %   Platelets 105 (L) 150 - 400 K/uL    Comment: SPECIMEN CHECKED FOR CLOTS PLATELET COUNT CONFIRMED BY SMEAR    Neutrophils Relative % 88 %   Lymphocytes Relative 8 %   Monocytes Relative 4 %   Eosinophils Relative 0 %   Basophils Relative 0 %   Neutro Abs 5.0 1.7 - 7.7 K/uL   Lymphs Abs 0.5 (L) 0.7 - 4.0 K/uL   Monocytes Absolute 0.2 0.1 - 1.0 K/uL   Eosinophils Absolute 0.0 0.0 - 0.7 K/uL   Basophils Absolute 0.0 0.0 - 0.1 K/uL   WBC Morphology MILD LEFT SHIFT (1-5% METAS, OCC MYELO, OCC BANDS)     Comment: Performed at [facility], [address] [place], [place] [address]  Comprehensive metabolic panel     Status: Abnormal   Collection Time: 05/01/18  5:41 AM  Result Value Ref Range   Sodium 133 (L) 135 - 145 mmol/L   Potassium 4.1 3.5 - 5.1 mmol/L    Chloride 101 101 - 111 mmol/L   CO2 25 22 - 32 mmol/L   Glucose, Bld 194 (H) 65 - 99 mg/dL   BUN 26 (H) 6 - 20 mg/dL   Creatinine, Ser 0.89 0.44 - 1.00 mg/dL   Calcium 8.6 (L) 8.9 - 10.3 mg/dL   Total Protein 5.6 (L) 6.5 - 8.1 g/dL   Albumin 2.4 (L) 3.5 - 5.0 g/dL   AST 52 (H) 15 - 41 U/L   ALT 27 14 - 54 U/L   Alkaline Phosphatase 89 38 - 126 U/L   Total Bilirubin 0.7 0.3 - 1.2 mg/dL   GFR calc non Af Amer 55 (L) >60 mL/min   GFR calc Af Amer >60 >60 mL/min    Comment: (NOTE) The eGFR has been calculated using the CKD EPI equation. This calculation has not been validated in all clinical situations. eGFR's persistently <60 mL/min signify possible Chronic Kidney Disease.    Anion gap 7 5 - 15    Comment: Performed at [facility], [address] [place], [place] [address]  Lactic acid, plasma     Status: Abnormal   Collection Time: 05/01/18  5:41 AM  Result Value Ref Range   Lactic Acid, Venous 3.4 (HH) 0.5 - 1.9 mmol/L    Comment: CRITICAL RESULT CALLED TO, READ BACK BY AND VERIFIED WITH: WAGONER,R AT 6:20AM ON 05/01/18 BY Derrill Memo Performed at [facility], [address] [place], [place] [address]   Glucose, capillary     Status: Abnormal   Collection Time: 05/01/18  5:58 AM  Result Value Ref Range   Glucose-Capillary 196 (H) 65 - 99 mg/dL    ABGS No results for input(s): PHART, PO2ART, TCO2, HCO3 in the last 72 hours.  Invalid input(s): PCO2 CULTURES Recent Results (from the past 240 hour(s))  Blood Culture (routine x 2)     Status: None (Preliminary result)   Collection Time: 05/08/2018  8:09 PM  Result Value Ref Range Status   Specimen Description BLOOD LEFT HAND  Final   Special Requests   Final    BOTTLES DRAWN AEROBIC AND ANAEROBIC Blood Culture results may not be optimal due to an inadequate volume of blood received in culture bottles Performed at [facility], [address] [place], [place] [address]    Culture PENDING  Incomplete   Report Status PENDING   Incomplete  Blood Culture (routine x 2)     Status: None (Preliminary result)   Collection Time: 04/18/2018  9:20 PM  Result Value Ref Range Status   Specimen Description BLOOD LEFT HAND  Final   Special Requests   Final    BOTTLES DRAWN AEROBIC AND ANAEROBIC Blood Culture adequate volume Performed at [facility], [address] [place], [place] [address]    Culture PENDING  Incomplete   Report Status PENDING  Incomplete  MRSA PCR Screening     Status: None   Collection Time: 04/18/2018 11:40 PM  Result Value Ref Range Status   MRSA by PCR NEGATIVE NEGATIVE Final    Comment:        The GeneXpert MRSA Assay (FDA approved for NASAL specimens only), is one component of a comprehensive MRSA colonization surveillance program. It is not intended to diagnose MRSA infection nor to guide or monitor treatment for MRSA infections. Performed at [facility], [address] [place], [place] [address]    Studies/Results: Ct Angio Chest Pe W Or Wo Contrast  Result Date: 05/01/2018 CLINICAL DATA:  84 y/o  F; shortness of breath and hypoxia. EXAM: CT ANGIOGRAPHY CHEST WITH CONTRAST TECHNIQUE: Multidetector CT imaging of the chest was performed using the standard protocol during bolus administration of intravenous contrast. Multiplanar CT image reconstructions and MIPs were obtained to evaluate the vascular anatomy. CONTRAST:  112m ISOVUE-370 IOPAMIDOL (ISOVUE-370) INJECTION 76% COMPARISON:  05/08/2018 chest radiograph FINDINGS: Cardiovascular: Satisfactory opacification of the pulmonary arteries to the segmental level. No evidence of pulmonary embolism. Normal heart size. No pericardial effusion. Calcific atherosclerosis of the aorta. Mediastinum/Nodes: Mild lower paratracheal, hilar, and AP window lymphadenopathy, likely reactive. Severe dilatation of the upper esophagus mild dilatation of the lower esophagus with fluid level. Normal thyroid gland. Lungs/Pleura: Small areas of consolidation within the  bilateral upper lobes, left lower lobe lobar consolidation, and multi segmental consolidation of the right lower lobe with air bronchograms compatible with pneumonia. No pleural effusion or pneumothorax. Upper Abdomen: No acute abnormality. Musculoskeletal: No chest wall abnormality. No acute or significant osseous findings. Review of the MIP images confirms the above findings. IMPRESSION: 1. No pulmonary embolus identified. 2. Consolidation throughout the  dependent left-greater-than-right lungs compatible with pneumonia. 3. Aortic atherosclerosis. 4. Marked esophageal dilatation, question lower esophageal or gastroesophageal junction obstruction. Electronically Signed   By: Kristine Garbe M.D.   On: 05/01/2018 00:33   Dg Chest Port 1 View  Result Date: 04/29/2018 CLINICAL DATA:  Shortness of breath. EXAM: PORTABLE CHEST 1 VIEW COMPARISON:  PA and lateral chest 07/13/2015. FINDINGS: There are small bilateral pleural effusions and basilar airspace disease, worse on the left. No pneumothorax. Heart size is normal. Aortic atherosclerosis is noted. Marked dilatation of the esophagus as seen on prior exam is unchanged. IMPRESSION: Left greater than right small effusions and basilar airspace disease, likely atelectasis. Marked dilatation of the esophagus is chronic and likely due to achalasia. Electronically Signed   By: Inge Rise M.D.   On: 04/17/2018 21:16    Medications:  Prior to Admission:  Medications Prior to Admission  Medication Sig Dispense Refill Last Dose  . ALPRAZolam (XANAX) 0.5 MG tablet Take 1 tablet (0.5 mg total) by mouth 3 (three) times daily as needed for anxiety.  0 05/22/2017 at Unknown time  . calcium carbonate, dosed in mg elemental calcium, 1250 (500 CA) MG/5ML Place 5 mLs (500 mg of elemental calcium total) into feeding tube 2 (two) times daily with a meal. 450 mL  05/23/2017 at Unknown time  . gabapentin (NEURONTIN) 100 MG capsule Take 600 mg by mouth at bedtime.   11  05/22/2017 at Unknown time  . magnesium citrate SOLN 1 cc twice daily   05/22/2017 at Unknown time  . meclizine (ANTIVERT) 25 MG tablet Take 25 mg by mouth at bedtime.   05/23/2017 at Unknown time  . Nutritional Supplements (FEEDING SUPPLEMENT, JEVITY 1.2 CAL,) LIQD Place 1,000 mLs into feeding tube daily.  0 05/23/2017 at Unknown time  . nystatin cream (MYCOSTATIN) APPLY TO AFFECTED AREAS TWICE DAILY. USE WITH TRIAMCINOLONE CREAM. 15 g 0   . Potassium Chloride 40 MEQ/15ML (20%) SOLN Take 15 mLs by mouth 2 (two) times daily.   05/23/2017 at Unknown time  . ranitidine (ZANTAC) 15 MG/ML syrup Take 150 mg by mouth 2 (two) times daily.    05/22/2017 at Unknown time  . silver nitrate applicators 79-89 % applicator Apply topically daily. Apply silver nitrate solution to granulation tissue 3 times a week as directed until it is completely resolved. 100 each 0 Past Week at Unknown time  . triamcinolone cream (KENALOG) 0.1 % APPLY TO AFFECTED AREAS TWICE DAILY. USE WITH NYSTATIN CREAM. 15 g 0    Scheduled: . chlorhexidine  15 mL Mouth Rinse BID  . enoxaparin (LOVENOX) injection  40 mg Subcutaneous Q24H  . ipratropium-albuterol  3 mL Nebulization Q6H  . mouth rinse  15 mL Mouth Rinse q12n4p   Continuous: . lactated ringers 75 mL/hr at 05/01/18 0442  . piperacillin-tazobactam (ZOSYN)  IV 3.375 g (05/01/18 0552)  . vancomycin     QJJ:HERDEYCXKGYJE **OR** acetaminophen, albuterol, ondansetron **OR** ondansetron (ZOFRAN) IV  Assesment: She has sepsis presumably from pneumonia.  She has dysphagia at baseline and has a feeding tube because she is not able to maintain her nutrition orally.  She is on appropriate antibiotics.  She still has lactic acidosis but it has come down some.  She has acute respiratory failure on BiPAP now.  She is on 75% oxygen and her oxygenation is adequate.  She has elevated liver function testing probably from the sepsis  She has hyponatremia probably from sepsis  She is still  somewhat hypotensive but she  has a relatively low blood pressure at baseline.  I am going to see what her lactic acid level does.  She has poor venous access so I am going to see about getting a PICC line  She has multiple system failure and she is critically ill.  I will request palliative care consultation for goal setting. Principal Problem:   Sepsis due to undetermined organism [facility] Active Problems:   Dysphagia, pharyngoesophageal phase   Malnutrition of moderate degree (HCC)   Lactic acidosis   Hyponatremia   Elevated AST (SGOT)   Acute respiratory failure (HCC)   Sinus tachycardia   Hyperglycemia   Arterial hypotension    Plan: As above    LOS: 1 day   Konstantinos Cordoba L 05/01/2018, 8:08 AM

## 2018-05-01 NOTE — Progress Notes (Signed)
Asked to see patient regarding leaking gastrostomy tube. Patient well-known to me. Patient now in ICU for sepsis acute respiratory failure secondary to pneumonia most likely due to aspiration of oral secretions. She has been fed via PEG for about 3 years because of severe esophageal dysmotility.  She also has been having difficulty handling her saliva. She was last seen on 03/20/2018 as she has some reflux of gastric contents around the tube with the peristomal inflammation.  Tube was tightened by adjusting the bolster and she has done well. Examination reveals resolution of peristomal dermatitis.  She has small amount of old blood at the stoma possibly originating from granulation tissue. Tube appears to be in good position and no adjustment or manipulation needed. Will recheck in a.m.

## 2018-05-02 LAB — BASIC METABOLIC PANEL
ANION GAP: 5 (ref 5–15)
BUN: 25 mg/dL — ABNORMAL HIGH (ref 6–20)
CO2: 25 mmol/L (ref 22–32)
Calcium: 8.4 mg/dL — ABNORMAL LOW (ref 8.9–10.3)
Chloride: 106 mmol/L (ref 101–111)
Creatinine, Ser: 0.68 mg/dL (ref 0.44–1.00)
Glucose, Bld: 114 mg/dL — ABNORMAL HIGH (ref 65–99)
POTASSIUM: 3.7 mmol/L (ref 3.5–5.1)
Sodium: 136 mmol/L (ref 135–145)

## 2018-05-02 LAB — BLOOD CULTURE ID PANEL (REFLEXED)

## 2018-05-02 LAB — CBC WITH DIFFERENTIAL/PLATELET
BASOS ABS: 0 10*3/uL (ref 0.0–0.1)
BASOS PCT: 0 %
Eosinophils Absolute: 0 10*3/uL (ref 0.0–0.7)
Eosinophils Relative: 0 %
HEMATOCRIT: 36.6 % (ref 36.0–46.0)
Hemoglobin: 11.4 g/dL — ABNORMAL LOW (ref 12.0–15.0)
LYMPHS PCT: 6 %
Lymphs Abs: 0.6 10*3/uL — ABNORMAL LOW (ref 0.7–4.0)
MCH: 31.1 pg (ref 26.0–34.0)
MCHC: 31.1 g/dL (ref 30.0–36.0)
MCV: 99.7 fL (ref 78.0–100.0)
Monocytes Absolute: 0.6 10*3/uL (ref 0.1–1.0)
Monocytes Relative: 6 %
NEUTROS ABS: 9.4 10*3/uL — AB (ref 1.7–7.7)
NEUTROS PCT: 88 %
PLATELETS: 135 10*3/uL — AB (ref 150–400)
RBC: 3.67 MIL/uL — AB (ref 3.87–5.11)
RDW: 13.2 % (ref 11.5–15.5)
WBC: 10.7 10*3/uL — AB (ref 4.0–10.5)

## 2018-05-02 LAB — GLUCOSE, CAPILLARY
GLUCOSE-CAPILLARY: 106 mg/dL — AB (ref 65–99)
Glucose-Capillary: 68 mg/dL (ref 65–99)
Glucose-Capillary: 108 mg/dL — ABNORMAL HIGH (ref 65–99)
Glucose-Capillary: 182 mg/dL — ABNORMAL HIGH (ref 65–99)

## 2018-05-02 LAB — URINE CULTURE
Culture: NO GROWTH
Special Requests: NORMAL

## 2018-05-02 MED ORDER — SODIUM CHLORIDE 0.9% FLUSH: 10.0000 mL | INTRAVENOUS | Status: DC | PRN

## 2018-05-02 MED ORDER — INSULIN ASPART 100 UNIT/ML ~~LOC~~ SOLN
0.0000 [IU] | Freq: Three times a day (TID) | SUBCUTANEOUS | Status: DC
  Administered 2018-05-03 – 2018-05-04 (×3): 2 [IU] via SUBCUTANEOUS
  Administered 2018-05-04: 1 [IU] via SUBCUTANEOUS
  Administered 2018-05-05 (×3): 2 [IU] via SUBCUTANEOUS

## 2018-05-02 MED ORDER — CHLORHEXIDINE GLUCONATE CLOTH 2 % EX PADS
6.0000 | MEDICATED_PAD | Freq: Every day | CUTANEOUS | Status: DC
  Administered 2018-05-02 – 2018-05-05 (×4): 6 via TOPICAL

## 2018-05-02 MED ORDER — ALPRAZOLAM 0.5 MG PO TABS
0.5000 mg | ORAL_TABLET | Freq: Two times a day (BID) | ORAL | Status: DC | PRN
  Administered 2018-05-02: 0.5 mg via ORAL
  Filled 2018-05-02: qty 1

## 2018-05-02 MED ORDER — SODIUM CHLORIDE 0.9% FLUSH
10.0000 mL | Freq: Two times a day (BID) | INTRAVENOUS | Status: DC
  Administered 2018-05-02 – 2018-05-07 (×10): 10 mL

## 2018-05-02 MED ORDER — JEVITY 1.2 CAL PO LIQD
1000.0000 mL | ORAL | Status: DC
  Administered 2018-05-02: 1000 mL
  Filled 2018-05-02 (×5): qty 1000

## 2018-05-02 NOTE — Progress Notes (Signed)
Palliative:  Granddaughter, Nancy Horn is seen leaving the room.  We talked about Nancy Horn's acute and chronic illnesses, including pneumonia and bacteremia and the treatment plan.  I share a diagram of the chronic illness pathway, what is normal and expected.  We also talked in detail about home health services versus hospice services, what each entails.  We also review the difference between Medicare and Medicaid.  No further questions at this time.  Nancy Horn is resting quietly in bed.  She greets me making and keeping eye contact.  She is calm and pleasant, looks improved today.  She has several women at bedside from her church.  She tells me that she has not had anything to eat since Tuesday night.  I share that speech therapy will evaluate her, but if she would like something she may have it.  We talked about having the taste of ice cream in her mouth.  She states that she has not taken any food by mouth for several years (I have a nonfunctioning esophagus), and at this point does not want to.  She would like to restart her tube feeding.  I share with Nancy Horn that when she states that she has not had anything to eat, we did not understand that she meant she was ready to resume her tube feeding.    Nancy Horn states that she has been unable to rest at night.  She states the blood pressure cuff is checking her blood pressure too often, blood sugar checks are painful, and there is a lot of noise from BiPAP.  Conversation with nursing staff related to strategies to reduce interruptions, allowing rest; goal is to reduce risk of ICU psychosis.  Talked about the treatment plan, IV antibiotics (possibly for a few weeks), PICC line, nutritional support, BiPAP as she can tolerate/as needed.  We also talked about rehab if she qualifies/wants.  We also talked about home with home health.  Nancy Horn states that she would like hospice services at home if she qualifies, her sister had the services  and was very pleased.  I share that I agree, hospice is a wonderful service, I encouraged her to wait a few more days for outcomes.  Consultation with PCP, Dr. Juanetta GoslingHawkins.  Restarting tube feeding.  Consultation with registered dietitian Chalmers GuestFranks related to tube feeding regimen and goals.  Nancy Horn states that she takes Jevity, 2 cans in the morning, 1 can at lunch, 2 cans in the evening around 6 or 630.  She does flush her tube before and after. 40 minutes Nancy Carmelasha Jalyric Kaestner, NP Palliative Medicine Team Team Phone # [phone]

## 2018-05-02 NOTE — Progress Notes (Signed)
Subjective: She was admitted with pneumonia and sepsis.  She is growing what appears to be MRSA in the blood.  She does not like wearing the BiPAP.  She says the alarms kept her up all night.  She does not like having her blood sugar done.  She is clearly better.  She wants to eat but I am going to wait for speech evaluation.  Objective: Vital signs in last 24 hours: Temp:  [97.3 F (36.3 C)-100.4 F (38 C)] 98.8 F (37.1 C) (06/20 0800) Pulse Rate:  [73-121] 79 (06/20 0800) Resp:  [15-32] 22 (06/20 0800) BP: (76-119)/(41-95) 92/44 (06/20 0800) SpO2:  [91 %-100 %] 100 % (06/20 0822) FiO2 (%):  [65 %-70 %] 65 % (06/20 0054) Weight:  [61.6 kg (135 lb 12.9 oz)] 61.6 kg (135 lb 12.9 oz) (06/20 0500) Weight change: 7.168 kg (15 lb 12.9 oz) Last BM Date: (unknown)  Intake/Output from previous day: 06/19 0701 - 06/20 0700 In: 3149.4 [I.V.:1860.9; IV Piggyback:1288.5] Out: 250 [Urine:250]  PHYSICAL EXAM General appearance: alert, cooperative and mild distress Resp: rhonchi bilaterally Cardio: regular rate and rhythm, S1, S2 normal, no murmur, click, rub or gallop GI: soft, non-tender; bowel sounds normal; no masses,  no organomegaly Extremities: extremities normal, atraumatic, no cyanosis or edema Her voice is very nasal  Lab Results:  Results for orders placed or performed during the hospital encounter of 04/25/2018 (from the past 48 hour(s))  Urinalysis, Routine w reflex microscopic     Status: Abnormal   Collection Time: 04/27/2018  8:05 PM  Result Value Ref Range   Color, Urine YELLOW YELLOW   APPearance HAZY (A) CLEAR   Specific Gravity, Urine 1.018 1.005 - 1.030   pH 7.0 5.0 - 8.0   Glucose, UA >=500 (A) NEGATIVE mg/dL   Hgb urine dipstick NEGATIVE NEGATIVE   Bilirubin Urine NEGATIVE NEGATIVE   Ketones, ur NEGATIVE NEGATIVE mg/dL   Protein, ur 30 (A) NEGATIVE mg/dL   Nitrite NEGATIVE NEGATIVE   Leukocytes, UA NEGATIVE NEGATIVE   RBC / HPF 0-5 0 - 5 RBC/hpf   WBC, UA 0-5 0 -  5 WBC/hpf   Bacteria, UA NONE SEEN NONE SEEN   Squamous Epithelial / LPF 0-5 0 - 5   Mucus PRESENT    Amorphous Crystal PRESENT     Comment: Performed at [facility], [address] [place], [place] [address]  Comprehensive metabolic panel     Status: Abnormal   Collection Time: 04/17/2018  8:09 PM  Result Value Ref Range   Sodium 130 (L) 135 - 145 mmol/L   Potassium 3.9 3.5 - 5.1 mmol/L   Chloride 93 (L) 101 - 111 mmol/L   CO2 27 22 - 32 mmol/L   Glucose, Bld 281 (H) 65 - 99 mg/dL   BUN 30 (H) 6 - 20 mg/dL   Creatinine, Ser 0.93 0.44 - 1.00 mg/dL   Calcium 9.3 8.9 - 10.3 mg/dL   Total Protein 7.2 6.5 - 8.1 g/dL   Albumin 3.2 (L) 3.5 - 5.0 g/dL   AST 58 (H) 15 - 41 U/L   ALT 32 14 - 54 U/L   Alkaline Phosphatase 113 38 - 126 U/L   Total Bilirubin 0.6 0.3 - 1.2 mg/dL   GFR calc non Af Amer 53 (L) >60 mL/min   GFR calc Af Amer >60 >60 mL/min    Comment: (NOTE) The eGFR has been calculated using the CKD EPI equation. This calculation has not been validated in all clinical situations.  eGFR's persistently <60 mL/min signify possible Chronic Kidney Disease.    Anion gap 10 5 - 15    Comment: Performed at [facility], [address] [place], [place] [address]  CBC WITH DIFFERENTIAL     Status: Abnormal   Collection Time: 04/23/2018  8:09 PM  Result Value Ref Range   WBC 8.1 4.0 - 10.5 K/uL   RBC 4.25 3.87 - 5.11 MIL/uL   Hemoglobin 13.7 12.0 - 15.0 g/dL   HCT 42.2 36.0 - 46.0 %   MCV 99.3 78.0 - 100.0 fL   MCH 32.2 26.0 - 34.0 pg   MCHC 32.5 30.0 - 36.0 g/dL   RDW 12.8 11.5 - 15.5 %   Platelets 139 (L) 150 - 400 K/uL   Neutrophils Relative % 87 %   Neutro Abs 7.1 1.7 - 7.7 K/uL   Lymphocytes Relative 8 %   Lymphs Abs 0.7 0.7 - 4.0 K/uL   Monocytes Relative 5 %   Monocytes Absolute 0.4 0.1 - 1.0 K/uL   Eosinophils Relative 0 %   Eosinophils Absolute 0.0 0.0 - 0.7 K/uL   Basophils Relative 0 %   Basophils Absolute 0.0 0.0 - 0.1 K/uL    Comment: Performed at [facility], [address] [place], [place] [address]  Blood Culture (routine x 2)     Status: None (Preliminary result)   Collection Time: 05/01/2018  8:09 PM  Result Value Ref Range   Specimen Description      BLOOD LEFT HAND Performed at [facility], [address] [place], [place] [address]    Special Requests      BOTTLES DRAWN AEROBIC AND ANAEROBIC Blood Culture results may not be optimal due to an inadequate volume of blood received in culture bottles Performed at [facility], [address] [place], [place] [address]    Culture  Setup Time      GRAM POSITIVE COCCI Gram Stain Report Called to,Read Back By and Verified With: MOTLEY,J. AT 0109 ON 05/01/2018 BY EVA IN BOTH AEROBIC AND ANAEROBIC BOTTLES Performed at [place] ID to follow CRITICAL RESULT CALLED TO, READ BACK BY AND VERIFIED WITHLynwood Dawley RN 3235 05/01/18 A BROWNING Performed at [facility], [place] [address] [place], [place] [address]    Culture      NO GROWTH 2 DAYS Performed at [facility], [address], [place], [place] [address]    Report Status PENDING   Lipase, blood     Status: None   Collection Time: 05/09/2018  8:09 PM  Result Value Ref Range   Lipase 25 11 - 51 U/L    Comment: Performed at [facility], [address], [place], [place] [address]  Brain natriuretic peptide     Status: Abnormal   Collection Time: 04/13/2018  8:09 PM  Result Value Ref Range   B Natriuretic Peptide 285.0 (H) 0.0 - 100.0 pg/mL    Comment: Performed at [facility], [address], [place], [place] [address]  I-Stat CG4 Lactic Acid, ED  (not at  [facility])     Status: Abnormal   Collection Time: 04/16/2018  9:20 PM  Result Value Ref Range   Lactic Acid, Venous 3.89 (HH) 0.5 - 1.9 mmol/L  Blood Culture (routine x 2)     Status: None (Preliminary result)   Collection Time: 05/09/2018  9:20 PM  Result Value Ref Range   Specimen Description BLOOD LEFT ARM    Special Requests  BOTTLES DRAWN AEROBIC AND ANAEROBIC  Blood Culture adequate volume   Culture      NO GROWTH 2 DAYS Performed at [facility], [address], [place], [place] [address]    Report Status PENDING   I-Stat CG4 Lactic Acid, ED  (not at  [facility])     Status: Abnormal   Collection Time: 04/13/2018  9:59 PM  Result Value Ref Range   Lactic Acid, Venous 5.09 (HH) 0.5 - 1.9 mmol/L  MRSA PCR Screening     Status: None   Collection Time: 05/08/2018 11:40 PM  Result Value Ref Range   MRSA by PCR NEGATIVE NEGATIVE    Comment:        The GeneXpert MRSA Assay (FDA approved for NASAL specimens only), is one component of a comprehensive MRSA colonization surveillance program. It is not intended to diagnose MRSA infection nor to guide or monitor treatment for MRSA infections. Performed at [facility], [address], [place], [place] [address]   Lactic acid, plasma     Status: Abnormal   Collection Time: 05/01/18  1:24 AM  Result Value Ref Range   Lactic Acid, Venous 3.1 (HH) 0.5 - 1.9 mmol/L    Comment: CRITICAL RESULT CALLED TO, READ BACK BY AND VERIFIED WITH: DR.ORTIZ @ 0302 ON 6.19.19 BY BOWMAN,L Performed at [facility], [address], [place], [place] [address]   Glucose, capillary     Status: Abnormal   Collection Time: 05/01/18  3:56 AM  Result Value Ref Range   Glucose-Capillary 186 (H) 65 - 99 mg/dL  CBC with Differential     Status: Abnormal   Collection Time: 05/01/18  5:41 AM  Result Value Ref Range   WBC 5.7 4.0 - 10.5 K/uL   RBC 3.80 (L) 3.87 - 5.11 MIL/uL   Hemoglobin 12.3 12.0 - 15.0 g/dL   HCT 37.9 36.0 - 46.0 %   MCV 99.7 78.0 - 100.0 fL   MCH 32.4 26.0 - 34.0 pg   MCHC 32.5 30.0 - 36.0 g/dL   RDW 12.9 11.5 - 15.5 %   Platelets 105 (L) 150 - 400 K/uL    Comment: SPECIMEN CHECKED FOR CLOTS PLATELET COUNT CONFIRMED BY SMEAR    Neutrophils Relative % 88 %   Lymphocytes Relative 8 %   Monocytes Relative 4 %   Eosinophils Relative 0 %   Basophils Relative 0 %   Neutro Abs 5.0 1.7 - 7.7 K/uL   Lymphs Abs  0.5 (L) 0.7 - 4.0 K/uL   Monocytes Absolute 0.2 0.1 - 1.0 K/uL   Eosinophils Absolute 0.0 0.0 - 0.7 K/uL   Basophils Absolute 0.0 0.0 - 0.1 K/uL   WBC Morphology MILD LEFT SHIFT (1-5% METAS, OCC MYELO, OCC BANDS)     Comment: Performed at [facility], [address], [place], [place] [address]  Comprehensive metabolic panel     Status: Abnormal   Collection Time: 05/01/18  5:41 AM  Result Value Ref Range   Sodium 133 (L) 135 - 145 mmol/L   Potassium 4.1 3.5 - 5.1 mmol/L   Chloride 101 101 - 111 mmol/L   CO2 25 22 - 32 mmol/L   Glucose, Bld 194 (H) 65 - 99 mg/dL   BUN 26 (H) 6 - 20 mg/dL   Creatinine, Ser 0.89 0.44 - 1.00 mg/dL   Calcium 8.6 (L) 8.9 - 10.3 mg/dL   Total Protein 5.6 (L) 6.5 - 8.1 g/dL   Albumin 2.4 (L) 3.5 - 5.0 g/dL  AST 52 (H) 15 - 41 U/L   ALT 27 14 - 54 U/L   Alkaline Phosphatase 89 38 - 126 U/L   Total Bilirubin 0.7 0.3 - 1.2 mg/dL   GFR calc non Af Amer 55 (L) >60 mL/min   GFR calc Af Amer >60 >60 mL/min    Comment: (NOTE) The eGFR has been calculated using the CKD EPI equation. This calculation has not been validated in all clinical situations. eGFR's persistently <60 mL/min signify possible Chronic Kidney Disease.    Anion gap 7 5 - 15    Comment: Performed at [facility], [address], [place], [place] [address]  Lactic acid, plasma     Status: Abnormal   Collection Time: 05/01/18  5:41 AM  Result Value Ref Range   Lactic Acid, Venous 3.4 (HH) 0.5 - 1.9 mmol/L    Comment: CRITICAL RESULT CALLED TO, READ BACK BY AND VERIFIED WITH: WAGONER,R AT 6:20AM ON 05/01/18 BY [facility] Performed at [facility], [address], [place], [place] [address]   Hemoglobin A1c     Status: Abnormal   Collection Time: 05/01/18  5:41 AM  Result Value Ref Range   Hgb A1c MFr Bld 6.1 (H) 4.8 - 5.6 %    Comment: (NOTE) Pre diabetes:          5.7%-6.4% Diabetes:              >6.4% Glycemic control for   <7.0% adults with diabetes    Mean Plasma Glucose 128.37  mg/dL    Comment: Performed at [facility] [address] [place], [place] [address]  Glucose, capillary     Status: Abnormal   Collection Time: 05/01/18  5:58 AM  Result Value Ref Range   Glucose-Capillary 196 (H) 65 - 99 mg/dL  Lactic acid, plasma     Status: Abnormal   Collection Time: 05/01/18  7:52 AM  Result Value Ref Range   Lactic Acid, Venous 2.5 (HH) 0.5 - 1.9 mmol/L    Comment: CRITICAL RESULT CALLED TO, READ BACK BY AND VERIFIED WITH: JESSIE MOTLEY 05/01/18 @ 0844 [id number] Performed at [facility], [address], [place], [place] [address]   Glucose, capillary     Status: Abnormal   Collection Time: 05/01/18 11:19 AM  Result Value Ref Range   Glucose-Capillary 141 (H) 65 - 99 mg/dL  Glucose, capillary     Status: None   Collection Time: 05/01/18  5:53 PM  Result Value Ref Range   Glucose-Capillary 93 65 - 99 mg/dL   Comment 1 Notify RN    Comment 2 Document in Chart   Glucose, capillary     Status: Abnormal   Collection Time: 05/01/18 11:39 PM  Result Value Ref Range   Glucose-Capillary 136 (H) 65 - 99 mg/dL  CBC with Differential     Status: Abnormal   Collection Time: 05/02/18  4:21 AM  Result Value Ref Range   WBC 10.7 (H) 4.0 - 10.5 K/uL   RBC 3.67 (L) 3.87 - 5.11 MIL/uL   Hemoglobin 11.4 (L) 12.0 - 15.0 g/dL   HCT 36.6 36.0 - 46.0 %   MCV 99.7 78.0 - 100.0 fL   MCH 31.1 26.0 - 34.0 pg   MCHC 31.1 30.0 - 36.0 g/dL   RDW 13.2 11.5 - 15.5 %   Platelets 135 (L) 150 - 400 K/uL   Neutrophils Relative % 88 %   Neutro Abs 9.4 (H) 1.7 - 7.7 K/uL   Lymphocytes  Relative 6 %   Lymphs Abs 0.6 (L) 0.7 - 4.0 K/uL   Monocytes Relative 6 %   Monocytes Absolute 0.6 0.1 - 1.0 K/uL   Eosinophils Relative 0 %   Eosinophils Absolute 0.0 0.0 - 0.7 K/uL   Basophils Relative 0 %   Basophils Absolute 0.0 0.0 - 0.1 K/uL    Comment: Performed at [facility], [address] [place], [place] [address]  Basic metabolic panel     Status: Abnormal   Collection Time:  05/02/18  4:21 AM  Result Value Ref Range   Sodium 136 135 - 145 mmol/L   Potassium 3.7 3.5 - 5.1 mmol/L   Chloride 106 101 - 111 mmol/L   CO2 25 22 - 32 mmol/L   Glucose, Bld 114 (H) 65 - 99 mg/dL   BUN 25 (H) 6 - 20 mg/dL   Creatinine, Ser 0.68 0.44 - 1.00 mg/dL   Calcium 8.4 (L) 8.9 - 10.3 mg/dL   GFR calc non Af Amer >60 >60 mL/min   GFR calc Af Amer >60 >60 mL/min    Comment: (NOTE) The eGFR has been calculated using the CKD EPI equation. This calculation has not been validated in all clinical situations. eGFR's persistently <60 mL/min signify possible Chronic Kidney Disease.    Anion gap 5 5 - 15    Comment: Performed at [facility], [address], [place], [place] [address]  Glucose, capillary     Status: Abnormal   Collection Time: 05/02/18  6:11 AM  Result Value Ref Range   Glucose-Capillary 106 (H) 65 - 99 mg/dL    ABGS No results for input(s): PHART, PO2ART, TCO2, HCO3 in the last 72 hours.  Invalid input(s): PCO2 CULTURES Recent Results (from the past 240 hour(s))  Blood Culture (routine x 2)     Status: None (Preliminary result)   Collection Time: 05/11/2018  8:09 PM  Result Value Ref Range Status   Specimen Description   Final    BLOOD LEFT HAND Performed at [facility], [address] [place], [place] [address]    Special Requests   Final    BOTTLES DRAWN AEROBIC AND ANAEROBIC Blood Culture results may not be optimal due to an inadequate volume of blood received in culture bottles Performed at [facility], [address], [place], [place] [address]    Culture  Setup Time   Final    GRAM POSITIVE COCCI Gram Stain Report Called to,Read Back By and Verified With: MOTLEY,J. AT 6301 ON 05/01/2018 BY EVA IN BOTH AEROBIC AND ANAEROBIC BOTTLES Performed at [place] ID to follow CRITICAL RESULT CALLED TO, READ BACK BY AND VERIFIED WITHLynwood Dawley RN 6010 05/01/18 A BROWNING Performed at [facility], [place] [address] [place], [place]  [address]    Culture   Final    NO GROWTH 2 DAYS Performed at [facility], [address], [place], [place] [address]    Report Status PENDING  Incomplete  Blood Culture (routine x 2)     Status: None (Preliminary result)   Collection Time: 04/14/2018  9:20 PM  Result Value Ref Range Status   Specimen Description BLOOD LEFT ARM  Final   Special Requests   Final    BOTTLES DRAWN AEROBIC AND ANAEROBIC Blood Culture adequate volume   Culture   Final    NO GROWTH 2 DAYS Performed at [facility], [address] [place], [place] [address]    Report Status PENDING  Incomplete  MRSA PCR Screening     Status: None   Collection Time: 05/06/2018 11:40 PM  Result Value Ref Range Status   MRSA by PCR NEGATIVE NEGATIVE Final    Comment:        The GeneXpert MRSA Assay (FDA approved for NASAL specimens only), is one component of a comprehensive MRSA colonization surveillance program. It is not intended to diagnose MRSA infection nor to guide or monitor treatment for MRSA infections. Performed at [facility], [address] [place], [place] [address]    Studies/Results: Ct Angio Chest Pe W Or Wo Contrast  Result Date: 05/01/2018 CLINICAL DATA:  84 y/o  F; shortness of breath and hypoxia. EXAM: CT ANGIOGRAPHY CHEST WITH CONTRAST TECHNIQUE: Multidetector CT imaging of the chest was performed using the standard protocol during bolus administration of intravenous contrast. Multiplanar CT image reconstructions and MIPs were obtained to evaluate the vascular anatomy. CONTRAST:  147m ISOVUE-370 IOPAMIDOL (ISOVUE-370) INJECTION 76% COMPARISON:  04/17/2018 chest radiograph FINDINGS: Cardiovascular: Satisfactory opacification of the pulmonary arteries to the segmental level. No evidence of pulmonary embolism. Normal heart size. No pericardial effusion. Calcific atherosclerosis of the aorta. Mediastinum/Nodes: Mild lower paratracheal, hilar, and AP window lymphadenopathy, likely reactive. Severe dilatation of  the upper esophagus mild dilatation of the lower esophagus with fluid level. Normal thyroid gland. Lungs/Pleura: Small areas of consolidation within the bilateral upper lobes, left lower lobe lobar consolidation, and multi segmental consolidation of the right lower lobe with air bronchograms compatible with pneumonia. No pleural effusion or pneumothorax. Upper Abdomen: No acute abnormality. Musculoskeletal: No chest wall abnormality. No acute or significant osseous findings. Review of the MIP images confirms the above findings. IMPRESSION: 1. No pulmonary embolus identified. 2. Consolidation throughout the dependent left-greater-than-right lungs compatible with pneumonia. 3. Aortic atherosclerosis. 4. Marked esophageal dilatation, question lower esophageal or gastroesophageal junction obstruction. Electronically Signed   By: LKristine GarbeM.D.   On: 05/01/2018 00:33   Dg Chest Port 1 View  Result Date: 05/12/2018 CLINICAL DATA:  Shortness of breath. EXAM: PORTABLE CHEST 1 VIEW COMPARISON:  PA and lateral chest 07/13/2015. FINDINGS: There are small bilateral pleural effusions and basilar airspace disease, worse on the left. No pneumothorax. Heart size is normal. Aortic atherosclerosis is noted. Marked dilatation of the esophagus as seen on prior exam is unchanged. IMPRESSION: Left greater than right small effusions and basilar airspace disease, likely atelectasis. Marked dilatation of the esophagus is chronic and likely due to achalasia. Electronically Signed   By: TInge RiseM.D.   On: 05/11/2018 21:16    Medications:  Prior to Admission:  Medications Prior to Admission  Medication Sig Dispense Refill Last Dose  . ALPRAZolam (XANAX) 0.5 MG tablet Take 1 tablet (0.5 mg total) by mouth 3 (three) times daily as needed for anxiety.  0 04/26/2018 at Unknown time  . calcium carbonate, dosed in mg elemental calcium, 1250 (500 CA) MG/5ML Place 5 mLs (500 mg of elemental calcium total) into  feeding tube 2 (two) times daily with a meal. 450 mL  Past Week at Unknown time  . gabapentin (NEURONTIN) 300 MG capsule Take 1 capsule by mouth 3 (three) times daily.  11 05/11/2018 at Unknown time  . magnesium citrate SOLN 1 cc twice daily   Past Week at Unknown time  . meclizine (ANTIVERT) 25 MG tablet Take 25 mg by mouth at bedtime.   05/02/2018 at Unknown time  . Nutritional Supplements (FEEDING SUPPLEMENT, JEVITY 1.2 CAL,) LIQD Place 1,000 mLs into feeding tube daily.  0 04/19/2018 at Unknown time  . nystatin cream (MYCOSTATIN) APPLY TO AFFECTED AREAS TWICE DAILY. USE WITH TRIAMCINOLONE CREAM. 15 g 0 Past Week at Unknown time  . Potassium Chloride 40 MEQ/15ML (20%) SOLN Take 15 mLs by mouth 2 (two) times daily.   Past Week at Unknown time  . ranitidine (ZANTAC) 15 MG/ML syrup Take 150 mg by mouth 2 (two) times daily.    05/11/2018 at Unknown time  . silver nitrate applicators 82-50 % applicator Apply topically daily. Apply silver nitrate solution to granulation tissue 3 times a week as directed until it is completely resolved. 100 each 0 Past Week at Unknown time  . triamcinolone cream (KENALOG) 0.1 % APPLY TO AFFECTED AREAS TWICE DAILY. USE WITH NYSTATIN CREAM. 15 g 0 Past Week at Unknown time   Scheduled: . chlorhexidine  15 mL Mouth Rinse BID  . Chlorhexidine Gluconate Cloth  6 each Topical Daily  . enoxaparin (LOVENOX) injection  40 mg Subcutaneous Q24H  . gabapentin  300 mg Oral Once  . ipratropium-albuterol  3 mL Nebulization Q6H  . mouth rinse  15 mL Mouth Rinse q12n4p  . sodium chloride flush  10-40 mL Intracatheter Q12H   Continuous: . sodium chloride 75 mL/hr at 05/01/18 2314  . piperacillin-tazobactam (ZOSYN)  IV 3.375 g (05/02/18 0500)  . vancomycin Stopped (05/01/18 2301)   NLZ:[id number] **OR** acetaminophen, albuterol, ondansetron **OR** ondansetron (ZOFRAN) IV, sodium chloride flush  Assesment: She was admitted with sepsis presumably secondary to MRSA.  She has  pneumonia likely from aspiration considering her dysphagia.  She is improving.  She had developed atrial fib but that converted with amiodarone and she remains in sinus rhythm now.  She is still somewhat hypotensive.  She is oxygenating better.  Palliative care consultation has been obtained and although we think she is going to survive this and get better she has elected DNR status which I think is appropriate  She has hyperglycemia and if she can start eating I will change her Accu-Cheks to before meals and at bedtime with sliding scale coverage but I am not going to give her any insulin coverage yet until she can eat.  She has severe dysphagia which is a long-standing problem and she has a feeding tube to help keep her nutritional status adequate.  She does have malnutrition of moderate degree.  She has elevated AST probably from sepsis  Echocardiogram shows grade 2 diastolic dysfunction. Principal Problem:   Sepsis due to undetermined organism [facility]) Active Problems:   Dysphagia, pharyngoesophageal phase   Malnutrition of moderate degree (HCC)   Lactic acidosis   Hyponatremia   Elevated AST (SGOT)   Acute respiratory failure (HCC)   Sinus tachycardia   Hyperglycemia   Arterial hypotension   Palliative care by specialist   Goals of care, counseling/discussion   DNR (do not resuscitate) discussion    Plan: Continue treatments.  I think she still needs fluids.  Her blood pressure is not very high at baseline.  Continue IV antibiotics.  Speech evaluation.  Her swallowing is not very good at baseline.    LOS: 2 days   , L 05/02/2018, 8:25 AM

## 2018-05-02 NOTE — Progress Notes (Signed)
Pt complaining that her Xanax and Gabapentin are not ordered-states she received them last night. RN adv medication was ordered as a one time dose and pt needs to speak with Dr. Juanetta GoslingHawkins about ordering medication. Dr. Rana SnareBodenheimer paged to see if medication could be ordered for tonight. Waiting for call back/orders. Will continue to monitor pt

## 2018-05-02 NOTE — Progress Notes (Signed)
Dr. Rana SnareBodenheimer paged again d/t pts HR increasing to 130's, RR rate 29, and pt c/o being anxious and wanting her xanax and Gabapentin. Pt wants this RN to call Dr. Juanetta GoslingHawkins, explained to pt that I have to call the on-call physician to get any medications ordered and she needs to talk to Dr. Juanetta GoslingHawkins in the am.  Waiting for orders/call back. Will continue to monitor pt

## 2018-05-02 NOTE — Progress Notes (Signed)
Pts monitor screen placed on comfort per Palliative orders. Will continue to monitor pt

## 2018-05-02 NOTE — Progress Notes (Signed)
BG: 68, pt alert and oriented not symptomatic. PEG tube feeding is to start today. Pt does not take anything by mouth. No actions taken.

## 2018-05-02 NOTE — Progress Notes (Signed)
SLP Cancellation Note  Patient Details Name: Nancy RakeDorothy P Horn MRN: [id number] DOB: 11-14-1924   Cancelled treatment:       Reason Eval/Treat Not Completed: SLP screened, no needs identified, will sign off; pt has been NPO since 2019 d/t esophageal dysphagia; stated "my esophagus doesn't work"; required suctioning for saliva management; pt has nutrition/hydration provided by PEG at this time.  ST will s/o at this time.   Tressie StalkerPat Jizel Horn, M.S., CCC-SLP 05/02/2018, 2:33 PM

## 2018-05-02 NOTE — Progress Notes (Signed)
Restarted patients home TF regimen, but changed to continuous from bolus. Jev 1.2 @ 50. Will provide: 1440 kcals, 67g Pro, 968 ml fluid. Would flush with 150 cc free water q6 hrs to meet fluid needs (if not on IVF support).   Christophe LouisNathan Franks RD, LDN, CNSC Clinical Nutrition Available Tues-Sat via Pager: [phone] 05/02/2018 11:16 AM

## 2018-05-03 DIAGNOSIS — E440 Moderate protein-calorie malnutrition: Secondary | ICD-10-CM

## 2018-05-03 LAB — CBC WITH DIFFERENTIAL/PLATELET
Basophils Absolute: 0 10*3/uL (ref 0.0–0.1)
Basophils Relative: 0 %
EOS PCT: 0 %
Eosinophils Absolute: 0 10*3/uL (ref 0.0–0.7)
HEMATOCRIT: 35.9 % — AB (ref 36.0–46.0)
HEMOGLOBIN: 11.4 g/dL — AB (ref 12.0–15.0)
LYMPHS ABS: 0.7 10*3/uL (ref 0.7–4.0)
LYMPHS PCT: 8 %
MCH: 31.5 pg (ref 26.0–34.0)
MCHC: 31.8 g/dL (ref 30.0–36.0)
MCV: 99.2 fL (ref 78.0–100.0)
Monocytes Absolute: 0.5 10*3/uL (ref 0.1–1.0)
Monocytes Relative: 6 %
NEUTROS ABS: 7.6 10*3/uL (ref 1.7–7.7)
NEUTROS PCT: 86 %
PLATELETS: 132 10*3/uL — AB (ref 150–400)
RBC: 3.62 MIL/uL — AB (ref 3.87–5.11)
RDW: 13.2 % (ref 11.5–15.5)
WBC: 8.8 10*3/uL (ref 4.0–10.5)

## 2018-05-03 LAB — BASIC METABOLIC PANEL
ANION GAP: 5 (ref 5–15)
BUN: 26 mg/dL — ABNORMAL HIGH (ref 6–20)
CHLORIDE: 104 mmol/L (ref 101–111)
CO2: 29 mmol/L (ref 22–32)
Calcium: 8.8 mg/dL — ABNORMAL LOW (ref 8.9–10.3)
Creatinine, Ser: 0.61 mg/dL (ref 0.44–1.00)
GFR calc Af Amer: >60 mL/min (ref >60)
GLUCOSE: 179 mg/dL — AB (ref 65–99)
POTASSIUM: 3.5 mmol/L (ref 3.5–5.1)
Sodium: 138 mmol/L (ref 135–145)

## 2018-05-03 LAB — GLUCOSE, CAPILLARY
GLUCOSE-CAPILLARY: 122 mg/dL — AB (ref 65–99)
GLUCOSE-CAPILLARY: 121 mg/dL — AB (ref 65–99)
Glucose-Capillary: 163 mg/dL — ABNORMAL HIGH (ref 65–99)
Glucose-Capillary: 112 mg/dL — ABNORMAL HIGH (ref 65–99)

## 2018-05-03 MED ORDER — ALPRAZOLAM 0.5 MG PO TABS
0.5000 mg | ORAL_TABLET | Freq: Three times a day (TID) | ORAL | Status: DC | PRN
  Administered 2018-05-03 – 2018-05-04 (×3): 0.5 mg via ORAL
  Filled 2018-05-03 (×3): qty 1

## 2018-05-03 MED ORDER — MECLIZINE HCL 12.5 MG PO TABS
25.0000 mg | ORAL_TABLET | Freq: Every day | ORAL | Status: DC
  Administered 2018-05-03 – 2018-05-04 (×2): 25 mg via ORAL
  Filled 2018-05-03 (×2): qty 2

## 2018-05-03 MED ORDER — BISACODYL 10 MG RE SUPP: 10.0000 mg | Freq: Every day | RECTAL | Status: DC | PRN

## 2018-05-03 MED ORDER — POTASSIUM CHLORIDE 20 MEQ/15ML (10%) PO SOLN
40.0000 meq | Freq: Two times a day (BID) | ORAL | Status: DC
  Administered 2018-05-03 – 2018-05-05 (×5): 40 meq via ORAL
  Filled 2018-05-03 (×5): qty 30

## 2018-05-03 MED ORDER — JEVITY 1.2 CAL PO LIQD
1000.0000 mL | ORAL | Status: DC
  Administered 2018-05-03 – 2018-05-06 (×3): 1000 mL
  Filled 2018-05-03 (×6): qty 1000

## 2018-05-03 MED ORDER — PRO-STAT SUGAR FREE PO LIQD
30.0000 mL | Freq: Every day | ORAL | Status: DC
  Administered 2018-05-03 – 2018-05-05 (×3): 30 mL
  Filled 2018-05-03 (×3): qty 30

## 2018-05-03 MED ORDER — GUAIFENESIN 100 MG/5ML PO SOLN
10.0000 mL | Freq: Four times a day (QID) | ORAL | Status: DC
  Administered 2018-05-03 – 2018-05-06 (×12): 200 mg
  Filled 2018-05-03 (×10): qty 10
  Filled 2018-05-03: qty 5
  Filled 2018-05-03: qty 10

## 2018-05-03 MED ORDER — NYSTATIN-TRIAMCINOLONE 100000-0.1 UNIT/GM-% EX CREA
TOPICAL_CREAM | Freq: Two times a day (BID) | CUTANEOUS | Status: DC
  Administered 2018-05-03 – 2018-05-04 (×3): via TOPICAL
  Administered 2018-05-05: 1 via TOPICAL
  Administered 2018-05-05: 21:00:00 via TOPICAL
  Filled 2018-05-03: qty 15

## 2018-05-03 MED ORDER — GABAPENTIN 300 MG PO CAPS
300.0000 mg | ORAL_CAPSULE | Freq: Three times a day (TID) | ORAL | Status: DC
  Administered 2018-05-03 – 2018-05-05 (×7): 300 mg via ORAL
  Filled 2018-05-03 (×7): qty 1

## 2018-05-03 MED ORDER — SENNOSIDES-DOCUSATE SODIUM 8.6-50 MG PO TABS
1.0000 | ORAL_TABLET | Freq: Every day | ORAL | Status: DC
  Administered 2018-05-03 – 2018-05-04 (×2): 1 via ORAL
  Filled 2018-05-03 (×2): qty 1

## 2018-05-03 MED ORDER — FAMOTIDINE 40 MG/5ML PO SUSR
20.0000 mg | Freq: Every day | ORAL | Status: DC
  Administered 2018-05-03 – 2018-05-05 (×3): 20 mg
  Filled 2018-05-03 (×6): qty 2.5

## 2018-05-03 MED ORDER — FAMOTIDINE 40 MG/5ML PO SUSR
20.0000 mg | Freq: Two times a day (BID) | ORAL | Status: DC
  Filled 2018-05-03: qty 2.5

## 2018-05-03 MED ORDER — POTASSIUM CHLORIDE 40 MEQ/15ML (20%) PO SOLN: 15.0000 mL | Freq: Two times a day (BID) | ORAL | Status: DC

## 2018-05-03 NOTE — Progress Notes (Signed)
Initial Nutrition Assessment  DOCUMENTATION CODES:  Non-severe (moderate) malnutrition in context of chronic illness  INTERVENTION:  Increase Jev 1.2 to 1955ml/hr (1320ml per day) and add Prostat 30 ml BID to provide 1684 kcals, 88 gm protein, 1065 ml free water daily.  If not on IVF support, would need ~200 cc free water q 6 hrs to meet estimated fluid needs  NUTRITION DIAGNOSIS:  Moderate Malnutrition related to chronic illness, dysphagia(Past hx of dyphagia/requirement of PEG) as evidenced by moderate muscle/fat loss.  GOAL:  Patient will meet greater than or equal to 90% of their needs  MONITOR:  PO intake  REASON FOR ASSESSMENT:  New TF    ASSESSMENT:  84 y/o female PMHx Dysphagia, Esophageal motility disorder s/p PEG, chronic NPO. Presents w/ SOB, Fever, fatigue, malaise x2 days. Was found to be septic and in acute respiratory failure. Admitted to stepdown unit for management.   Patient seen due to being home tube feeder. Patient reports she has had her PEG since 2016. She says she does not taking anything by mouth and is 100% reliant on TF for nutrition/hydration. Her home regimen as 5 cans of Jevity 1.2/day. This regimen provides: 1425 kcals, 66g Pro and 955 cc fluid. Unable to obtain information regarding flush amount.   She denies having any tolerance issues on this regimen. Also denies experiencing any weight changes on regimen. She says her UBW is 120 lbs. Per chart, she was 107-112 lbs in 2016 when she reports tube being placed. She was admitted at 126.3 lbs, indicating her TF regimen is more than sufficient at baseline. Weight gain would have been desirable given her low BMI in 2016.   At this time, patient denies any n/v/d. She says she has not had a BM since Tuesday. Palliative NP at bedside notes she will see if nursing can make sure pt is not impacted/constipated.   Information slightly hard to obtain due to limited ability to discern speech (gurgling/wt vocal quality)  and her focus on other concerns.  NFPE: mild-moderate muscle/fat wasting-See below. Abdomen is mildly firm. No notable edema.   Jev 1.2 infusing at 50. While her home TF regimen is adequate when patient is at baseline, she requires additional pro/kcal given septic state. Will make adjustments to regimen.  Labs: Glu: Past 3-160-180, WBC:10.7 Meds: Senna, KCl,   Recent Labs  Lab 05/01/18 0541 05/02/18 0421 05/03/18 0545  NA 133* 136 138  K 4.1 3.7 3.5  CL 101 106 104  CO2 25 25 29   BUN 26* 25* 26*  CREATININE 0.89 0.68 0.61  CALCIUM 8.6* 8.4* 8.8*  GLUCOSE 194* 114* 179*    NUTRITION - FOCUSED PHYSICAL EXAM:   Most Recent Value  Orbital Region  Moderate depletion  Upper Arm Region  Mild depletion  Thoracic and Lumbar Region  Mild depletion  Buccal Region  No depletion  Temple Region  Moderate depletion  Clavicle Bone Region  Mild depletion  Clavicle and Acromion Bone Region  Mild depletion  Scapular Bone Region  Unable to assess  Dorsal Hand  Mild depletion  Patellar Region  No depletion  Anterior Thigh Region  Moderate depletion  Posterior Calf Region  Moderate depletion  Edema (RD Assessment)  None  Hair  Reviewed  Eyes  Reviewed  Mouth  Reviewed  Skin  Reviewed  Nails  Reviewed     Diet Order:   Diet Order           Diet NPO time specified  Diet effective now  EDUCATION NEEDS:  No education needs have been identified at this time  Skin: Excoriated Abdomen, back, legs, MASD to perineum, Skin tear to back  Last BM:  6/18  Height:  Ht Readings from Last 1 Encounters:  05/01/18 5\' 5"  (1.651 m)   Weight:  Wt Readings from Last 1 Encounters:  05/03/18 138 lb 14.2 oz (63 kg)   Wt Readings from Last 10 Encounters:  05/03/18 138 lb 14.2 oz (63 kg)  05/23/17 120 lb (54.4 kg)  04/05/16 114 lb (51.7 kg)  04/04/16 114 lb (51.7 kg)  03/27/16 114 lb 9.6 oz (52 kg)  09/22/15 109 lb 6.4 oz (49.6 kg)  08/10/15 106 lb 12.8 oz (48.4 kg)  07/13/15 107 lb  5.8 oz (48.7 kg)  06/29/15 115 lb 11.2 oz (52.5 kg)  06/24/15 109 lb (49.4 kg)   Ideal Body Weight:  56.8 kg  BMI:  Body mass index is 23.11 kg/m.  Dry weight: 126.3 lbs (57.4 kg) Estimated Nutritional Needs:  Kcal:  1650-1800kcals (29-31 kcal/kg bw) Protein:  80-92g Pro (1.4-1.6g/kg bw) Fluid:  1.6-1.8 L fluid  Christophe Louis RD, LDN, CNSC Clinical Nutrition Available Tues-Sat via Pager: [phone] 05/03/2018 11:32 AM

## 2018-05-03 NOTE — Care Management Important Message (Signed)
Important Message  Patient Details  Name: Keith RakeDorothy P Campa MRN: [id number] Date of Birth: [birth year]   Medicare Important Message Given:  Yes    Renie OraHawkins, Romell Wolden Smith 05/03/2018, 12:16 PM

## 2018-05-03 NOTE — Progress Notes (Signed)
Pt is unable to perform flutter. CPT done via the bed

## 2018-05-03 NOTE — Progress Notes (Signed)
Subjective: She says she feels better.  She is having some anxiety.  She wants to have her home medications restarted and I think it safe to do that now.  Her blood pressure is better.  She is still coughing and still very congested.  Objective: Vital signs in last 24 hours: Temp:  [97.9 F (36.6 C)-99.9 F (37.7 C)] 98.4 F (36.9 C) (06/21 0600) Pulse Rate:  [79-134] 83 (06/21 0600) Resp:  [17-30] 18 (06/21 0600) BP: (92-142)/(45-71) 117/68 (06/21 0600) SpO2:  [80 %-100 %] 91 % (06/21 0600) Weight:  [63 kg (138 lb 14.2 oz)] 63 kg (138 lb 14.2 oz) (06/21 0500) Weight change: 1.4 kg (3 lb 1.4 oz) Last BM Date: 04/13/2018  Intake/Output from previous day: 06/20 0701 - 06/21 0700 In: 2016.1 [I.V.:1510; NG/GT:106.7; IV Piggyback:299.4] Out: 600 [Urine:600]  PHYSICAL EXAM General appearance: alert, cooperative and mild distress Resp: rhonchi bilaterally Cardio: regular rate and rhythm, S1, S2 normal, no murmur, click, rub or gallop GI: soft, non-tender; bowel sounds normal; no masses,  no organomegaly Extremities: extremities normal, atraumatic, no cyanosis or edema  Lab Results:  Results for orders placed or performed during the hospital encounter of 04/29/2018 (from the past 48 hour(s))  Glucose, capillary     Status: Abnormal   Collection Time: 05/01/18 11:19 AM  Result Value Ref Range   Glucose-Capillary 141 (H) 65 - 99 mg/dL  Glucose, capillary     Status: None   Collection Time: 05/01/18  5:53 PM  Result Value Ref Range   Glucose-Capillary 93 65 - 99 mg/dL   Comment 1 Notify RN    Comment 2 Document in Chart   Glucose, capillary     Status: Abnormal   Collection Time: 05/01/18 11:39 PM  Result Value Ref Range   Glucose-Capillary 136 (H) 65 - 99 mg/dL  CBC with Differential     Status: Abnormal   Collection Time: 05/02/18  4:21 AM  Result Value Ref Range   WBC 10.7 (H) 4.0 - 10.5 K/uL   RBC 3.67 (L) 3.87 - 5.11 MIL/uL   Hemoglobin 11.4 (L) 12.0 - 15.0 g/dL   HCT 36.6  36.0 - 46.0 %   MCV 99.7 78.0 - 100.0 fL   MCH 31.1 26.0 - 34.0 pg   MCHC 31.1 30.0 - 36.0 g/dL   RDW 13.2 11.5 - 15.5 %   Platelets 135 (L) 150 - 400 K/uL   Neutrophils Relative % 88 %   Neutro Abs 9.4 (H) 1.7 - 7.7 K/uL   Lymphocytes Relative 6 %   Lymphs Abs 0.6 (L) 0.7 - 4.0 K/uL   Monocytes Relative 6 %   Monocytes Absolute 0.6 0.1 - 1.0 K/uL   Eosinophils Relative 0 %   Eosinophils Absolute 0.0 0.0 - 0.7 K/uL   Basophils Relative 0 %   Basophils Absolute 0.0 0.0 - 0.1 K/uL    Comment: Performed at [facility], [address] [place],  [address]  Basic metabolic panel     Status: Abnormal   Collection Time: 05/02/18  4:21 AM  Result Value Ref Range   Sodium 136 135 - 145 mmol/L   Potassium 3.7 3.5 - 5.1 mmol/L   Chloride 106 101 - 111 mmol/L   CO2 25 22 - 32 mmol/L   Glucose, Bld 114 (H) 65 - 99 mg/dL   BUN 25 (H) 6 - 20 mg/dL   Creatinine, Ser 0.68 0.44 - 1.00 mg/dL   Calcium 8.4 (L) 8.9 - 10.3 mg/dL  GFR calc non Af Amer >60 >60 mL/min   GFR calc Af Amer >60 >60 mL/min    Comment: (NOTE) The eGFR has been calculated using the CKD EPI equation. This calculation has not been validated in all clinical situations. eGFR's persistently <60 mL/min signify possible Chronic Kidney Disease.    Anion gap 5 5 - 15    Comment: Performed at [facility], [address] [place], [place] [address]  Glucose, capillary     Status: Abnormal   Collection Time: 05/02/18  6:11 AM  Result Value Ref Range   Glucose-Capillary 106 (H) 65 - 99 mg/dL  Glucose, capillary     Status: None   Collection Time: 05/02/18 11:35 AM  Result Value Ref Range   Glucose-Capillary 68 65 - 99 mg/dL  Glucose, capillary     Status: Abnormal   Collection Time: 05/02/18  4:34 PM  Result Value Ref Range   Glucose-Capillary 108 (H) 65 - 99 mg/dL  Glucose, capillary     Status: Abnormal   Collection Time: 05/02/18  9:33 PM  Result Value Ref Range   Glucose-Capillary 182 (H) 65 - 99 mg/dL  CBC with  Differential     Status: Abnormal   Collection Time: 05/03/18  5:45 AM  Result Value Ref Range   WBC 8.8 4.0 - 10.5 K/uL   RBC 3.62 (L) 3.87 - 5.11 MIL/uL   Hemoglobin 11.4 (L) 12.0 - 15.0 g/dL   HCT 35.9 (L) 36.0 - 46.0 %   MCV 99.2 78.0 - 100.0 fL   MCH 31.5 26.0 - 34.0 pg   MCHC 31.8 30.0 - 36.0 g/dL   RDW 13.2 11.5 - 15.5 %   Platelets 132 (L) 150 - 400 K/uL   Neutrophils Relative % 86 %   Neutro Abs 7.6 1.7 - 7.7 K/uL   Lymphocytes Relative 8 %   Lymphs Abs 0.7 0.7 - 4.0 K/uL   Monocytes Relative 6 %   Monocytes Absolute 0.5 0.1 - 1.0 K/uL   Eosinophils Relative 0 %   Eosinophils Absolute 0.0 0.0 - 0.7 K/uL   Basophils Relative 0 %   Basophils Absolute 0.0 0.0 - 0.1 K/uL    Comment: Performed at [facility], [address], [place], [place] [address]  Basic metabolic panel     Status: Abnormal   Collection Time: 05/03/18  5:45 AM  Result Value Ref Range   Sodium 138 135 - 145 mmol/L   Potassium 3.5 3.5 - 5.1 mmol/L   Chloride 104 101 - 111 mmol/L   CO2 29 22 - 32 mmol/L   Glucose, Bld 179 (H) 65 - 99 mg/dL   BUN 26 (H) 6 - 20 mg/dL   Creatinine, Ser 0.61 0.44 - 1.00 mg/dL   Calcium 8.8 (L) 8.9 - 10.3 mg/dL   GFR calc non Af Amer >60 >60 mL/min   GFR calc Af Amer >60 >60 mL/min    Comment: (NOTE) The eGFR has been calculated using the CKD EPI equation. This calculation has not been validated in all clinical situations. eGFR's persistently <60 mL/min signify possible Chronic Kidney Disease.    Anion gap 5 5 - 15    Comment: Performed at [facility], [address] [place], [place] [address]    ABGS No results for input(s): PHART, PO2ART, TCO2, HCO3 in the last 72 hours.  Invalid input(s): PCO2 CULTURES Recent Results (from the past 240 hour(s))  Urine culture     Status: None   Collection Time: 04/27/2018  8:05 PM  Result Value Ref Range Status   Specimen Description   Final    URINE, CATHETERIZED Performed at [facility], [address] [place],  [place] [address]    Special Requests   Final    Normal Performed at [facility], [address], [place], [place] [address]    Culture   Final    NO GROWTH Performed at [facility], [place] [address] [place], [place] [address]    Report Status 05/02/2018 FINAL  Final  Blood Culture (routine x 2)     Status: Abnormal (Preliminary result)   Collection Time: 04/25/2018  8:09 PM  Result Value Ref Range Status   Specimen Description   Final    BLOOD LEFT HAND Performed at [facility], [address] [place] [address] [place], [place] [address]    Special Requests   Final    BOTTLES DRAWN AEROBIC AND ANAEROBIC Blood Culture results may not be optimal due to an inadequate volume of blood received in culture bottles Performed at [facility], [address], [place], [place] [address]    Culture  Setup Time   Final    GRAM POSITIVE COCCI Gram Stain Report Called to,Read Back By and Verified With: MOTLEY,J. AT 4315 ON 05/01/2018 BY EVA IN BOTH AEROBIC AND ANAEROBIC BOTTLES Performed at [place], READ BACK BY AND VERIFIED WITHLynwood Dawley RN [id number] 05/01/18 A BROWNING Performed at [facility], [place] [address], [place],  [address]    Culture STAPHYLOCOCCUS SPECIES (COAGULASE NEGATIVE) (A)  Final   Report Status PENDING  Incomplete  Blood Culture ID Panel (Reflexed)     Status: Abnormal   Collection Time: 04/29/2018  8:09 PM  Result Value Ref Range Status   Enterococcus species NOT DETECTED NOT DETECTED Final   Listeria monocytogenes NOT DETECTED NOT DETECTED Final   Staphylococcus species DETECTED (A) NOT DETECTED Final    Comment: Methicillin (oxacillin) resistant coagulase negative staphylococcus. Possible blood culture contaminant (unless isolated from more than one blood culture draw or clinical case suggests pathogenicity). No antibiotic treatment is indicated for blood  culture contaminants. CRITICAL RESULT CALLED TO, READ BACK BY AND VERIFIED WITHLynwood Dawley RN  [id number] 05/01/18 A BROWNING    Staphylococcus aureus NOT DETECTED NOT DETECTED Final   Methicillin resistance DETECTED (A) NOT DETECTED Final    Comment: CRITICAL RESULT CALLED TO, READ BACK BY AND VERIFIED WITHLynwood Dawley RN [id number] 05/01/18 A BROWNING    Streptococcus species NOT DETECTED NOT DETECTED Final   Streptococcus agalactiae NOT DETECTED NOT DETECTED Final   Streptococcus pneumoniae NOT DETECTED NOT DETECTED Final   Streptococcus pyogenes NOT DETECTED NOT DETECTED Final   Acinetobacter baumannii NOT DETECTED NOT DETECTED Final   Enterobacteriaceae species NOT DETECTED NOT DETECTED Final   Enterobacter cloacae complex NOT DETECTED NOT DETECTED Final   Escherichia coli NOT DETECTED NOT DETECTED Final   Klebsiella oxytoca NOT DETECTED NOT DETECTED Final   Klebsiella pneumoniae NOT DETECTED NOT DETECTED Final   Proteus species NOT DETECTED NOT DETECTED Final   Serratia marcescens NOT DETECTED NOT DETECTED Final   Haemophilus influenzae NOT DETECTED NOT DETECTED Final   Neisseria meningitidis NOT DETECTED NOT DETECTED Final   Pseudomonas aeruginosa NOT DETECTED NOT DETECTED Final   Candida albicans NOT DETECTED NOT DETECTED Final   Candida glabrata NOT DETECTED NOT DETECTED Final   Candida krusei NOT DETECTED NOT DETECTED Final   Candida parapsilosis NOT DETECTED NOT DETECTED Final  Candida tropicalis NOT DETECTED NOT DETECTED Final    Comment: Performed at [facility], [place] [address], [place], [place] [address]  Blood Culture (routine x 2)     Status: None (Preliminary result)   Collection Time: 04/29/2018  9:20 PM  Result Value Ref Range Status   Specimen Description BLOOD LEFT ARM  Final   Special Requests   Final    BOTTLES DRAWN AEROBIC AND ANAEROBIC Blood Culture adequate volume   Culture   Final    NO GROWTH 3 DAYS Performed at [facility], [address], [place], [place] [address]    Report Status PENDING  Incomplete  MRSA PCR Screening     Status: None    Collection Time: 04/29/2018 11:40 PM  Result Value Ref Range Status   MRSA by PCR NEGATIVE NEGATIVE Final    Comment:        The GeneXpert MRSA Assay (FDA approved for NASAL specimens only), is one component of a comprehensive MRSA colonization surveillance program. It is not intended to diagnose MRSA infection nor to guide or monitor treatment for MRSA infections. Performed at [facility], [address] [place] [address] [place], [place] [address]    Studies/Results: No results found.  Medications:  Prior to Admission:  Medications Prior to Admission  Medication Sig Dispense Refill Last Dose  . ALPRAZolam (XANAX) 0.5 MG tablet Take 1 tablet (0.5 mg total) by mouth 3 (three) times daily as needed for anxiety.  0 04/17/2018 at Unknown time  . calcium carbonate, dosed in mg elemental calcium, 1250 (500 CA) MG/5ML Place 5 mLs (500 mg of elemental calcium total) into feeding tube 2 (two) times daily with a meal. 450 mL  Past Week at Unknown time  . gabapentin (NEURONTIN) 300 MG capsule Take 1 capsule by mouth 3 (three) times daily.  11 04/29/2018 at Unknown time  . magnesium citrate SOLN 1 cc twice daily   Past Week at Unknown time  . meclizine (ANTIVERT) 25 MG tablet Take 25 mg by mouth at bedtime.   04/17/2018 at Unknown time  . Nutritional Supplements (FEEDING SUPPLEMENT, JEVITY 1.2 CAL,) LIQD Place 1,000 mLs into feeding tube daily.  0 05/06/2018 at Unknown time  . nystatin cream (MYCOSTATIN) APPLY TO AFFECTED AREAS TWICE DAILY. USE WITH TRIAMCINOLONE CREAM. 15 g 0 Past Week at Unknown time  . Potassium Chloride 40 MEQ/15ML (20%) SOLN Take 15 mLs by mouth 2 (two) times daily.   Past Week at Unknown time  . ranitidine (ZANTAC) 15 MG/ML syrup Take 150 mg by mouth 2 (two) times daily.    04/21/2018 at Unknown time  . silver nitrate applicators 54-00 % applicator Apply topically daily. Apply silver nitrate solution to granulation tissue 3 times a week as directed until it is completely resolved. 100 each 0 Past  Week at Unknown time  . triamcinolone cream (KENALOG) 0.1 % APPLY TO AFFECTED AREAS TWICE DAILY. USE WITH NYSTATIN CREAM. 15 g 0 Past Week at Unknown time   Scheduled: . chlorhexidine  15 mL Mouth Rinse BID  . Chlorhexidine Gluconate Cloth  6 each Topical Daily  . Chlorhexidine Gluconate Cloth  6 each Topical Daily  . enoxaparin (LOVENOX) injection  40 mg Subcutaneous Q24H  . famotidine  20 mg Per Tube BID  . gabapentin  300 mg Oral TID  . guaiFENesin  10 mL Per Tube Q6H  . insulin aspart  0-9 Units Subcutaneous TID WC  . ipratropium-albuterol  3 mL Nebulization Q6H  . meclizine  25 mg Oral QHS  . mouth rinse  15 mL Mouth Rinse q12n4p  . potassium chloride  40 mEq Oral BID  . sodium chloride flush  10-40 mL Intracatheter Q12H  . sodium chloride flush  10-40 mL Intracatheter Q12H   Continuous: . sodium chloride 75 mL/hr at 05/03/18 0600  . feeding supplement (JEVITY 1.2 CAL) 50 mL/hr at 05/03/18 0600  . piperacillin-tazobactam (ZOSYN)  IV 3.375 g (05/03/18 0552)  . vancomycin Stopped (05/02/18 2205)   YLT:[id number] **OR** acetaminophen, albuterol, ALPRAZolam, ondansetron **OR** ondansetron (ZOFRAN) IV, sodium chloride flush, sodium chloride flush  Assesment: She was admitted with sepsis related to pneumonia.  She does not appear to be septic at this point.  She still has significant chest congestion.  She has had acute hypoxic respiratory failure and required BiPAP but did not use BiPAP last night.  She has severe dysphagia and uses tube feedings.  I do not think she is quite ready to move out of the stepdown unit Principal Problem:   Sepsis due to undetermined organism [facility]) Active Problems:   Dysphagia, pharyngoesophageal phase   Malnutrition of moderate degree (HCC)   Lactic acidosis   Hyponatremia   Elevated AST (SGOT)   Acute respiratory failure (HCC)   Sinus tachycardia   Hyperglycemia   Arterial hypotension   Palliative care by specialist   Goals of care,  counseling/discussion   DNR (do not resuscitate) discussion    Plan: Continue current treatments.  Restart home meds.  Flutter valve.  Add Mucinex.    LOS: 3 days   Jadakiss Barish L 05/03/2018, 8:06 AM

## 2018-05-03 NOTE — Progress Notes (Signed)
Patient is resting. She remains with pharyngeal secretions. No issues regarding gastric feeding.  No reflux of gastric contents noted around the G-tube site. She has peristomal erythema. Bolster was pulled back to take the tension off the skin. Site cleaned with water and covered with dressing. Will apply Mycolog-II cream to peristomal skin EID until rash resolves. Please call if there are any issues with gastrostomy tube.

## 2018-05-03 NOTE — Progress Notes (Signed)
Palliative: [name] is resting quietly in bed. She greets me, making and keeping eye contact.  Unfortunately, she appears more frail today.  She tells me that she is not sleeping at night, this time due to not having her regular at home medication.  Present today is RD, but no family at this time.   [name] tells me that she has not had a BM since Tuesday.  We talked about a bowel regimen and suppository. I encouraged [name] to cough, clear congestion as often as possible.  She states that she is trying. Conference with nursing staff related to plan of care related to bowel management.  They share that she has been asking nursing staff to do things that she is normally able to accomplish.  Nursing staff is encouraging [name] to do all that she can for herself.    Contact from nursing staff stating that granddaughter, Boneta LucksJenny is in family room requesting conference.  Boneta LucksJenny and I talk about Mrs. Kizzie IdeGilmore's acute health problems.  We also talked about subject such as [facility], home health, and hospice services.  I encouraged Boneta LucksJenny to give 24 to 48 hours more for outcomes.  We discussed the seriousness of bacteremia, and the treatment plan with the hopes of returning to independent living.  Boneta LucksJenny is tearful during our conversation.  I encouraged her to lean on staff for support.  40 minutes Lillia Carmelasha Dove, NP Palliative Medicine Team Team Phone # [phone]

## 2018-05-03 NOTE — Progress Notes (Signed)
Pt doesn't wish to wear CPAP. Brooke,RN at bedside for conversation

## 2018-05-04 LAB — CBC WITH DIFFERENTIAL/PLATELET
Basophils Absolute: 0 10*3/uL (ref 0.0–0.1)
Basophils Relative: 0 %
EOS PCT: 0 %
Eosinophils Absolute: 0 10*3/uL (ref 0.0–0.7)
HEMATOCRIT: 37 % (ref 36.0–46.0)
Hemoglobin: 11.7 g/dL — ABNORMAL LOW (ref 12.0–15.0)
LYMPHS ABS: 0.9 10*3/uL (ref 0.7–4.0)
LYMPHS PCT: 9 %
MCH: 31.5 pg (ref 26.0–34.0)
MCHC: 31.6 g/dL (ref 30.0–36.0)
MCV: 99.7 fL (ref 78.0–100.0)
Monocytes Absolute: 0.7 10*3/uL (ref 0.1–1.0)
Monocytes Relative: 7 %
NEUTROS ABS: 8.3 10*3/uL (ref 1.7–7.7)
Neutrophils Relative %: 84 %
PLATELETS: 136 10*3/uL — AB (ref 150–400)
RBC: 3.71 MIL/uL — AB (ref 3.87–5.11)
RDW: 13.2 % (ref 11.5–15.5)
WBC: 10 10*3/uL (ref 4.0–10.5)

## 2018-05-04 LAB — CULTURE, BLOOD (ROUTINE X 2)

## 2018-05-04 LAB — BASIC METABOLIC PANEL
Anion gap: 4 — ABNORMAL LOW (ref 5–15)
BUN: 21 mg/dL — ABNORMAL HIGH (ref 6–20)
CHLORIDE: 103 mmol/L (ref 101–111)
CO2: 30 mmol/L (ref 22–32)
Calcium: 8.7 mg/dL — ABNORMAL LOW (ref 8.9–10.3)
Creatinine, Ser: 0.52 mg/dL (ref 0.44–1.00)
GFR calc Af Amer: >60 mL/min (ref >60)
GLUCOSE: 169 mg/dL — AB (ref 65–99)
POTASSIUM: 4.3 mmol/L (ref 3.5–5.1)
Sodium: 137 mmol/L (ref 135–145)

## 2018-05-04 LAB — GLUCOSE, CAPILLARY
GLUCOSE-CAPILLARY: 156 mg/dL — AB (ref 65–99)
GLUCOSE-CAPILLARY: 146 mg/dL — AB (ref 65–99)
Glucose-Capillary: 124 mg/dL — ABNORMAL HIGH (ref 65–99)
Glucose-Capillary: 104 mg/dL — ABNORMAL HIGH (ref 65–99)

## 2018-05-04 MED ORDER — METOPROLOL TARTRATE 5 MG/5ML IV SOLN
5.0000 mg | Freq: Once | INTRAVENOUS | Status: AC
  Administered 2018-05-04: 5 mg via INTRAVENOUS
  Filled 2018-05-04: qty 5

## 2018-05-04 MED ORDER — SODIUM CHLORIDE 0.9 % IV BOLUS
500.0000 mL | Freq: Once | INTRAVENOUS | Status: AC
  Administered 2018-05-04: 500 mL via INTRAVENOUS

## 2018-05-04 MED ORDER — ACETYLCYSTEINE 20 % IN SOLN
3.0000 mL | Freq: Four times a day (QID) | RESPIRATORY_TRACT | Status: DC
  Administered 2018-05-04 – 2018-05-06 (×7): 3 mL via RESPIRATORY_TRACT
  Filled 2018-05-04 (×7): qty 4

## 2018-05-04 NOTE — Progress Notes (Signed)
Subjective: She still has a lot of respiratory congestion.  She is not able to cough much up.  Her speech is more difficult to understand than usual.  Objective: Vital signs in last 24 hours: Temp:  [99 F (37.2 C)-100.8 F (38.2 C)] 100.8 F (38.2 C) (06/22 0600) Pulse Rate:  [79-98] 86 (06/22 0600) Resp:  [17-29] 17 (06/22 0600) BP: (127-130)/(63-69) 130/63 (06/21 2143) SpO2:  [86 %-100 %] 96 % (06/22 0600) FiO2 (%):  [60 %] 60 % (06/22 0207) Weight change:  Last BM Date: 05/03/2018  Intake/Output from previous day: 06/21 0701 - 06/22 0700 In: 4221.6 [I.V.:1875; NG/GT:1928.7; IV Piggyback:317.9] Out: 950 [Urine:950]  PHYSICAL EXAM General appearance: alert, cooperative and moderate distress Resp: She still has marked rhonchi bilaterally Cardio: regular rate and rhythm, S1, S2 normal, no murmur, click, rub or gallop GI: soft, non-tender; bowel sounds normal; no masses,  no organomegaly Extremities: extremities normal, atraumatic, no cyanosis or edema  Lab Results:  Results for orders placed or performed during the hospital encounter of 05/06/2018 (from the past 48 hour(s))  Glucose, capillary     Status: None   Collection Time: 05/02/18 11:35 AM  Result Value Ref Range   Glucose-Capillary 68 65 - 99 mg/dL  Glucose, capillary     Status: Abnormal   Collection Time: 05/02/18  4:34 PM  Result Value Ref Range   Glucose-Capillary 108 (H) 65 - 99 mg/dL  Glucose, capillary     Status: Abnormal   Collection Time: 05/02/18  9:33 PM  Result Value Ref Range   Glucose-Capillary 182 (H) 65 - 99 mg/dL  CBC with Differential     Status: Abnormal   Collection Time: 05/03/18  5:45 AM  Result Value Ref Range   WBC 8.8 4.0 - 10.5 K/uL   RBC 3.62 (L) 3.87 - 5.11 MIL/uL   Hemoglobin 11.4 (L) 12.0 - 15.0 g/dL   HCT 35.9 (L) 36.0 - 46.0 %   MCV 99.2 78.0 - 100.0 fL   MCH 31.5 26.0 - 34.0 pg   MCHC 31.8 30.0 - 36.0 g/dL   RDW 13.2 11.5 - 15.5 %   Platelets 132 (L) 150 - 400 K/uL   Neutrophils Relative % 86 %   Neutro Abs 7.6 1.7 - 7.7 K/uL   Lymphocytes Relative 8 %   Lymphs Abs 0.7 0.7 - 4.0 K/uL   Monocytes Relative 6 %   Monocytes Absolute 0.5 0.1 - 1.0 K/uL   Eosinophils Relative 0 %   Eosinophils Absolute 0.0 0.0 - 0.7 K/uL   Basophils Relative 0 %   Basophils Absolute 0.0 0.0 - 0.1 K/uL    Comment: Performed at [facility], [address], [place], [place] [address]  Basic metabolic panel     Status: Abnormal   Collection Time: 05/03/18  5:45 AM  Result Value Ref Range   Sodium 138 135 - 145 mmol/L   Potassium 3.5 3.5 - 5.1 mmol/L   Chloride 104 101 - 111 mmol/L   CO2 29 22 - 32 mmol/L   Glucose, Bld 179 (H) 65 - 99 mg/dL   BUN 26 (H) 6 - 20 mg/dL   Creatinine, Ser 0.61 0.44 - 1.00 mg/dL   Calcium 8.8 (L) 8.9 - 10.3 mg/dL   GFR calc non Af Amer >60 >60 mL/min   GFR calc Af Amer >60 >60 mL/min    Comment: (NOTE) The eGFR has been calculated using the CKD EPI equation. This calculation has not been validated in all clinical  situations. eGFR's persistently <60 mL/min signify possible Chronic Kidney Disease.    Anion gap 5 5 - 15    Comment: Performed at [facility], [address], [place], [place] [address]  Glucose, capillary     Status: Abnormal   Collection Time: 05/03/18  7:49 AM  Result Value Ref Range   Glucose-Capillary 163 (H) 65 - 99 mg/dL  Glucose, capillary     Status: Abnormal   Collection Time: 05/03/18 11:55 AM  Result Value Ref Range   Glucose-Capillary 112 (H) 65 - 99 mg/dL  Glucose, capillary     Status: Abnormal   Collection Time: 05/03/18  5:12 PM  Result Value Ref Range   Glucose-Capillary 122 (H) 65 - 99 mg/dL  Glucose, capillary     Status: Abnormal   Collection Time: 05/03/18 10:04 PM  Result Value Ref Range   Glucose-Capillary 121 (H) 65 - 99 mg/dL   Comment 1 Notify RN    Comment 2 Document in Chart   CBC with Differential     Status: Abnormal   Collection Time: 05/04/18  4:05 AM  Result Value Ref Range   WBC  10.0 4.0 - 10.5 K/uL   RBC 3.71 (L) 3.87 - 5.11 MIL/uL   Hemoglobin 11.7 (L) 12.0 - 15.0 g/dL   HCT 37.0 36.0 - 46.0 %   MCV 99.7 78.0 - 100.0 fL   MCH 31.5 26.0 - 34.0 pg   MCHC 31.6 30.0 - 36.0 g/dL   RDW 13.2 11.5 - 15.5 %   Platelets 136 (L) 150 - 400 K/uL   Neutrophils Relative % 84 %   Neutro Abs 8.3 1.7 - 7.7 K/uL   Lymphocytes Relative 9 %   Lymphs Abs 0.9 0.7 - 4.0 K/uL   Monocytes Relative 7 %   Monocytes Absolute 0.7 0.1 - 1.0 K/uL   Eosinophils Relative 0 %   Eosinophils Absolute 0.0 0.0 - 0.7 K/uL   Basophils Relative 0 %   Basophils Absolute 0.0 0.0 - 0.1 K/uL    Comment: Performed at [facility], [address], [place], [place] [address]  Basic metabolic panel     Status: Abnormal   Collection Time: 05/04/18  4:05 AM  Result Value Ref Range   Sodium 137 135 - 145 mmol/L   Potassium 4.3 3.5 - 5.1 mmol/L    Comment: DELTA CHECK NOTED   Chloride 103 101 - 111 mmol/L   CO2 30 22 - 32 mmol/L   Glucose, Bld 169 (H) 65 - 99 mg/dL   BUN 21 (H) 6 - 20 mg/dL   Creatinine, Ser 0.52 0.44 - 1.00 mg/dL   Calcium 8.7 (L) 8.9 - 10.3 mg/dL   GFR calc non Af Amer >60 >60 mL/min   GFR calc Af Amer >60 >60 mL/min    Comment: (NOTE) The eGFR has been calculated using the CKD EPI equation. This calculation has not been validated in all clinical situations. eGFR's persistently <60 mL/min signify possible Chronic Kidney Disease.    Anion gap 4 (L) 5 - 15    Comment: Performed at [facility], [address], [place], [place] [address]  Glucose, capillary     Status: Abnormal   Collection Time: 05/04/18  7:43 AM  Result Value Ref Range   Glucose-Capillary 156 (H) 65 - 99 mg/dL    ABGS No results for input(s): PHART, PO2ART, TCO2, HCO3 in the last 72 hours.  Invalid input(s): PCO2 CULTURES Recent Results (from the past 240 hour(s))  Urine  culture     Status: None   Collection Time: 05/02/2018  8:05 PM  Result Value Ref Range Status   Specimen Description   Final    URINE,  CATHETERIZED Performed at [facility], [address] [place], [place] [address]    Special Requests   Final    Normal Performed at [facility], [address] [place], [place] [address]    Culture   Final    NO GROWTH Performed at [facility], [place] [address] [place], [place] [address]    Report Status 05/02/2018 FINAL  Final  Blood Culture (routine x 2)     Status: Abnormal   Collection Time: 05/08/2018  8:09 PM  Result Value Ref Range Status   Specimen Description   Final    BLOOD LEFT HAND Performed at [facility], [address] [place], [place] [address]    Special Requests   Final    BOTTLES DRAWN AEROBIC AND ANAEROBIC Blood Culture results may not be optimal due to an inadequate volume of blood received in culture bottles Performed at [facility], [address], [place], [place] [address]    Culture  Setup Time   Final    GRAM POSITIVE COCCI Gram Stain Report Called to,Read Back By and Verified With: MOTLEY,J. AT 1062 ON 05/01/2018 BY EVA IN BOTH AEROBIC AND ANAEROBIC BOTTLES Performed at [place], READ BACK BY AND VERIFIED WITHLynwood Dawley RN [id number] 05/01/18 A BROWNING    Culture (A)  Final    STAPHYLOCOCCUS SPECIES (COAGULASE NEGATIVE) THE SIGNIFICANCE OF ISOLATING THIS ORGANISM FROM A SINGLE SET OF BLOOD CULTURES WHEN MULTIPLE SETS ARE DRAWN IS UNCERTAIN. PLEASE NOTIFY THE MICROBIOLOGY DEPARTMENT WITHIN ONE WEEK IF SPECIATION AND SENSITIVITIES ARE REQUIRED. Performed at [facility], [place] [address] [place], [place] [address]    Report Status 05/04/2018 FINAL  Final  Blood Culture ID Panel (Reflexed)     Status: Abnormal   Collection Time: 04/15/2018  8:09 PM  Result Value Ref Range Status   Enterococcus species NOT DETECTED NOT DETECTED Final   Listeria monocytogenes NOT DETECTED NOT DETECTED Final   Staphylococcus species DETECTED (A) NOT DETECTED Final    Comment: Methicillin (oxacillin) resistant coagulase negative  staphylococcus. Possible blood culture contaminant (unless isolated from more than one blood culture draw or clinical case suggests pathogenicity). No antibiotic treatment is indicated for blood  culture contaminants. CRITICAL RESULT CALLED TO, READ BACK BY AND VERIFIED WITHLynwood Dawley RN [id number] 05/01/18 A BROWNING    Staphylococcus aureus NOT DETECTED NOT DETECTED Final   Methicillin resistance DETECTED (A) NOT DETECTED Final    Comment: CRITICAL RESULT CALLED TO, READ BACK BY AND VERIFIED WITHLynwood Dawley RN [phone] 05/01/18 A BROWNING    Streptococcus species NOT DETECTED NOT DETECTED Final   Streptococcus agalactiae NOT DETECTED NOT DETECTED Final   Streptococcus pneumoniae NOT DETECTED NOT DETECTED Final   Streptococcus pyogenes NOT DETECTED NOT DETECTED Final   Acinetobacter baumannii NOT DETECTED NOT DETECTED Final   Enterobacteriaceae species NOT DETECTED NOT DETECTED Final   Enterobacter cloacae complex NOT DETECTED NOT DETECTED Final   Escherichia coli NOT DETECTED NOT DETECTED Final   Klebsiella oxytoca NOT DETECTED NOT DETECTED Final   Klebsiella pneumoniae NOT DETECTED NOT DETECTED Final   Proteus species NOT DETECTED NOT DETECTED Final   Serratia marcescens NOT DETECTED NOT DETECTED Final   Haemophilus influenzae NOT DETECTED NOT DETECTED Final   Neisseria meningitidis  NOT DETECTED NOT DETECTED Final   Pseudomonas aeruginosa NOT DETECTED NOT DETECTED Final   Candida albicans NOT DETECTED NOT DETECTED Final   Candida glabrata NOT DETECTED NOT DETECTED Final   Candida krusei NOT DETECTED NOT DETECTED Final   Candida parapsilosis NOT DETECTED NOT DETECTED Final   Candida tropicalis NOT DETECTED NOT DETECTED Final    Comment: Performed at [facility], [place] [address], [place], [place] [address]  Blood Culture (routine x 2)     Status: None (Preliminary result)   Collection Time: 05/01/2018  9:20 PM  Result Value Ref Range Status   Specimen Description BLOOD LEFT ARM  Final    Special Requests   Final    BOTTLES DRAWN AEROBIC AND ANAEROBIC Blood Culture adequate volume   Culture   Final    NO GROWTH 4 DAYS Performed at [facility], [address] [place], [place] [address]    Report Status PENDING  Incomplete  MRSA PCR Screening     Status: None   Collection Time: 05/10/2018 11:40 PM  Result Value Ref Range Status   MRSA by PCR NEGATIVE NEGATIVE Final    Comment:        The GeneXpert MRSA Assay (FDA approved for NASAL specimens only), is one component of a comprehensive MRSA colonization surveillance program. It is not intended to diagnose MRSA infection nor to guide or monitor treatment for MRSA infections. Performed at [facility], [address] [place], [place] [address]    Studies/Results: No results found.  Medications:  Prior to Admission:  Medications Prior to Admission  Medication Sig Dispense Refill Last Dose  . ALPRAZolam (XANAX) 0.5 MG tablet Take 1 tablet (0.5 mg total) by mouth 3 (three) times daily as needed for anxiety.  0 05/10/2018 at Unknown time  . calcium carbonate, dosed in mg elemental calcium, 1250 (500 CA) MG/5ML Place 5 mLs (500 mg of elemental calcium total) into feeding tube 2 (two) times daily with a meal. 450 mL  Past Week at Unknown time  . gabapentin (NEURONTIN) 300 MG capsule Take 1 capsule by mouth 3 (three) times daily.  11 04/29/2018 at Unknown time  . magnesium citrate SOLN 1 cc twice daily   Past Week at Unknown time  . meclizine (ANTIVERT) 25 MG tablet Take 25 mg by mouth at bedtime.   04/13/2018 at Unknown time  . Nutritional Supplements (FEEDING SUPPLEMENT, JEVITY 1.2 CAL,) LIQD Place 1,000 mLs into feeding tube daily.  0 05/10/2018 at Unknown time  . nystatin cream (MYCOSTATIN) APPLY TO AFFECTED AREAS TWICE DAILY. USE WITH TRIAMCINOLONE CREAM. 15 g 0 Past Week at Unknown time  . Potassium Chloride 40 MEQ/15ML (20%) SOLN Take 15 mLs by mouth 2 (two) times daily.   Past Week at Unknown time  . ranitidine (ZANTAC)  15 MG/ML syrup Take 150 mg by mouth 2 (two) times daily.    04/15/2018 at Unknown time  . silver nitrate applicators 13-24 % applicator Apply topically daily. Apply silver nitrate solution to granulation tissue 3 times a week as directed until it is completely resolved. 100 each 0 Past Week at Unknown time  . triamcinolone cream (KENALOG) 0.1 % APPLY TO AFFECTED AREAS TWICE DAILY. USE WITH NYSTATIN CREAM. 15 g 0 Past Week at Unknown time   Scheduled: . acetylcysteine  3 mL Nebulization Q6H  . chlorhexidine  15 mL Mouth Rinse BID  . Chlorhexidine Gluconate Cloth  6 each Topical Daily  . Chlorhexidine Gluconate Cloth  6 each  Topical Daily  . enoxaparin (LOVENOX) injection  40 mg Subcutaneous Q24H  . famotidine  20 mg Per Tube Daily  . feeding supplement (PRO-STAT SUGAR FREE 64)  30 mL Per Tube Daily  . gabapentin  300 mg Oral TID  . guaiFENesin  10 mL Per Tube Q6H  . insulin aspart  0-9 Units Subcutaneous TID WC  . ipratropium-albuterol  3 mL Nebulization Q6H  . meclizine  25 mg Oral QHS  . mouth rinse  15 mL Mouth Rinse q12n4p  . nystatin-triamcinolone   Topical BID  . potassium chloride  40 mEq Oral BID  . senna-docusate  1 tablet Oral QHS  . sodium chloride flush  10-40 mL Intracatheter Q12H  . sodium chloride flush  10-40 mL Intracatheter Q12H   Continuous: . sodium chloride 75 mL/hr at 05/04/18 0418  . feeding supplement (JEVITY 1.2 CAL) 55 mL/hr at 05/04/18 0400  . piperacillin-tazobactam (ZOSYN)  IV Stopped (05/04/18 0935)   BLT:[id number] **OR** acetaminophen, albuterol, ALPRAZolam, bisacodyl, ondansetron **OR** ondansetron (ZOFRAN) IV, sodium chloride flush, sodium chloride flush  Assesment: She was admitted with sepsis likely from pneumonia.  She has acute respiratory failure and is still having significant issues with clearing secretions.  I do not think she has sepsis now.  Her pneumonia does not seem to be improving.  She has severe esophageal dysfunction and I think she  aspirated  She is very weak and she is having trouble clearing her secretions.  She has malnutrition at baseline  She remains quite sick.  She has multiple issues including the sepsis elevation of liver function testing respiratory failure relative hypotension probable recurrent aspiration.  I am concerned that she may not survive this episode Principal Problem:   Sepsis due to undetermined organism [facility]) Active Problems:   Dysphagia, pharyngoesophageal phase   Malnutrition of moderate degree (HCC)   Lactic acidosis   Hyponatremia   Elevated AST (SGOT)   Acute respiratory failure (HCC)   Sinus tachycardia   Hyperglycemia   Arterial hypotension   Palliative care by specialist   Goals of care, counseling/discussion   DNR (do not resuscitate) discussion    Plan: Discontinue vancomycin since this does not appear to be any sort of staph.  Continue with Zosyn for aspiration pneumonia.  Repeat chest x-ray in the morning.  Add Mucomyst and see if she can get anything up.    LOS: 4 days   Metztli Sachdev L 05/04/2018, 10:13 AM

## 2018-05-04 NOTE — Progress Notes (Addendum)
Pharmacy Antibiotic Note  Day  #5 of Zosyn therapy for this [age] yof admitted on 2017-11-14 with sepsis.  Vancomycin was discontinued  today, as only 1 of 2 blood cultures showed CONS and MRSA PCR is negative.  Patient is febrile currently, with a normal WBC count. She has been taken off BIPAP today and is on HFNC.      Plan: Continue Zosyn 3.375g IV q8h (4-hr infusion)  Pharmacy will continue to monitor labs and patient's renal function.   Height: 5\' 5"  (165.1 cm) Weight: 138 lb 14.2 oz (63 kg) IBW/kg (Calculated) : 57  Temp (24hrs), Avg:99.8 F (37.7 C), Min:98.6 F (37 C), Max:100.8 F (38.2 C)  Recent Labs  Lab 2018/07/21 2009 2018/07/21 2120 2018/07/21 2159 05/01/18 0124 05/01/18 0541 05/01/18 0752 05/02/18 0421 05/03/18 0545 05/04/18 0405  WBC 8.1  --   --   --  5.7  --  10.7* 8.8 10.0  CREATININE 0.93  --   --   --  0.89  --  0.68 0.61 0.52  LATICACIDVEN  --  3.89* 5.09* 3.1* 3.4* 2.5*  --   --   --     Estimated Creatinine Clearance: 42.1 mL/min (by C-G formula based on SCr of 0.52 mg/dL).    No Known Allergies  Antimicrobials this admission: Zosyn 6/18 >>  Vanco 6/18 >> 6/22  Dose adjustments this admission: N/A  Microbiology results: 6/18 BCx: 1/2 = CONS (MRSA +), likely contaminant. 6/18 UCx: normal     Thank you for allowing pharmacy to be a part of this patient's care.  Tama Highamara Zarinah Oviatt 05/04/2018 9:36 AM

## 2018-05-04 NOTE — Progress Notes (Signed)
Pt requested to come off BIPAP. Pt taken off BIPAP and returned to HFNC

## 2018-05-04 NOTE — Progress Notes (Signed)
Pt agreed to wear BIPAP to help with WOB

## 2018-05-05 ENCOUNTER — Inpatient Hospital Stay (HOSPITAL_COMMUNITY): Payer: Medicare Other

## 2018-05-05 DIAGNOSIS — R1314 Dysphagia, pharyngoesophageal phase: Secondary | ICD-10-CM

## 2018-05-05 DIAGNOSIS — J690 Pneumonitis due to inhalation of food and vomit: Secondary | ICD-10-CM

## 2018-05-05 DIAGNOSIS — A419 Sepsis, unspecified organism: Principal | ICD-10-CM

## 2018-05-05 DIAGNOSIS — J9601 Acute respiratory failure with hypoxia: Secondary | ICD-10-CM

## 2018-05-05 LAB — CBC WITH DIFFERENTIAL/PLATELET
BASOS ABS: 0 10*3/uL (ref 0.0–0.1)
Basophils Relative: 0 %
EOS ABS: 0.3 10*3/uL (ref 0.0–0.7)
EOS PCT: 2 %
HEMATOCRIT: 36.2 % (ref 36.0–46.0)
Hemoglobin: 11.2 g/dL — ABNORMAL LOW (ref 12.0–15.0)
Lymphocytes Relative: 8 %
Lymphs Abs: 0.9 10*3/uL (ref 0.7–4.0)
MCH: 31 pg (ref 26.0–34.0)
MCHC: 30.9 g/dL (ref 30.0–36.0)
MCV: 100.3 fL — ABNORMAL HIGH (ref 78.0–100.0)
MONO ABS: 0.7 10*3/uL (ref 0.1–1.0)
Monocytes Relative: 6 %
NEUTROS ABS: 10.1 10*3/uL — AB (ref 1.7–7.7)
Neutrophils Relative %: 84 %
PLATELETS: 161 10*3/uL (ref 150–400)
RBC: 3.61 MIL/uL — ABNORMAL LOW (ref 3.87–5.11)
RDW: 13.3 % (ref 11.5–15.5)
WBC: 12 10*3/uL — ABNORMAL HIGH (ref 4.0–10.5)

## 2018-05-05 LAB — URINALYSIS, COMPLETE (UACMP) WITH MICROSCOPIC
BILIRUBIN URINE: NEGATIVE
Bacteria, UA: NONE SEEN
Glucose, UA: NEGATIVE mg/dL
Hgb urine dipstick: NEGATIVE
KETONES UR: NEGATIVE mg/dL
Leukocytes, UA: NEGATIVE
NITRITE: NEGATIVE
PH: 7 (ref 5.0–8.0)
Protein, ur: NEGATIVE mg/dL
Specific Gravity, Urine: 1.016 (ref 1.005–1.030)

## 2018-05-05 LAB — GLUCOSE, CAPILLARY
GLUCOSE-CAPILLARY: 166 mg/dL — AB (ref 65–99)
GLUCOSE-CAPILLARY: 184 mg/dL — AB (ref 65–99)
GLUCOSE-CAPILLARY: 187 mg/dL — AB (ref 65–99)
Glucose-Capillary: 160 mg/dL — ABNORMAL HIGH (ref 65–99)

## 2018-05-05 LAB — CULTURE, BLOOD (ROUTINE X 2)
CULTURE: NO GROWTH
Special Requests: ADEQUATE

## 2018-05-05 MED ORDER — FUROSEMIDE 10 MG/ML IJ SOLN
40.0000 mg | Freq: Two times a day (BID) | INTRAMUSCULAR | Status: DC
  Administered 2018-05-05 (×2): 40 mg via INTRAVENOUS
  Filled 2018-05-05 (×2): qty 4

## 2018-05-05 MED ORDER — INSULIN ASPART 100 UNIT/ML ~~LOC~~ SOLN
0.0000 [IU] | SUBCUTANEOUS | Status: DC
  Administered 2018-05-06: 2 [IU] via SUBCUTANEOUS
  Administered 2018-05-06: 1 [IU] via SUBCUTANEOUS

## 2018-05-05 MED ORDER — ACETAMINOPHEN 650 MG RE SUPP: 650.0000 mg | Freq: Four times a day (QID) | RECTAL | Status: DC | PRN

## 2018-05-05 MED ORDER — GABAPENTIN 300 MG PO CAPS
300.0000 mg | ORAL_CAPSULE | Freq: Three times a day (TID) | ORAL | Status: DC
  Administered 2018-05-05 (×2): 300 mg
  Filled 2018-05-05 (×2): qty 1

## 2018-05-05 MED ORDER — DILTIAZEM HCL-DEXTROSE 100-5 MG/100ML-% IV SOLN (PREMIX): 5.0000 mg/h | INTRAVENOUS | Status: DC

## 2018-05-05 MED ORDER — MECLIZINE HCL 12.5 MG PO TABS
25.0000 mg | ORAL_TABLET | Freq: Every day | ORAL | Status: DC
  Administered 2018-05-05: 25 mg
  Filled 2018-05-05: qty 2

## 2018-05-05 MED ORDER — SENNOSIDES-DOCUSATE SODIUM 8.6-50 MG PO TABS
1.0000 | ORAL_TABLET | Freq: Every day | ORAL | Status: DC
  Administered 2018-05-05: 1
  Filled 2018-05-05: qty 1

## 2018-05-05 MED ORDER — DILTIAZEM HCL-DEXTROSE 100-5 MG/100ML-% IV SOLN (PREMIX)
5.0000 mg/h | INTRAVENOUS | Status: DC
  Administered 2018-05-05: 5 mg/h via INTRAVENOUS
  Filled 2018-05-05: qty 100

## 2018-05-05 MED ORDER — POTASSIUM CHLORIDE 20 MEQ/15ML (10%) PO SOLN
40.0000 meq | Freq: Two times a day (BID) | ORAL | Status: DC
  Administered 2018-05-05: 40 meq
  Filled 2018-05-05: qty 30

## 2018-05-05 MED ORDER — ALPRAZOLAM 0.5 MG PO TABS
0.5000 mg | ORAL_TABLET | Freq: Three times a day (TID) | ORAL | Status: DC | PRN
  Administered 2018-05-05: 0.5 mg
  Filled 2018-05-05: qty 1

## 2018-05-05 MED ORDER — DILTIAZEM LOAD VIA INFUSION
5.0000 mg | Freq: Once | INTRAVENOUS | Status: AC
  Administered 2018-05-05: 5 mg via INTRAVENOUS
  Filled 2018-05-05: qty 5

## 2018-05-05 MED ORDER — ACETAMINOPHEN 325 MG PO TABS
650.0000 mg | ORAL_TABLET | Freq: Four times a day (QID) | ORAL | Status: DC | PRN
  Administered 2018-05-05 – 2018-05-06 (×2): 650 mg
  Filled 2018-05-05 (×2): qty 2

## 2018-05-05 NOTE — Progress Notes (Signed)
Subjective: She says she feels about the same.  Chest x-ray this morning which I have personally reviewed is substantially worse.  She is still having trouble clearing secretions.  Objective: Vital signs in last 24 hours: Temp:  [99.4 F (37.4 C)-101.7 F (38.7 C)] 100.4 F (38 C) (06/23 0801) Pulse Rate:  [81-120] 83 (06/23 0801) Resp:  [16-33] 18 (06/23 0801) BP: (113-119)/(49-50) 119/49 (06/23 0210) SpO2:  [92 %-97 %] 94 % (06/23 0801) Weight:  [65.3 kg (143 lb 15.4 oz)] 65.3 kg (143 lb 15.4 oz) (06/23 0535) Weight change:  Last BM Date: 04/27/2018  Intake/Output from previous day: 06/22 0701 - 06/23 0700 In: 2492.2 [I.V.:1800; NG/GT:592.2] Out: 3650 [Urine:3650]  PHYSICAL EXAM General appearance: alert, cooperative and moderate distress Resp: rhonchi bilaterally Cardio: regular rate and rhythm, S1, S2 normal, no murmur, click, rub or gallop GI: soft, non-tender; bowel sounds normal; no masses,  no organomegaly Extremities: extremities normal, atraumatic, no cyanosis or edema  Lab Results:  Results for orders placed or performed during the hospital encounter of 05/02/2018 (from the past 48 hour(s))  Glucose, capillary     Status: Abnormal   Collection Time: 05/03/18 11:55 AM  Result Value Ref Range   Glucose-Capillary 112 (H) 65 - 99 mg/dL  Glucose, capillary     Status: Abnormal   Collection Time: 05/03/18  5:12 PM  Result Value Ref Range   Glucose-Capillary 122 (H) 65 - 99 mg/dL  Glucose, capillary     Status: Abnormal   Collection Time: 05/03/18 10:04 PM  Result Value Ref Range   Glucose-Capillary 121 (H) 65 - 99 mg/dL   Comment 1 Notify RN    Comment 2 Document in Chart   CBC with Differential     Status: Abnormal   Collection Time: 05/04/18  4:05 AM  Result Value Ref Range   WBC 10.0 4.0 - 10.5 K/uL   RBC 3.71 (L) 3.87 - 5.11 MIL/uL   Hemoglobin 11.7 (L) 12.0 - 15.0 g/dL   HCT 37.0 36.0 - 46.0 %   MCV 99.7 78.0 - 100.0 fL   MCH 31.5 26.0 - 34.0 pg   MCHC  31.6 30.0 - 36.0 g/dL   RDW 13.2 11.5 - 15.5 %   Platelets 136 (L) 150 - 400 K/uL   Neutrophils Relative % 84 %   Neutro Abs 8.3 1.7 - 7.7 K/uL   Lymphocytes Relative 9 %   Lymphs Abs 0.9 0.7 - 4.0 K/uL   Monocytes Relative 7 %   Monocytes Absolute 0.7 0.1 - 1.0 K/uL   Eosinophils Relative 0 %   Eosinophils Absolute 0.0 0.0 - 0.7 K/uL   Basophils Relative 0 %   Basophils Absolute 0.0 0.0 - 0.1 K/uL    Comment: Performed at [facility], [address] [place], [place] [address]  Basic metabolic panel     Status: Abnormal   Collection Time: 05/04/18  4:05 AM  Result Value Ref Range   Sodium 137 135 - 145 mmol/L   Potassium 4.3 3.5 - 5.1 mmol/L    Comment: DELTA CHECK NOTED   Chloride 103 101 - 111 mmol/L   CO2 30 22 - 32 mmol/L   Glucose, Bld 169 (H) 65 - 99 mg/dL   BUN 21 (H) 6 - 20 mg/dL   Creatinine, Ser 0.52 0.44 - 1.00 mg/dL   Calcium 8.7 (L) 8.9 - 10.3 mg/dL   GFR calc non Af Amer >60 >60 mL/min   GFR calc Af Amer >60 >60 mL/min  Comment: (NOTE) The eGFR has been calculated using the CKD EPI equation. This calculation has not been validated in all clinical situations. eGFR's persistently <60 mL/min signify possible Chronic Kidney Disease.    Anion gap 4 (L) 5 - 15    Comment: Performed at [facility], [address] [place], [place] [address]  Glucose, capillary     Status: Abnormal   Collection Time: 05/04/18  7:43 AM  Result Value Ref Range   Glucose-Capillary 156 (H) 65 - 99 mg/dL  Glucose, capillary     Status: Abnormal   Collection Time: 05/04/18 11:23 AM  Result Value Ref Range   Glucose-Capillary 124 (H) 65 - 99 mg/dL  Glucose, capillary     Status: Abnormal   Collection Time: 05/04/18  3:58 PM  Result Value Ref Range   Glucose-Capillary 104 (H) 65 - 99 mg/dL  Glucose, capillary     Status: Abnormal   Collection Time: 05/04/18  9:39 PM  Result Value Ref Range   Glucose-Capillary 146 (H) 65 - 99 mg/dL  CBC with Differential     Status: Abnormal    Collection Time: 05/05/18  4:54 AM  Result Value Ref Range   WBC 12.0 (H) 4.0 - 10.5 K/uL   RBC 3.61 (L) 3.87 - 5.11 MIL/uL   Hemoglobin 11.2 (L) 12.0 - 15.0 g/dL   HCT 36.2 36.0 - 46.0 %   MCV 100.3 (H) 78.0 - 100.0 fL   MCH 31.0 26.0 - 34.0 pg   MCHC 30.9 30.0 - 36.0 g/dL   RDW 13.3 11.5 - 15.5 %   Platelets 161 150 - 400 K/uL   Neutrophils Relative % 84 %   Neutro Abs 10.1 (H) 1.7 - 7.7 K/uL   Lymphocytes Relative 8 %   Lymphs Abs 0.9 0.7 - 4.0 K/uL   Monocytes Relative 6 %   Monocytes Absolute 0.7 0.1 - 1.0 K/uL   Eosinophils Relative 2 %   Eosinophils Absolute 0.3 0.0 - 0.7 K/uL   Basophils Relative 0 %   Basophils Absolute 0.0 0.0 - 0.1 K/uL    Comment: Performed at [facility], [address], [place], [place] [address]  Glucose, capillary     Status: Abnormal   Collection Time: 05/05/18  7:47 AM  Result Value Ref Range   Glucose-Capillary 160 (H) 65 - 99 mg/dL    ABGS No results for input(s): PHART, PO2ART, TCO2, HCO3 in the last 72 hours.  Invalid input(s): PCO2 CULTURES Recent Results (from the past 240 hour(s))  Urine culture     Status: None   Collection Time: 04/26/2018  8:05 PM  Result Value Ref Range Status   Specimen Description   Final    URINE, CATHETERIZED Performed at [facility], [address] [place], [place] [address]    Special Requests   Final    Normal Performed at [facility], [address], [place], [place] [address]    Culture   Final    NO GROWTH Performed at [facility], [place] [address], [place], [place] [address]    Report Status 05/02/2018 FINAL  Final  Blood Culture (routine x 2)     Status: Abnormal   Collection Time: 04/28/2018  8:09 PM  Result Value Ref Range Status   Specimen Description   Final    BLOOD LEFT HAND Performed at [facility], [address] [place], [place] [address]    Special Requests   Final    BOTTLES DRAWN AEROBIC AND ANAEROBIC  Blood Culture results may not be optimal due to an inadequate volume  of blood received in culture bottles Performed at [facility], [address], [place], [place] [address]    Culture  Setup Time   Final    GRAM POSITIVE COCCI Gram Stain Report Called to,Read Back By and Verified With: MOTLEY,J. AT [id number] ON 05/01/2018 BY EVA IN BOTH AEROBIC AND ANAEROBIC BOTTLES Performed at [facility], READ BACK BY AND VERIFIED WITHLynwood Dawley RN [id number] 05/01/18 A BROWNING    Culture (A)  Final    STAPHYLOCOCCUS SPECIES (COAGULASE NEGATIVE) THE SIGNIFICANCE OF ISOLATING THIS ORGANISM FROM A SINGLE SET OF BLOOD CULTURES WHEN MULTIPLE SETS ARE DRAWN IS UNCERTAIN. PLEASE NOTIFY THE MICROBIOLOGY DEPARTMENT WITHIN ONE WEEK IF SPECIATION AND SENSITIVITIES ARE REQUIRED. Performed at [facility], [place] [address] [place], [place] [address]    Report Status 05/04/2018 FINAL  Final  Blood Culture ID Panel (Reflexed)     Status: Abnormal   Collection Time: 04/13/2018  8:09 PM  Result Value Ref Range Status   Enterococcus species NOT DETECTED NOT DETECTED Final   Listeria monocytogenes NOT DETECTED NOT DETECTED Final   Staphylococcus species DETECTED (A) NOT DETECTED Final    Comment: Methicillin (oxacillin) resistant coagulase negative staphylococcus. Possible blood culture contaminant (unless isolated from more than one blood culture draw or clinical case suggests pathogenicity). No antibiotic treatment is indicated for blood  culture contaminants. CRITICAL RESULT CALLED TO, READ BACK BY AND VERIFIED WITHLynwood Dawley RN [id number] 05/01/18 A BROWNING    Staphylococcus aureus NOT DETECTED NOT DETECTED Final   Methicillin resistance DETECTED (A) NOT DETECTED Final    Comment: CRITICAL RESULT CALLED TO, READ BACK BY AND VERIFIED WITHLynwood Dawley RN [phone] 05/01/18 A BROWNING    Streptococcus species NOT DETECTED NOT DETECTED Final   Streptococcus agalactiae NOT DETECTED NOT DETECTED Final   Streptococcus pneumoniae NOT DETECTED NOT DETECTED Final    Streptococcus pyogenes NOT DETECTED NOT DETECTED Final   Acinetobacter baumannii NOT DETECTED NOT DETECTED Final   Enterobacteriaceae species NOT DETECTED NOT DETECTED Final   Enterobacter cloacae complex NOT DETECTED NOT DETECTED Final   Escherichia coli NOT DETECTED NOT DETECTED Final   Klebsiella oxytoca NOT DETECTED NOT DETECTED Final   Klebsiella pneumoniae NOT DETECTED NOT DETECTED Final   Proteus species NOT DETECTED NOT DETECTED Final   Serratia marcescens NOT DETECTED NOT DETECTED Final   Haemophilus influenzae NOT DETECTED NOT DETECTED Final   Neisseria meningitidis NOT DETECTED NOT DETECTED Final   Pseudomonas aeruginosa NOT DETECTED NOT DETECTED Final   Candida albicans NOT DETECTED NOT DETECTED Final   Candida glabrata NOT DETECTED NOT DETECTED Final   Candida krusei NOT DETECTED NOT DETECTED Final   Candida parapsilosis NOT DETECTED NOT DETECTED Final   Candida tropicalis NOT DETECTED NOT DETECTED Final    Comment: Performed at [facility], [place]. [address], [place], [place] [address]  Blood Culture (routine x 2)     Status: None   Collection Time: 05/10/2018  9:20 PM  Result Value Ref Range Status   Specimen Description BLOOD LEFT ARM  Final   Special Requests   Final    BOTTLES DRAWN AEROBIC AND ANAEROBIC Blood Culture adequate volume   Culture   Final    NO GROWTH 5 DAYS Performed at [facility], [address] [place], [place] [address]    Report Status 05/05/2018 FINAL  Final  MRSA PCR Screening  Status: None   Collection Time: 04/29/2018 11:40 PM  Result Value Ref Range Status   MRSA by PCR NEGATIVE NEGATIVE Final    Comment:        The GeneXpert MRSA Assay (FDA approved for NASAL specimens only), is one component of a comprehensive MRSA colonization surveillance program. It is not intended to diagnose MRSA infection nor to guide or monitor treatment for MRSA infections. Performed at [facility], [address], [place], [place] [address]     Studies/Results: Dg Chest Port 1 View  Result Date: 05/05/2018 CLINICAL DATA:  Respiratory failure.  Follow-up exam. EXAM: PORTABLE CHEST 1 VIEW COMPARISON:  CT and chest radiographs, 04/13/2018. FINDINGS: There has been interval worsening in lung aeration since the prior exams. There is new opacity in the right upper lobe with elevation of the minor fissure. There is increased left perihilar opacity. There has been an increase in opacity at both lung bases, some of which is due to new bilateral effusions. New left PICC has its tip near the caval atrial junction, well positioned. No pneumothorax. IMPRESSION: 1. There has been interval worsening in lung aeration since prior study. This is likely combination of worsened multifocal pneumonia with the development of bilateral pleural effusions. 2. New left PICC is well positioned. Electronically Signed   By: Lajean Manes M.D.   On: 05/05/2018 09:43    Medications:  Prior to Admission:  Medications Prior to Admission  Medication Sig Dispense Refill Last Dose  . ALPRAZolam (XANAX) 0.5 MG tablet Take 1 tablet (0.5 mg total) by mouth 3 (three) times daily as needed for anxiety.  0 05/02/2018 at Unknown time  . calcium carbonate, dosed in mg elemental calcium, 1250 (500 CA) MG/5ML Place 5 mLs (500 mg of elemental calcium total) into feeding tube 2 (two) times daily with a meal. 450 mL  Past Week at Unknown time  . gabapentin (NEURONTIN) 300 MG capsule Take 1 capsule by mouth 3 (three) times daily.  11 05/12/2018 at Unknown time  . magnesium citrate SOLN 1 cc twice daily   Past Week at Unknown time  . meclizine (ANTIVERT) 25 MG tablet Take 25 mg by mouth at bedtime.   04/28/2018 at Unknown time  . Nutritional Supplements (FEEDING SUPPLEMENT, JEVITY 1.2 CAL,) LIQD Place 1,000 mLs into feeding tube daily.  0 04/20/2018 at Unknown time  . nystatin cream (MYCOSTATIN) APPLY TO AFFECTED AREAS TWICE DAILY. USE WITH TRIAMCINOLONE CREAM. 15 g 0 Past Week at Unknown  time  . Potassium Chloride 40 MEQ/15ML (20%) SOLN Take 15 mLs by mouth 2 (two) times daily.   Past Week at Unknown time  . ranitidine (ZANTAC) 15 MG/ML syrup Take 150 mg by mouth 2 (two) times daily.    04/16/2018 at Unknown time  . silver nitrate applicators 39-53 % applicator Apply topically daily. Apply silver nitrate solution to granulation tissue 3 times a week as directed until it is completely resolved. 100 each 0 Past Week at Unknown time  . triamcinolone cream (KENALOG) 0.1 % APPLY TO AFFECTED AREAS TWICE DAILY. USE WITH NYSTATIN CREAM. 15 g 0 Past Week at Unknown time   Scheduled: . acetylcysteine  3 mL Nebulization Q6H  . chlorhexidine  15 mL Mouth Rinse BID  . Chlorhexidine Gluconate Cloth  6 each Topical Daily  . Chlorhexidine Gluconate Cloth  6 each Topical Daily  . enoxaparin (LOVENOX) injection  40 mg Subcutaneous Q24H  . famotidine  20 mg Per Tube Daily  . feeding supplement (PRO-STAT  SUGAR FREE 64)  30 mL Per Tube Daily  . gabapentin  300 mg Oral TID  . guaiFENesin  10 mL Per Tube Q6H  . insulin aspart  0-9 Units Subcutaneous TID WC  . ipratropium-albuterol  3 mL Nebulization Q6H  . meclizine  25 mg Oral QHS  . mouth rinse  15 mL Mouth Rinse q12n4p  . nystatin-triamcinolone   Topical BID  . potassium chloride  40 mEq Oral BID  . senna-docusate  1 tablet Oral QHS  . sodium chloride flush  10-40 mL Intracatheter Q12H  . sodium chloride flush  10-40 mL Intracatheter Q12H   Continuous: . sodium chloride 75 mL/hr at 05/05/18 0400  . feeding supplement (JEVITY 1.2 CAL) 1,000 mL (05/05/18 0524)  . piperacillin-tazobactam (ZOSYN)  IV 3.375 g (05/05/18 0501)   IOM:[id number] **OR** acetaminophen, albuterol, ALPRAZolam, bisacodyl, ondansetron **OR** ondansetron (ZOFRAN) IV, sodium chloride flush, sodium chloride flush  Assesment: She was admitted with sepsis related to pneumonia.  She has likely aspiration pneumonia.  She is having a lot of difficulty clearing her  secretions.  She has acute hypoxic respiratory failure.  He had lactic acidemia which has resolved.  She remains on IV antibiotics.  At baseline she has severe dysphagia in the pharyngeal esophageal phase and she is been n.p.o. for over a year.  Chest x-ray is substantially worse Principal Problem:   Sepsis due to undetermined organism [facility]) Active Problems:   Dysphagia, pharyngoesophageal phase   Malnutrition of moderate degree (HCC)   Lactic acidosis   Hyponatremia   Elevated AST (SGOT)   Acute respiratory failure (HCC)   Sinus tachycardia   Hyperglycemia   Arterial hypotension   Palliative care by specialist   Goals of care, counseling/discussion   DNR (do not resuscitate) discussion    Plan: Continue treatments.  Because she is positive INO I am going to give her some Lasix and see if that will help with her breathing    LOS: 5 days   Aedon Deason L 05/05/2018, 10:17 AM

## 2018-05-05 NOTE — Progress Notes (Addendum)
Responded to nursing call:  Tachycardia and desat.  Medical record reviewed. Pt admitted with resp failure due to aspiration.  Now with progressive respiratory failure.  Has been on zosyn and BDs and mucomyst.  Tolerating TF   Subjective: Pt denies cp. C/o sob. No vomiting or diarrhea  Vitals:   05/05/18 1119 05/05/18 1500 05/05/18 1624 05/05/18 1630  BP: 135/64 (!) 117/44    Pulse: 88 91 (!) 112 (!) 108  Resp: (!) 37 19 (!) 39 (!) 38  Temp: 99.2 F (37.3 C) (!) 101 F (38.3 C) (!) 100.4 F (38 C) 100.2 F (37.9 C)  TempSrc:      SpO2: 92% 90% (!) 84% (!) 84%  Weight:      Height:       CV--IRRR Lung--bilateral rhonchi Abd--soft+BS/NT Ext-no rash-+edema   Assessment/Plan: Afib/flutter -start diltiazem drip -due to resp status -EF 55-60% Acute respiratory failure with hypoxia -aspiration and pulmonary edema -personally reviewed CXR--pulm edema and bilateral effusions with RUL infiltrate -directional suction by respiratory Fever -blood cultures -UA and urine culture  Aspiration pneumonia -continue zosyn Pulmonary Edema  -continue lasix  Total time 35 min--    Catarina Hartshornavid Betzaira Mentel, DO [facility]

## 2018-05-05 NOTE — Progress Notes (Signed)
Patient does not wish to wear BIPAP at this time. Machine is on standby at bedside if needed. Patient is on 10L HFNC with 02 saturations at 97%. RT will continue to monitor.

## 2018-05-05 NOTE — Progress Notes (Signed)
Patient noted to having liquid brown stool x3, peri area appears red, Dr. Juanetta GoslingHawkins made aware and flexiseal ordered and placed. Patient tolerated well. IV fluids decreased to KVO. Peg tube patent and flushing well, with no residual noted x2. Temp noted to be 100.8, PRN Tylenol given. Patient heart rate noted to change in to A-fib and was fluctuating between 117 up to the 140's. O2 sat noted to be 84%. Patient alert and had no complaints of shortness of breath, chest pain or discomfort. Respiratory called and suctioned patient and writer made Dr. Arbutus Leasat aware of heart rate, O2 sat and temperature. Cardizem drip ordered and started. Son in to visit and updated.

## 2018-05-05 NOTE — Progress Notes (Signed)
Offered patient a bath but did not want one during this shift. Stated she just wanted to sleep. Patient also did not want to be repositioned in the bed. She is resting comfortably. Will continue to monitor.

## 2018-05-06 DIAGNOSIS — J690 Pneumonitis due to inhalation of food and vomit: Secondary | ICD-10-CM

## 2018-05-06 LAB — CBC WITH DIFFERENTIAL/PLATELET
Basophils Absolute: 0 10*3/uL (ref 0.0–0.1)
Basophils Relative: 0 %
EOS ABS: 0.4 10*3/uL (ref 0.0–0.7)
EOS PCT: 2 %
HCT: 38.8 % (ref 36.0–46.0)
Hemoglobin: 12.1 g/dL (ref 12.0–15.0)
LYMPHS ABS: 0.9 10*3/uL (ref 0.7–4.0)
LYMPHS PCT: 5 %
MCH: 31.2 pg (ref 26.0–34.0)
MCHC: 31.2 g/dL (ref 30.0–36.0)
MCV: 100 fL (ref 78.0–100.0)
MONOS PCT: 5 %
Monocytes Absolute: 0.9 10*3/uL (ref 0.1–1.0)
Neutro Abs: 16.2 10*3/uL — ABNORMAL HIGH (ref 1.7–7.7)
Neutrophils Relative %: 88 %
PLATELETS: 216 10*3/uL (ref 150–400)
RBC: 3.88 MIL/uL (ref 3.87–5.11)
RDW: 13.3 % (ref 11.5–15.5)
WBC: 18.3 10*3/uL — AB (ref 4.0–10.5)

## 2018-05-06 LAB — BASIC METABOLIC PANEL
Anion gap: 7 (ref 5–15)
BUN: 19 mg/dL (ref 6–20)
CALCIUM: 9.5 mg/dL (ref 8.9–10.3)
CO2: 37 mmol/L — ABNORMAL HIGH (ref 22–32)
Chloride: 94 mmol/L — ABNORMAL LOW (ref 101–111)
Creatinine, Ser: 0.57 mg/dL (ref 0.44–1.00)
GFR calc Af Amer: >60 mL/min (ref >60)
GLUCOSE: 171 mg/dL — AB (ref 65–99)
POTASSIUM: 3.9 mmol/L (ref 3.5–5.1)
SODIUM: 138 mmol/L (ref 135–145)

## 2018-05-06 LAB — GLUCOSE, CAPILLARY
GLUCOSE-CAPILLARY: 158 mg/dL — AB (ref 65–99)
Glucose-Capillary: 150 mg/dL — ABNORMAL HIGH (ref 65–99)

## 2018-05-06 MED ORDER — POLYVINYL ALCOHOL 1.4 % OP SOLN
2.0000 [drp] | OPHTHALMIC | Status: DC | PRN
Start: 2018-05-06 — End: 2018-05-07

## 2018-05-06 MED ORDER — MORPHINE SULFATE (PF) 2 MG/ML IV SOLN
2.0000 mg | INTRAVENOUS | Status: DC | PRN
  Administered 2018-05-06 (×2): 2 mg via INTRAVENOUS
  Filled 2018-05-06 (×2): qty 1

## 2018-05-06 MED ORDER — LORAZEPAM 2 MG/ML IJ SOLN
1.0000 mg | INTRAMUSCULAR | Status: DC | PRN
  Administered 2018-05-06 – 2018-05-07 (×4): 1 mg via INTRAVENOUS
  Filled 2018-05-06 (×4): qty 1

## 2018-05-06 MED ORDER — ATROPINE SULFATE 1 % OP SOLN: 2.0000 [drp] | Freq: Three times a day (TID) | OPHTHALMIC | Status: DC | PRN

## 2018-05-06 MED ORDER — NYSTATIN-TRIAMCINOLONE 100000-0.1 UNIT/GM-% EX CREA: TOPICAL_CREAM | Freq: Two times a day (BID) | CUTANEOUS | Status: DC | PRN

## 2018-05-06 MED ORDER — SCOPOLAMINE 1 MG/3DAYS TD PT72
1.0000 | MEDICATED_PATCH | TRANSDERMAL | Status: DC
  Administered 2018-05-06: 1.5 mg via TRANSDERMAL
  Filled 2018-05-06: qty 1

## 2018-05-06 MED ORDER — MORPHINE SULFATE (PF) 2 MG/ML IV SOLN
2.0000 mg | INTRAVENOUS | Status: DC | PRN
  Administered 2018-05-06 – 2018-05-07 (×12): 2 mg via INTRAVENOUS
  Filled 2018-05-06 (×12): qty 1

## 2018-05-06 NOTE — Progress Notes (Signed)
RN spoke with pts son who stated he would like for nursing staff to take pts BP once a shift to see how she is doing-but he didn't want us to turn her or mess with her because he thinks it will make her uncomfortable.  When RN in room to give Morphine pt asked me several times "How long is it gonna take me to die? I'm miserable."

## 2018-05-06 NOTE — Progress Notes (Signed)
Subjective: She is had more trouble overnight.  She is been in and out of atrial fib.  She is had loose stools.  Oxygen saturation has been low.  She says she is miserable.  Discussion about her goals with her son Sonia Side present and she says she does not want to keep fighting.  She feels that she is going to die regardless and I think that is likely correct  Objective: Vital signs in last 24 hours: Temp:  [98.4 F (36.9 C)-101 F (38.3 C)] 100.2 F (37.9 C) (06/24 0600) Pulse Rate:  [73-149] 90 (06/24 0600) Resp:  [16-39] 18 (06/24 0600) BP: (82-135)/(37-83) 113/50 (06/24 0600) SpO2:  [84 %-96 %] 86 % (06/24 0600) Weight:  [60.7 kg (133 lb 13.1 oz)] 60.7 kg (133 lb 13.1 oz) (06/24 0520) Weight change: -4.6 kg (-10 lb 2.3 oz) Last BM Date: 05/05/18  Intake/Output from previous day: 06/23 0701 - 06/24 0700 In: 3259.8 [I.V.:905.7; NG/GT:2047.8; IV Piggyback:306.3] Out: 5050 [Urine:5050]  PHYSICAL EXAM General appearance: alert, cooperative and moderate distress Resp: Respiratory rate increased.  She is using accessory muscles of respiration.  She has marked bilateral wheezes and rhonchi Cardio: regular rate and rhythm, S1, S2 normal, no murmur, click, rub or gallop GI: soft, non-tender; bowel sounds normal; no masses,  no organomegaly Extremities: extremities normal, atraumatic, no cyanosis or edema  Lab Results:  Results for orders placed or performed during the hospital encounter of 04/27/2018 (from the past 48 hour(s))  Glucose, capillary     Status: Abnormal   Collection Time: 05/04/18 11:23 AM  Result Value Ref Range   Glucose-Capillary 124 (H) 65 - 99 mg/dL  Glucose, capillary     Status: Abnormal   Collection Time: 05/04/18  3:58 PM  Result Value Ref Range   Glucose-Capillary 104 (H) 65 - 99 mg/dL  Glucose, capillary     Status: Abnormal   Collection Time: 05/04/18  9:39 PM  Result Value Ref Range   Glucose-Capillary 146 (H) 65 - 99 mg/dL  CBC with Differential      Status: Abnormal   Collection Time: 05/05/18  4:54 AM  Result Value Ref Range   WBC 12.0 (H) 4.0 - 10.5 K/uL   RBC 3.61 (L) 3.87 - 5.11 MIL/uL   Hemoglobin 11.2 (L) 12.0 - 15.0 g/dL   HCT 36.2 36.0 - 46.0 %   MCV 100.3 (H) 78.0 - 100.0 fL   MCH 31.0 26.0 - 34.0 pg   MCHC 30.9 30.0 - 36.0 g/dL   RDW 13.3 11.5 - 15.5 %   Platelets 161 150 - 400 K/uL   Neutrophils Relative % 84 %   Neutro Abs 10.1 (H) 1.7 - 7.7 K/uL   Lymphocytes Relative 8 %   Lymphs Abs 0.9 0.7 - 4.0 K/uL   Monocytes Relative 6 %   Monocytes Absolute 0.7 0.1 - 1.0 K/uL   Eosinophils Relative 2 %   Eosinophils Absolute 0.3 0.0 - 0.7 K/uL   Basophils Relative 0 %   Basophils Absolute 0.0 0.0 - 0.1 K/uL    Comment: Performed at [facility], [address], [place], [place] [address]  Glucose, capillary     Status: Abnormal   Collection Time: 05/05/18  7:47 AM  Result Value Ref Range   Glucose-Capillary 160 (H) 65 - 99 mg/dL  Glucose, capillary     Status: Abnormal   Collection Time: 05/05/18 11:16 AM  Result Value Ref Range   Glucose-Capillary 166 (H) 65 - 99 mg/dL  Glucose,  capillary     Status: Abnormal   Collection Time: 05/05/18  4:19 PM  Result Value Ref Range   Glucose-Capillary 184 (H) 65 - 99 mg/dL   Comment 1 Notify RN    Comment 2 Document in Chart   Urinalysis, Complete w Microscopic     Status: None   Collection Time: 05/05/18  4:57 PM  Result Value Ref Range   Color, Urine YELLOW YELLOW   APPearance CLEAR CLEAR   Specific Gravity, Urine 1.016 1.005 - 1.030   pH 7.0 5.0 - 8.0   Glucose, UA NEGATIVE NEGATIVE mg/dL   Hgb urine dipstick NEGATIVE NEGATIVE   Bilirubin Urine NEGATIVE NEGATIVE   Ketones, ur NEGATIVE NEGATIVE mg/dL   Protein, ur NEGATIVE NEGATIVE mg/dL   Nitrite NEGATIVE NEGATIVE   Leukocytes, UA NEGATIVE NEGATIVE   RBC / HPF 0-5 0 - 5 RBC/hpf   WBC, UA 0-5 0 - 5 WBC/hpf   Bacteria, UA NONE SEEN NONE SEEN   Squamous Epithelial / LPF 0-5 0 - 5   Mucus PRESENT    Hyaline Casts,  UA PRESENT     Comment: Performed at [facility], [address] [place], [place] [address]  Culture, blood (Routine X 2) w Reflex to ID Panel     Status: None (Preliminary result)   Collection Time: 05/05/18  5:24 PM  Result Value Ref Range   Specimen Description BLOOD RIGHT ARM    Special Requests      BOTTLES DRAWN AEROBIC AND ANAEROBIC Blood Culture adequate volume   Culture      NO GROWTH < 24 HOURS Performed at [facility], [address] [place], [place] [address]    Report Status PENDING   Culture, blood (Routine X 2) w Reflex to ID Panel     Status: None (Preliminary result)   Collection Time: 05/05/18  5:24 PM  Result Value Ref Range   Specimen Description BLOOD RIGHT FOREARM    Special Requests      BOTTLES DRAWN AEROBIC AND ANAEROBIC Blood Culture adequate volume   Culture      NO GROWTH < 24 HOURS Performed at [facility], [address], [place], [place] [address]    Report Status PENDING   Glucose, capillary     Status: Abnormal   Collection Time: 05/05/18  8:25 PM  Result Value Ref Range   Glucose-Capillary 187 (H) 65 - 99 mg/dL   Comment 1 Notify RN   Glucose, capillary     Status: Abnormal   Collection Time: 05/06/18 12:25 AM  Result Value Ref Range   Glucose-Capillary 158 (H) 65 - 99 mg/dL   Comment 1 Notify RN   Glucose, capillary     Status: Abnormal   Collection Time: 05/06/18  4:46 AM  Result Value Ref Range   Glucose-Capillary 150 (H) 65 - 99 mg/dL  CBC with Differential     Status: Abnormal   Collection Time: 05/06/18  5:04 AM  Result Value Ref Range   WBC 18.3 (H) 4.0 - 10.5 K/uL   RBC 3.88 3.87 - 5.11 MIL/uL   Hemoglobin 12.1 12.0 - 15.0 g/dL   HCT 38.8 36.0 - 46.0 %   MCV 100.0 78.0 - 100.0 fL   MCH 31.2 26.0 - 34.0 pg   MCHC 31.2 30.0 - 36.0 g/dL   RDW 13.3 11.5 - 15.5 %   Platelets 216 150 - 400 K/uL   Neutrophils Relative % 88 %   Neutro Abs 16.2 (H) 1.7 -  7.7 K/uL   Lymphocytes Relative 5 %   Lymphs Abs 0.9 0.7 - 4.0 K/uL    Monocytes Relative 5 %   Monocytes Absolute 0.9 0.1 - 1.0 K/uL   Eosinophils Relative 2 %   Eosinophils Absolute 0.4 0.0 - 0.7 K/uL   Basophils Relative 0 %   Basophils Absolute 0.0 0.0 - 0.1 K/uL    Comment: Performed at [facility], [address], [place], [place] [address]  Basic metabolic panel     Status: Abnormal   Collection Time: 05/06/18  5:04 AM  Result Value Ref Range   Sodium 138 135 - 145 mmol/L   Potassium 3.9 3.5 - 5.1 mmol/L   Chloride 94 (L) 101 - 111 mmol/L   CO2 37 (H) 22 - 32 mmol/L   Glucose, Bld 171 (H) 65 - 99 mg/dL   BUN 19 6 - 20 mg/dL   Creatinine, Ser 0.57 0.44 - 1.00 mg/dL   Calcium 9.5 8.9 - 10.3 mg/dL   GFR calc non Af Amer >60 >60 mL/min   GFR calc Af Amer >60 >60 mL/min    Comment: (NOTE) The eGFR has been calculated using the CKD EPI equation. This calculation has not been validated in all clinical situations. eGFR's persistently <60 mL/min signify possible Chronic Kidney Disease.    Anion gap 7 5 - 15    Comment: Performed at [facility], [address] [place], [place] [address]    ABGS No results for input(s): PHART, PO2ART, TCO2, HCO3 in the last 72 hours.  Invalid input(s): PCO2 CULTURES Recent Results (from the past 240 hour(s))  Urine culture     Status: None   Collection Time: 04/25/2018  8:05 PM  Result Value Ref Range Status   Specimen Description   Final    URINE, CATHETERIZED Performed at [facility], [address], [place], [place] [address]    Special Requests   Final    Normal Performed at [facility], [address], [place], [place] [address]    Culture   Final    NO GROWTH Performed at [facility], [place] [address], [place], [place] [address]    Report Status 05/02/2018 FINAL  Final  Blood Culture (routine x 2)     Status: Abnormal   Collection Time: 04/17/2018  8:09 PM  Result Value Ref Range Status   Specimen Description   Final    BLOOD LEFT HAND Performed at [facility], [address],  [place], [place] [address]    Special Requests   Final    BOTTLES DRAWN AEROBIC AND ANAEROBIC Blood Culture results may not be optimal due to an inadequate volume of blood received in culture bottles Performed at [facility], [address], [place], [place] [address]    Culture  Setup Time   Final    GRAM POSITIVE COCCI Gram Stain Report Called to,Read Back By and Verified With: MOTLEY,J. AT 7001 ON 05/01/2018 BY EVA IN BOTH AEROBIC AND ANAEROBIC BOTTLES Performed at [facility], READ BACK BY AND VERIFIED WITHLynwood Dawley RN [id number] 05/01/18 A BROWNING    Culture (A)  Final    STAPHYLOCOCCUS SPECIES (COAGULASE NEGATIVE) THE SIGNIFICANCE OF ISOLATING THIS ORGANISM FROM A SINGLE SET OF BLOOD CULTURES WHEN MULTIPLE SETS ARE DRAWN IS UNCERTAIN. PLEASE NOTIFY THE MICROBIOLOGY DEPARTMENT WITHIN ONE WEEK IF SPECIATION AND SENSITIVITIES ARE REQUIRED. Performed at [facility], [place] [address], [place], [place] [address]  Report Status 05/04/2018 FINAL  Final  Blood Culture ID Panel (Reflexed)     Status: Abnormal   Collection Time: 04/23/2018  8:09 PM  Result Value Ref Range Status   Enterococcus species NOT DETECTED NOT DETECTED Final   Listeria monocytogenes NOT DETECTED NOT DETECTED Final   Staphylococcus species DETECTED (A) NOT DETECTED Final    Comment: Methicillin (oxacillin) resistant coagulase negative staphylococcus. Possible blood culture contaminant (unless isolated from more than one blood culture draw or clinical case suggests pathogenicity). No antibiotic treatment is indicated for blood  culture contaminants. CRITICAL RESULT CALLED TO, READ BACK BY AND VERIFIED WITHLynwood Dawley RN [id number] 05/01/18 A BROWNING    Staphylococcus aureus NOT DETECTED NOT DETECTED Final   Methicillin resistance DETECTED (A) NOT DETECTED Final    Comment: CRITICAL RESULT CALLED TO, READ BACK BY AND VERIFIED WITHLynwood Dawley RN 2146 05/01/18 A BROWNING    Streptococcus species  NOT DETECTED NOT DETECTED Final   Streptococcus agalactiae NOT DETECTED NOT DETECTED Final   Streptococcus pneumoniae NOT DETECTED NOT DETECTED Final   Streptococcus pyogenes NOT DETECTED NOT DETECTED Final   Acinetobacter baumannii NOT DETECTED NOT DETECTED Final   Enterobacteriaceae species NOT DETECTED NOT DETECTED Final   Enterobacter cloacae complex NOT DETECTED NOT DETECTED Final   Escherichia coli NOT DETECTED NOT DETECTED Final   Klebsiella oxytoca NOT DETECTED NOT DETECTED Final   Klebsiella pneumoniae NOT DETECTED NOT DETECTED Final   Proteus species NOT DETECTED NOT DETECTED Final   Serratia marcescens NOT DETECTED NOT DETECTED Final   Haemophilus influenzae NOT DETECTED NOT DETECTED Final   Neisseria meningitidis NOT DETECTED NOT DETECTED Final   Pseudomonas aeruginosa NOT DETECTED NOT DETECTED Final   Candida albicans NOT DETECTED NOT DETECTED Final   Candida glabrata NOT DETECTED NOT DETECTED Final   Candida krusei NOT DETECTED NOT DETECTED Final   Candida parapsilosis NOT DETECTED NOT DETECTED Final   Candida tropicalis NOT DETECTED NOT DETECTED Final    Comment: Performed at [facility], [place]. [address], [place], [place] [address]  Blood Culture (routine x 2)     Status: None   Collection Time: 04/24/2018  9:20 PM  Result Value Ref Range Status   Specimen Description BLOOD LEFT ARM  Final   Special Requests   Final    BOTTLES DRAWN AEROBIC AND ANAEROBIC Blood Culture adequate volume   Culture   Final    NO GROWTH 5 DAYS Performed at [facility], [address] [place], [place] [address]    Report Status 05/05/2018 FINAL  Final  MRSA PCR Screening     Status: None   Collection Time: 05/08/2018 11:40 PM  Result Value Ref Range Status   MRSA by PCR NEGATIVE NEGATIVE Final    Comment:        The GeneXpert MRSA Assay (FDA approved for NASAL specimens only), is one component of a comprehensive MRSA colonization surveillance program. It is not intended to  diagnose MRSA infection nor to guide or monitor treatment for MRSA infections. Performed at [facility], [address], [place], [place] [address]   Culture, blood (Routine X 2) w Reflex to ID Panel     Status: None (Preliminary result)   Collection Time: 05/05/18  5:24 PM  Result Value Ref Range Status   Specimen Description BLOOD RIGHT ARM  Final   Special Requests   Final    BOTTLES DRAWN AEROBIC AND ANAEROBIC Blood Culture adequate volume   Culture   Final  NO GROWTH < 24 HOURS Performed at [facility], [address] [place], [place] [address]    Report Status PENDING  Incomplete  Culture, blood (Routine X 2) w Reflex to ID Panel     Status: None (Preliminary result)   Collection Time: 05/05/18  5:24 PM  Result Value Ref Range Status   Specimen Description BLOOD RIGHT FOREARM  Final   Special Requests   Final    BOTTLES DRAWN AEROBIC AND ANAEROBIC Blood Culture adequate volume   Culture   Final    NO GROWTH < 24 HOURS Performed at [facility], [address] [place], [place] [address]    Report Status PENDING  Incomplete   Studies/Results: Dg Chest Port 1 View  Result Date: 05/05/2018 CLINICAL DATA:  Respiratory failure.  Follow-up exam. EXAM: PORTABLE CHEST 1 VIEW COMPARISON:  CT and chest radiographs, 04/17/2018. FINDINGS: There has been interval worsening in lung aeration since the prior exams. There is new opacity in the right upper lobe with elevation of the minor fissure. There is increased left perihilar opacity. There has been an increase in opacity at both lung bases, some of which is due to new bilateral effusions. New left PICC has its tip near the caval atrial junction, well positioned. No pneumothorax. IMPRESSION: 1. There has been interval worsening in lung aeration since prior study. This is likely combination of worsened multifocal pneumonia with the development of bilateral pleural effusions. 2. New left PICC is well positioned. Electronically Signed   By:  Lajean Manes M.D.   On: 05/05/2018 09:43    Medications:  Prior to Admission:  Medications Prior to Admission  Medication Sig Dispense Refill Last Dose  . ALPRAZolam (XANAX) 0.5 MG tablet Take 1 tablet (0.5 mg total) by mouth 3 (three) times daily as needed for anxiety.  0 04/28/2018 at Unknown time  . calcium carbonate, dosed in mg elemental calcium, 1250 (500 CA) MG/5ML Place 5 mLs (500 mg of elemental calcium total) into feeding tube 2 (two) times daily with a meal. 450 mL  Past Week at Unknown time  . gabapentin (NEURONTIN) 300 MG capsule Take 1 capsule by mouth 3 (three) times daily.  11 05/02/2018 at Unknown time  . magnesium citrate SOLN 1 cc twice daily   Past Week at Unknown time  . meclizine (ANTIVERT) 25 MG tablet Take 25 mg by mouth at bedtime.   05/03/2018 at Unknown time  . Nutritional Supplements (FEEDING SUPPLEMENT, JEVITY 1.2 CAL,) LIQD Place 1,000 mLs into feeding tube daily.  0 04/28/2018 at Unknown time  . nystatin cream (MYCOSTATIN) APPLY TO AFFECTED AREAS TWICE DAILY. USE WITH TRIAMCINOLONE CREAM. 15 g 0 Past Week at Unknown time  . Potassium Chloride 40 MEQ/15ML (20%) SOLN Take 15 mLs by mouth 2 (two) times daily.   Past Week at Unknown time  . ranitidine (ZANTAC) 15 MG/ML syrup Take 150 mg by mouth 2 (two) times daily.    04/16/2018 at Unknown time  . silver nitrate applicators 73-71 % applicator Apply topically daily. Apply silver nitrate solution to granulation tissue 3 times a week as directed until it is completely resolved. 100 each 0 Past Week at Unknown time  . triamcinolone cream (KENALOG) 0.1 % APPLY TO AFFECTED AREAS TWICE DAILY. USE WITH NYSTATIN CREAM. 15 g 0 Past Week at Unknown time   Scheduled: . acetylcysteine  3 mL Nebulization Q6H  . chlorhexidine  15 mL Mouth Rinse BID  . Chlorhexidine Gluconate Cloth  6 each Topical Daily  .  Chlorhexidine Gluconate Cloth  6 each Topical Daily  . famotidine  20 mg Per Tube Daily  . feeding supplement (PRO-STAT SUGAR  FREE 64)  30 mL Per Tube Daily  . gabapentin  300 mg Per Tube TID  . guaiFENesin  10 mL Per Tube Q6H  . ipratropium-albuterol  3 mL Nebulization Q6H  . meclizine  25 mg Per Tube QHS  . mouth rinse  15 mL Mouth Rinse q12n4p  . nystatin-triamcinolone   Topical BID  . potassium chloride  40 mEq Per Tube BID  . scopolamine  1 patch Transdermal Q72H  . senna-docusate  1 tablet Per Tube QHS  . sodium chloride flush  10-40 mL Intracatheter Q12H  . sodium chloride flush  10-40 mL Intracatheter Q12H   Continuous: . sodium chloride 10 mL/hr at 05/06/18 0400  . diltiazem (CARDIZEM) infusion Stopped (05/06/18 0017)  . feeding supplement (JEVITY 1.2 CAL) 55 mL/hr at 05/06/18 0400  . piperacillin-tazobactam (ZOSYN)  IV 3.375 g (05/06/18 0518)   [id number] **OR** acetaminophen, albuterol, bisacodyl, LORazepam, morphine injection, ondansetron **OR** ondansetron (ZOFRAN) IV, sodium chloride flush, sodium chloride flush  Assesment: She was admitted with sepsis from pneumonia.  This is likely aspiration pneumonia.  She has initially improved but clearly is deteriorating over the last 72 hours.  I think she has ongoing aspiration.  She has significant air hunger.  I do not think she is going to survive  She is had episodes of atrial fib and required treatment yesterday  She has loose stools.  She has severe dysphagia and has chronic aspiration  She has malnutrition Principal Problem:   Sepsis due to undetermined organism [facility]) Active Problems:   Dysphagia, pharyngoesophageal phase   Malnutrition of moderate degree (HCC)   Lactic acidosis   Hyponatremia   Elevated AST (SGOT)   Acute respiratory failure (HCC)   Sinus tachycardia   Hyperglycemia   Arterial hypotension   Palliative care by specialist   Goals of care, counseling/discussion   DNR (do not resuscitate) discussion   Sepsis ([place])   Aspiration pneumonia of both lower lobes due to gastric secretions [facility])    Plan: She has  elected comfort care    LOS: 6 days   Gregery Walberg L 05/06/2018, 7:48 AM

## 2018-05-06 NOTE — Progress Notes (Signed)
Palliative: Mrs. Nancy Horn is resting quietly in bed.  She appears acutely ill, weaker since I last saw her.  She is having trouble speaking and tells me that she is having trouble breathing.  Nursing staff that she has decided along with her son that she would like comfort measures only.  She shares that she would like to have morphine to ease her work of breathing and her passing. Conference with respiratory therapist related to plan of care.  RT shares that Mrs. Nancy Horn desires no further breathing treatments.  RT shares that she did do well with NTS and would accept that again if needed. Mrs. Nancy Horn has transition to full comfort care.  I am unsure if she will experience a hospital death, but she appears very weak and frail, increased heart rate low oxygenation.  If she seems stable tomorrow, we will discuss transfer to residential hospice for comfort and dignity at end of life. Orders related to comfort care in place. 40 minutes Lillia Carmelasha Dove, NP Palliative Medicine Team Team Phone # [phone]

## 2018-05-07 LAB — URINE CULTURE: CULTURE: NO GROWTH

## 2018-05-10 LAB — CULTURE, BLOOD (ROUTINE X 2)
CULTURE: NO GROWTH
Culture: NO GROWTH
SPECIAL REQUESTS: ADEQUATE
SPECIAL REQUESTS: ADEQUATE

## 2018-05-13 NOTE — Care Management Note (Signed)
Case Management Note  Patient Details  Name: Nancy Horn MRN: [id number] Date of Birth: December 05, 1924   If discussed at Long Length of Stay Meetings, dates discussed:  21-Jun-2018  Additional Comments:  Aadyn Buchheit, Chrystine OilerSharley Diane, RN 21-Jun-2018, 12:16 PM

## 2018-05-13 NOTE — Progress Notes (Signed)
Palliative: Mrs. Nancy Horn is resting quietly in bed.  She is sleeping soundly, and does not rouse when I enter the room.  I do not try to wake her, as she seems comfortable.  She does have a moan upon exhalation occasionally.  There is no family at bedside at this time.   Hospital death anticipated, likely in the next 24 hours.  At this point, Mrs. Nancy Horn seems unstable for transfer to residential hospice. Conversation with nursing staff related to comfort measures, plan of care, oxygen. 40 minutes  Lillia Carmelasha Chia Mowers, NP Palliative Medicine Team Team Phone # [phone]

## 2018-05-13 NOTE — Progress Notes (Signed)
Subjective: She is on comfort care.  She is comfortable in appearance.  She is had medication so she is sleeping soundly.  Objective: Vital signs in last 24 hours: Temp:  [98.6 F (37 C)-100.9 F (38.3 C)] 99.3 F (37.4 C) (06/25 0700) Pulse Rate:  [84-105] 84 (06/25 0700) Resp:  [13-25] 14 (06/25 0700) BP: (122-126)/(65) 126/65 (06/25 0425) SpO2:  [90 %-97 %] 90 % (06/25 0700) Weight change:  Last BM Date: 05/06/18  Intake/Output from previous day: 06/24 0701 - 06/25 0700 In: 496.7 [I.V.:75; NG/GT:421.7] Out: 1300 [Urine:1300]  PHYSICAL EXAM General appearance: Sleeping Resp: rhonchi bilaterally Cardio: regular rate and rhythm, S1, S2 normal, no murmur, click, rub or gallop GI: soft, non-tender; bowel sounds normal; no masses,  no organomegaly Extremities: extremities normal, atraumatic, no cyanosis or edema  Lab Results:  Results for orders placed or performed during the hospital encounter of 04/23/2018 (from the past 48 hour(s))  Glucose, capillary     Status: Abnormal   Collection Time: 05/05/18 11:16 AM  Result Value Ref Range   Glucose-Capillary 166 (H) 65 - 99 mg/dL  Glucose, capillary     Status: Abnormal   Collection Time: 05/05/18  4:19 PM  Result Value Ref Range   Glucose-Capillary 184 (H) 65 - 99 mg/dL   Comment 1 Notify RN    Comment 2 Document in Chart   Urinalysis, Complete w Microscopic     Status: None   Collection Time: 05/05/18  4:57 PM  Result Value Ref Range   Color, Urine YELLOW YELLOW   APPearance CLEAR CLEAR   Specific Gravity, Urine 1.016 1.005 - 1.030   pH 7.0 5.0 - 8.0   Glucose, UA NEGATIVE NEGATIVE mg/dL   Hgb urine dipstick NEGATIVE NEGATIVE   Bilirubin Urine NEGATIVE NEGATIVE   Ketones, ur NEGATIVE NEGATIVE mg/dL   Protein, ur NEGATIVE NEGATIVE mg/dL   Nitrite NEGATIVE NEGATIVE   Leukocytes, UA NEGATIVE NEGATIVE   RBC / HPF 0-5 0 - 5 RBC/hpf   WBC, UA 0-5 0 - 5 WBC/hpf   Bacteria, UA NONE SEEN NONE SEEN   Squamous Epithelial / LPF  0-5 0 - 5   Mucus PRESENT    Hyaline Casts, UA PRESENT     Comment: Performed at [facility], [address], [place], [place] [address]  Culture, blood (Routine X 2) w Reflex to ID Panel     Status: None (Preliminary result)   Collection Time: 05/05/18  5:24 PM  Result Value Ref Range   Specimen Description BLOOD RIGHT ARM    Special Requests      BOTTLES DRAWN AEROBIC AND ANAEROBIC Blood Culture adequate volume   Culture      NO GROWTH 2 DAYS Performed at [facility] [address] [place], [place] [address]    Report Status PENDING   Culture, blood (Routine X 2) w Reflex to ID Panel     Status: None (Preliminary result)   Collection Time: 05/05/18  5:24 PM  Result Value Ref Range   Specimen Description BLOOD RIGHT FOREARM    Special Requests      BOTTLES DRAWN AEROBIC AND ANAEROBIC Blood Culture adequate volume   Culture      NO GROWTH 2 DAYS Performed at [facility], [address], [place], [place] [address]    Report Status PENDING   Glucose, capillary     Status: Abnormal   Collection Time: 05/05/18  8:25 PM  Result Value Ref Range   Glucose-Capillary 187 (H) 65 -  99 mg/dL   Comment 1 Notify RN   Glucose, capillary     Status: Abnormal   Collection Time: 05/06/18 12:25 AM  Result Value Ref Range   Glucose-Capillary 158 (H) 65 - 99 mg/dL   Comment 1 Notify RN   Glucose, capillary     Status: Abnormal   Collection Time: 05/06/18  4:46 AM  Result Value Ref Range   Glucose-Capillary 150 (H) 65 - 99 mg/dL  CBC with Differential     Status: Abnormal   Collection Time: 05/06/18  5:04 AM  Result Value Ref Range   WBC 18.3 (H) 4.0 - 10.5 K/uL   RBC 3.88 3.87 - 5.11 MIL/uL   Hemoglobin 12.1 12.0 - 15.0 g/dL   HCT 38.8 36.0 - 46.0 %   MCV 100.0 78.0 - 100.0 fL   MCH 31.2 26.0 - 34.0 pg   MCHC 31.2 30.0 - 36.0 g/dL   RDW 13.3 11.5 - 15.5 %   Platelets 216 150 - 400 K/uL   Neutrophils Relative % 88 %   Neutro Abs 16.2 (H) 1.7 - 7.7 K/uL   Lymphocytes Relative 5 %    Lymphs Abs 0.9 0.7 - 4.0 K/uL   Monocytes Relative 5 %   Monocytes Absolute 0.9 0.1 - 1.0 K/uL   Eosinophils Relative 2 %   Eosinophils Absolute 0.4 0.0 - 0.7 K/uL   Basophils Relative 0 %   Basophils Absolute 0.0 0.0 - 0.1 K/uL    Comment: Performed at [facility], [address] [place], [place] [address]  Basic metabolic panel     Status: Abnormal   Collection Time: 05/06/18  5:04 AM  Result Value Ref Range   Sodium 138 135 - 145 mmol/L   Potassium 3.9 3.5 - 5.1 mmol/L   Chloride 94 (L) 101 - 111 mmol/L   CO2 37 (H) 22 - 32 mmol/L   Glucose, Bld 171 (H) 65 - 99 mg/dL   BUN 19 6 - 20 mg/dL   Creatinine, Ser 0.57 0.44 - 1.00 mg/dL   Calcium 9.5 8.9 - 10.3 mg/dL   GFR calc non Af Amer >60 >60 mL/min   GFR calc Af Amer >60 >60 mL/min    Comment: (NOTE) The eGFR has been calculated using the CKD EPI equation. This calculation has not been validated in all clinical situations. eGFR's persistently <60 mL/min signify possible Chronic Kidney Disease.    Anion gap 7 5 - 15    Comment: Performed at [facility], [address], [place], [place] [address]    ABGS No results for input(s): PHART, PO2ART, TCO2, HCO3 in the last 72 hours.  Invalid input(s): PCO2 CULTURES Recent Results (from the past 240 hour(s))  Urine culture     Status: None   Collection Time: 04/17/2018  8:05 PM  Result Value Ref Range Status   Specimen Description   Final    URINE, CATHETERIZED Performed at [facility], [address] [place], [place] [address]    Special Requests   Final    Normal Performed at [facility], [address], [place], [place] [address]    Culture   Final    NO GROWTH Performed at [facility], [facility] [address], [place], [place] [address]    Report Status 05/02/2018 FINAL  Final  Blood Culture (routine x 2)     Status: Abnormal   Collection Time: 04/16/2018  8:09 PM  Result Value Ref Range Status   Specimen Description   Final  BLOOD LEFT HAND Performed at [facility], [address] [place], [place] [address]    Special Requests   Final    BOTTLES DRAWN AEROBIC AND ANAEROBIC Blood Culture results may not be optimal due to an inadequate volume of blood received in culture bottles Performed at [facility], [address], [place], [place] [address]    Culture  Setup Time   Final    GRAM POSITIVE COCCI Gram Stain Report Called to,Read Back By and Verified With: MOTLEY,J. AT 6950 ON 05/01/2018 BY EVA IN BOTH AEROBIC AND ANAEROBIC BOTTLES Performed at [place], READ BACK BY AND VERIFIED WITHLynwood Dawley RN [id number] 05/01/18 A BROWNING    Culture (A)  Final    STAPHYLOCOCCUS SPECIES (COAGULASE NEGATIVE) THE SIGNIFICANCE OF ISOLATING THIS ORGANISM FROM A SINGLE SET OF BLOOD CULTURES WHEN MULTIPLE SETS ARE DRAWN IS UNCERTAIN. PLEASE NOTIFY THE MICROBIOLOGY DEPARTMENT WITHIN ONE WEEK IF SPECIATION AND SENSITIVITIES ARE REQUIRED. Performed at [facility], [place] [address] [place], [place] [address]    Report Status 05/04/2018 FINAL  Final  Blood Culture ID Panel (Reflexed)     Status: Abnormal   Collection Time: 05/08/2018  8:09 PM  Result Value Ref Range Status   Enterococcus species NOT DETECTED NOT DETECTED Final   Listeria monocytogenes NOT DETECTED NOT DETECTED Final   Staphylococcus species DETECTED (A) NOT DETECTED Final    Comment: Methicillin (oxacillin) resistant coagulase negative staphylococcus. Possible blood culture contaminant (unless isolated from more than one blood culture draw or clinical case suggests pathogenicity). No antibiotic treatment is indicated for blood  culture contaminants. CRITICAL RESULT CALLED TO, READ BACK BY AND VERIFIED WITHLynwood Dawley RN [id number] 05/01/18 A BROWNING    Staphylococcus aureus NOT DETECTED NOT DETECTED Final   Methicillin resistance DETECTED (A) NOT DETECTED Final    Comment: CRITICAL RESULT CALLED TO, READ BACK BY AND VERIFIED WITHLynwood Dawley RN [id number] 05/01/18 A BROWNING     Streptococcus species NOT DETECTED NOT DETECTED Final   Streptococcus agalactiae NOT DETECTED NOT DETECTED Final   Streptococcus pneumoniae NOT DETECTED NOT DETECTED Final   Streptococcus pyogenes NOT DETECTED NOT DETECTED Final   Acinetobacter baumannii NOT DETECTED NOT DETECTED Final   Enterobacteriaceae species NOT DETECTED NOT DETECTED Final   Enterobacter cloacae complex NOT DETECTED NOT DETECTED Final   Escherichia coli NOT DETECTED NOT DETECTED Final   Klebsiella oxytoca NOT DETECTED NOT DETECTED Final   Klebsiella pneumoniae NOT DETECTED NOT DETECTED Final   Proteus species NOT DETECTED NOT DETECTED Final   Serratia marcescens NOT DETECTED NOT DETECTED Final   Haemophilus influenzae NOT DETECTED NOT DETECTED Final   Neisseria meningitidis NOT DETECTED NOT DETECTED Final   Pseudomonas aeruginosa NOT DETECTED NOT DETECTED Final   Candida albicans NOT DETECTED NOT DETECTED Final   Candida glabrata NOT DETECTED NOT DETECTED Final   Candida krusei NOT DETECTED NOT DETECTED Final   Candida parapsilosis NOT DETECTED NOT DETECTED Final   Candida tropicalis NOT DETECTED NOT DETECTED Final    Comment: Performed at [facility], [place]. [address], [place], [place] [address]  Blood Culture (routine x 2)     Status: None   Collection Time: 04/29/2018  9:20 PM  Result Value Ref Range Status   Specimen Description BLOOD LEFT ARM  Final   Special Requests   Final    BOTTLES DRAWN AEROBIC AND ANAEROBIC Blood Culture adequate volume   Culture   Final  NO GROWTH 5 DAYS Performed at [facility], [address] [place], [place] [address]    Report Status 05/05/2018 FINAL  Final  MRSA PCR Screening     Status: None   Collection Time: 05/03/2018 11:40 PM  Result Value Ref Range Status   MRSA by PCR NEGATIVE NEGATIVE Final    Comment:        The GeneXpert MRSA Assay (FDA approved for NASAL specimens only), is one component of a comprehensive MRSA colonization surveillance program. It is  not intended to diagnose MRSA infection nor to guide or monitor treatment for MRSA infections. Performed at [facility], [address], [place], [place] [address]   Culture, blood (Routine X 2) w Reflex to ID Panel     Status: None (Preliminary result)   Collection Time: 05/05/18  5:24 PM  Result Value Ref Range Status   Specimen Description BLOOD RIGHT ARM  Final   Special Requests   Final    BOTTLES DRAWN AEROBIC AND ANAEROBIC Blood Culture adequate volume   Culture   Final    NO GROWTH 2 DAYS Performed at [facility], [address], [place], [place] [address]    Report Status PENDING  Incomplete  Culture, blood (Routine X 2) w Reflex to ID Panel     Status: None (Preliminary result)   Collection Time: 05/05/18  5:24 PM  Result Value Ref Range Status   Specimen Description BLOOD RIGHT FOREARM  Final   Special Requests   Final    BOTTLES DRAWN AEROBIC AND ANAEROBIC Blood Culture adequate volume   Culture   Final    NO GROWTH 2 DAYS Performed at [facility], [address], [place],  [address]    Report Status PENDING  Incomplete   Studies/Results: No results found.  Medications:  Prior to Admission:  Medications Prior to Admission  Medication Sig Dispense Refill Last Dose  . ALPRAZolam (XANAX) 0.5 MG tablet Take 1 tablet (0.5 mg total) by mouth 3 (three) times daily as needed for anxiety.  0 04/17/2018 at Unknown time  . calcium carbonate, dosed in mg elemental calcium, 1250 (500 CA) MG/5ML Place 5 mLs (500 mg of elemental calcium total) into feeding tube 2 (two) times daily with a meal. 450 mL  Past Week at Unknown time  . gabapentin (NEURONTIN) 300 MG capsule Take 1 capsule by mouth 3 (three) times daily.  11 05/10/2018 at Unknown time  . magnesium citrate SOLN 1 cc twice daily   Past Week at Unknown time  . meclizine (ANTIVERT) 25 MG tablet Take 25 mg by mouth at bedtime.   04/13/2018 at Unknown time  . Nutritional Supplements (FEEDING SUPPLEMENT, JEVITY 1.2 CAL,)  LIQD Place 1,000 mLs into feeding tube daily.  0 04/20/2018 at Unknown time  . nystatin cream (MYCOSTATIN) APPLY TO AFFECTED AREAS TWICE DAILY. USE WITH TRIAMCINOLONE CREAM. 15 g 0 Past Week at Unknown time  . Potassium Chloride 40 MEQ/15ML (20%) SOLN Take 15 mLs by mouth 2 (two) times daily.   Past Week at Unknown time  . ranitidine (ZANTAC) 15 MG/ML syrup Take 150 mg by mouth 2 (two) times daily.    04/20/2018 at Unknown time  . silver nitrate applicators 99-37 % applicator Apply topically daily. Apply silver nitrate solution to granulation tissue 3 times a week as directed until it is completely resolved. 100 each 0 Past Week at Unknown time  . triamcinolone cream (KENALOG) 0.1 % APPLY TO AFFECTED AREAS TWICE DAILY. USE WITH NYSTATIN  CREAM. 15 g 0 Past Week at Unknown time   Scheduled: . scopolamine  1 patch Transdermal Q72H  . sodium chloride flush  10-40 mL Intracatheter Q12H   Continuous: . sodium chloride Stopped (05/06/18 1030)   RUO:[id number] **OR** acetaminophen, atropine, bisacodyl, LORazepam, morphine injection, nystatin-triamcinolone, ondansetron **OR** ondansetron (ZOFRAN) IV, polyvinyl alcohol, sodium chloride flush  Assesment: She was admitted with sepsis related to aspiration pneumonia.  She has had acute hypoxic respiratory failure.  She initially improved and then got much worse again and I think she has ongoing aspiration.  She has elected comfort care.  She does appear to be comfortable now. Principal Problem:   Sepsis due to undetermined organism [facility]) Active Problems:   Dysphagia, pharyngoesophageal phase   Malnutrition of moderate degree (HCC)   Lactic acidosis   Hyponatremia   Elevated AST (SGOT)   Acute respiratory failure (HCC)   Sinus tachycardia   Hyperglycemia   Arterial hypotension   Palliative care by specialist   Goals of care, counseling/discussion   DNR (do not resuscitate) discussion   Sepsis ([place])   Aspiration pneumonia of both lower lobes due  to gastric secretions ([place])    Plan: Continue treatments.  Palliative care help noted and appreciated    LOS: 7 days   Brittne Kawasaki L 05-22-2018, 8:42 AM

## 2018-05-13 NOTE — Discharge Summary (Signed)
Physician Discharge Summary  Patient ID: EIMI VINEY MRN: [id number] DOB/AGE: [birth year] 84 y.o. Primary Care Physician:Deegan Valentino, Ramon Dredge, MD Admit date: 05-11-2018 Discharge date: 05/08/2018    Discharge Diagnoses:   Principal Problem:   Sepsis due to undetermined organism [facility]) Active Problems:   Dysphagia, pharyngoesophageal phase   Malnutrition of moderate degree (HCC)   Lactic acidosis   Hyponatremia   Elevated AST (SGOT)   Acute respiratory failure (HCC)   Sinus tachycardia   Hyperglycemia   Arterial hypotension   Palliative care by specialist   Goals of care, counseling/discussion   DNR (do not resuscitate) discussion   Sepsis (HCC)   Aspiration pneumonia of both lower lobes due to gastric secretions (HCC)   Allergies as of 04/24/2018   No Known Allergies     Medication List    ASK your doctor about these medications   ALPRAZolam 0.5 MG tablet Commonly known as:  XANAX Take 1 tablet (0.5 mg total) by mouth 3 (three) times daily as needed for anxiety.   calcium carbonate (dosed in mg elemental calcium) 1250 MG/5ML Place 5 mLs (500 mg of elemental calcium total) into feeding tube 2 (two) times daily with a meal.   feeding supplement (JEVITY 1.2 CAL) Liqd Place 1,000 mLs into feeding tube daily.   gabapentin 300 MG capsule Commonly known as:  NEURONTIN Take 1 capsule by mouth 3 (three) times daily.   magnesium citrate Soln 1 cc twice daily   meclizine 25 MG tablet Commonly known as:  ANTIVERT Take 25 mg by mouth at bedtime.   nystatin cream Commonly known as:  MYCOSTATIN APPLY TO AFFECTED AREAS TWICE DAILY. USE WITH TRIAMCINOLONE CREAM.   Potassium Chloride 40 MEQ/15ML (20%) Soln Take 15 mLs by mouth 2 (two) times daily.   ranitidine 15 MG/ML syrup Commonly known as:  ZANTAC Take 150 mg by mouth 2 (two) times daily.   silver nitrate applicators 75-25 % applicator Apply topically daily. Apply silver nitrate solution to granulation tissue 3  times a week as directed until it is completely resolved.   triamcinolone cream 0.1 % Commonly known as:  KENALOG APPLY TO AFFECTED AREAS TWICE DAILY. USE WITH NYSTATIN CREAM.       Discharged Condition: Deceased    Consults: Palliative care  Significant Diagnostic Studies: Ct Angio Chest Pe W Or Wo Contrast  Result Date: 05/01/2018 CLINICAL DATA:  84 y/o  F; shortness of breath and hypoxia. EXAM: CT ANGIOGRAPHY CHEST WITH CONTRAST TECHNIQUE: Multidetector CT imaging of the chest was performed using the standard protocol during bolus administration of intravenous contrast. Multiplanar CT image reconstructions and MIPs were obtained to evaluate the vascular anatomy. CONTRAST:  ISOVUE-370 IOPAMIDOL (ISOVUE-370) INJECTION 76% COMPARISON:  05-11-2018 chest radiograph FINDINGS: Cardiovascular: Satisfactory opacification of the pulmonary arteries to the segmental level. No evidence of pulmonary embolism. Normal heart size. No pericardial effusion. Calcific atherosclerosis of the aorta. Mediastinum/Nodes: Mild lower paratracheal, hilar, and AP window lymphadenopathy, likely reactive. Severe dilatation of the upper esophagus mild dilatation of the lower esophagus with fluid level. Normal thyroid gland. Lungs/Pleura: Small areas of consolidation within the bilateral upper lobes, left lower lobe lobar consolidation, and multi segmental consolidation of the right lower lobe with air bronchograms compatible with pneumonia. No pleural effusion or pneumothorax. Upper Abdomen: No acute abnormality. Musculoskeletal: No chest wall abnormality. No acute or significant osseous findings. Review of the MIP images confirms the above findings. IMPRESSION: 1. No pulmonary embolus identified. 2. Consolidation throughout the dependent left-greater-than-right lungs compatible with  pneumonia. 3. Aortic atherosclerosis. 4. Marked esophageal dilatation, question lower esophageal or gastroesophageal junction obstruction.  Electronically Signed   By: Mitzi Hansen M.D.   On: 05/01/2018 00:33   Dg Chest Port 1 View  Result Date: 05/05/2018 CLINICAL DATA:  Respiratory failure.  Follow-up exam. EXAM: PORTABLE CHEST 1 VIEW COMPARISON:  CT and chest radiographs, 05/12/2018. FINDINGS: There has been interval worsening in lung aeration since the prior exams. There is new opacity in the right upper lobe with elevation of the minor fissure. There is increased left perihilar opacity. There has been an increase in opacity at both lung bases, some of which is due to new bilateral effusions. New left PICC has its tip near the caval atrial junction, well positioned. No pneumothorax. IMPRESSION: 1. There has been interval worsening in lung aeration since prior study. This is likely combination of worsened multifocal pneumonia with the development of bilateral pleural effusions. 2. New left PICC is well positioned. Electronically Signed   By: Amie Portland M.D.   On: 05/05/2018 09:43   Dg Chest Port 1 View  Result Date: 04/25/2018 CLINICAL DATA:  Shortness of breath. EXAM: PORTABLE CHEST 1 VIEW COMPARISON:  PA and lateral chest 07/13/2015. FINDINGS: There are small bilateral pleural effusions and basilar airspace disease, worse on the left. No pneumothorax. Heart size is normal. Aortic atherosclerosis is noted. Marked dilatation of the esophagus as seen on prior exam is unchanged. IMPRESSION: Left greater than right small effusions and basilar airspace disease, likely atelectasis. Marked dilatation of the esophagus is chronic and likely due to achalasia. Electronically Signed   By: Drusilla Kanner M.D.   On: 05/06/2018 21:16    Lab Results: Basic Metabolic Panel: Recent Labs    05/06/18 0504  NA 138  K 3.9  CL 94*  CO2 37*  GLUCOSE 171*  BUN 19  CREATININE 0.57  CALCIUM 9.5   Liver Function Tests: No results for input(s): AST, ALT, ALKPHOS, BILITOT, PROT, ALBUMIN in the last 72 hours.   CBC: Recent Labs     05/06/18 0504  WBC 18.3*  NEUTROABS 16.2*  HGB 12.1  HCT 38.8  MCV 100.0  PLT 216    Recent Results (from the past 240 hour(s))  Urine culture     Status: None   Collection Time: 05/04/2018  8:05 PM  Result Value Ref Range Status   Specimen Description   Final    URINE, CATHETERIZED Performed at [facility], [address] [place], [place] [address]    Special Requests   Final    Normal Performed at [facility], [address], [place], [place] [address]    Culture   Final    NO GROWTH Performed at [facility], [address] [place], [place] [address]    Report Status 05/02/2018 FINAL  Final  Blood Culture (routine x 2)     Status: Abnormal   Collection Time: 04/26/2018  8:09 PM  Result Value Ref Range Status   Specimen Description   Final    BLOOD LEFT HAND Performed at [facility], [address] [place], [place] [address]    Special Requests   Final    BOTTLES DRAWN AEROBIC AND ANAEROBIC Blood Culture results may not be optimal due to an inadequate volume of blood received in culture bottles Performed at [facility], [address] [place], [place] [address]    Culture  Setup Time   Final    GRAM POSITIVE COCCI Gram Stain Report Called  to,Read Back By and Verified With: MOTLEY,J. AT 1542 ON 05/01/2018 BY EVA IN BOTH AEROBIC AND ANAEROBIC BOTTLES Performed at [facility] CRITICAL RESULT CALLED TO, READ BACK BY AND VERIFIED WITHArdis Rowan RN [id number] 05/01/18 A BROWNING    Culture (A)  Final    STAPHYLOCOCCUS SPECIES (COAGULASE NEGATIVE) THE SIGNIFICANCE OF ISOLATING THIS ORGANISM FROM A SINGLE SET OF BLOOD CULTURES WHEN MULTIPLE SETS ARE DRAWN IS UNCERTAIN. PLEASE NOTIFY THE MICROBIOLOGY DEPARTMENT WITHIN ONE WEEK IF SPECIATION AND SENSITIVITIES ARE REQUIRED. Performed at [facility], [address] [place], [place] [address]    Report Status 05/04/2018 FINAL  Final  Blood Culture ID Panel (Reflexed)     Status: Abnormal   Collection Time:  05/04/2018  8:09 PM  Result Value Ref Range Status   Enterococcus species NOT DETECTED NOT DETECTED Final   Listeria monocytogenes NOT DETECTED NOT DETECTED Final   Staphylococcus species DETECTED (A) NOT DETECTED Final    Comment: Methicillin (oxacillin) resistant coagulase negative staphylococcus. Possible blood culture contaminant (unless isolated from more than one blood culture draw or clinical case suggests pathogenicity). No antibiotic treatment is indicated for blood  culture contaminants. CRITICAL RESULT CALLED TO, READ BACK BY AND VERIFIED WITHArdis Rowan RN [id number] 05/01/18 A BROWNING    Staphylococcus aureus NOT DETECTED NOT DETECTED Final   Methicillin resistance DETECTED (A) NOT DETECTED Final    Comment: CRITICAL RESULT CALLED TO, READ BACK BY AND VERIFIED WITHArdis Rowan RN 2146 05/01/18 A BROWNING    Streptococcus species NOT DETECTED NOT DETECTED Final   Streptococcus agalactiae NOT DETECTED NOT DETECTED Final   Streptococcus pneumoniae NOT DETECTED NOT DETECTED Final   Streptococcus pyogenes NOT DETECTED NOT DETECTED Final   Acinetobacter baumannii NOT DETECTED NOT DETECTED Final   Enterobacteriaceae species NOT DETECTED NOT DETECTED Final   Enterobacter cloacae complex NOT DETECTED NOT DETECTED Final   Escherichia coli NOT DETECTED NOT DETECTED Final   Klebsiella oxytoca NOT DETECTED NOT DETECTED Final   Klebsiella pneumoniae NOT DETECTED NOT DETECTED Final   Proteus species NOT DETECTED NOT DETECTED Final   Serratia marcescens NOT DETECTED NOT DETECTED Final   Haemophilus influenzae NOT DETECTED NOT DETECTED Final   Neisseria meningitidis NOT DETECTED NOT DETECTED Final   Pseudomonas aeruginosa NOT DETECTED NOT DETECTED Final   Candida albicans NOT DETECTED NOT DETECTED Final   Candida glabrata NOT DETECTED NOT DETECTED Final   Candida krusei NOT DETECTED NOT DETECTED Final   Candida parapsilosis NOT DETECTED NOT DETECTED Final   Candida tropicalis NOT DETECTED NOT  DETECTED Final    Comment: Performed at [facility], [address], [place], [place] [address]  Blood Culture (routine x 2)     Status: None   Collection Time: May 04, 2018  9:20 PM  Result Value Ref Range Status   Specimen Description BLOOD LEFT ARM  Final   Special Requests   Final    BOTTLES DRAWN AEROBIC AND ANAEROBIC Blood Culture adequate volume   Culture   Final    NO GROWTH 5 DAYS Performed at [facility], [address] [place], [place] [address]    Report Status 05/05/2018 FINAL  Final  MRSA PCR Screening     Status: None   Collection Time: May 04, 2018 11:40 PM  Result Value Ref Range Status   MRSA by PCR NEGATIVE NEGATIVE Final    Comment:        The GeneXpert MRSA Assay (FDA approved for NASAL specimens only), is  one component of a comprehensive MRSA colonization surveillance program. It is not intended to diagnose MRSA infection nor to guide or monitor treatment for MRSA infections. Performed at [facility], [address] [place], [place] [address]   Culture, blood (Routine X 2) w Reflex to ID Panel     Status: None (Preliminary result)   Collection Time: 05/05/18  5:24 PM  Result Value Ref Range Status   Specimen Description BLOOD RIGHT ARM  Final   Special Requests   Final    BOTTLES DRAWN AEROBIC AND ANAEROBIC Blood Culture adequate volume   Culture   Final    NO GROWTH 2 DAYS Performed at [facility], [address] [place], [place] [address]    Report Status PENDING  Incomplete  Culture, blood (Routine X 2) w Reflex to ID Panel     Status: None (Preliminary result)   Collection Time: 05/05/18  5:24 PM  Result Value Ref Range Status   Specimen Description BLOOD RIGHT FOREARM  Final   Special Requests   Final    BOTTLES DRAWN AEROBIC AND ANAEROBIC Blood Culture adequate volume   Culture   Final    NO GROWTH 2 DAYS Performed at [facility], [address] [place], [place] [address]    Report Status PENDING  Incomplete  Culture, Urine     Status:  None   Collection Time: 05/05/18  7:50 PM  Result Value Ref Range Status   Specimen Description   Final    URINE, CLEAN CATCH Performed at [facility], [address] [place], [place] [address]    Special Requests   Final    NONE Performed at [facility], [address] [place], [place] [address]    Culture   Final    NO GROWTH Performed at [facility], [address] [place], [place] [address]    Report Status 05/09/2018 FINAL  Final     Hospital Course: This was a 84 year old who came to the emergency department because of shortness of breath.  She was found to have pneumonia presumably from aspiration and she was septic.  She was started on fluid resuscitation started on IV antibiotics and initially improved but then had significant deterioration.  She had palliative care consultation and requested DNR status which was changed from her previous full CODE STATUS.  She is known to have severe dysphagia at baseline and has been on tube feedings for about 3 years because of that.  She continued to have episodes of presumed aspiration throughout her hospitalization.  About 48 hours before her death I had a another frank discussion with her and she stated that she was tired and that she did not want to keep fighting.  She was converted to comfort care and died about 36 to 48 hours later.  Discharge Exam: Blood pressure 126/65, pulse (!) 107, temperature 99.4 F (37.4 C), resp. rate (!) 26, height 5\' 5"  (1.651 m), weight 60.7 kg (133 lb 13.1 oz), SpO2 90 %. Not applicable  Disposition: Released to funeral home      Signed: Ashe Gago L   05/08/2018, 7:32 AM

## 2018-05-13 DEATH — deceased
